# Patient Record
Sex: Female | Born: 2000 | Race: White | Hispanic: No | Smoking: Never smoker
Health system: Southern US, Community
[De-identification: ages and names within clinical notes are randomized; demographics above are authoritative.]

## PROBLEM LIST (undated history)

## (undated) DIAGNOSIS — F329 Major depressive disorder, single episode, unspecified: Secondary | ICD-10-CM

## (undated) DIAGNOSIS — F32A Depression, unspecified: Secondary | ICD-10-CM

## (undated) DIAGNOSIS — M779 Enthesopathy, unspecified: Secondary | ICD-10-CM

## (undated) DIAGNOSIS — E119 Type 2 diabetes mellitus without complications: Secondary | ICD-10-CM

## (undated) DIAGNOSIS — R002 Palpitations: Secondary | ICD-10-CM

## (undated) DIAGNOSIS — Z9889 Other specified postprocedural states: Secondary | ICD-10-CM

## (undated) DIAGNOSIS — H9319 Tinnitus, unspecified ear: Secondary | ICD-10-CM

## (undated) DIAGNOSIS — J45909 Unspecified asthma, uncomplicated: Secondary | ICD-10-CM

## (undated) DIAGNOSIS — G5600 Carpal tunnel syndrome, unspecified upper limb: Secondary | ICD-10-CM

## (undated) DIAGNOSIS — R112 Nausea with vomiting, unspecified: Secondary | ICD-10-CM

## (undated) DIAGNOSIS — F419 Anxiety disorder, unspecified: Secondary | ICD-10-CM

## (undated) DIAGNOSIS — R000 Tachycardia, unspecified: Secondary | ICD-10-CM

## (undated) DIAGNOSIS — T7840XA Allergy, unspecified, initial encounter: Secondary | ICD-10-CM

## (undated) DIAGNOSIS — K219 Gastro-esophageal reflux disease without esophagitis: Secondary | ICD-10-CM

## (undated) HISTORY — DX: Type 2 diabetes mellitus without complications: E11.9

## (undated) HISTORY — DX: Unspecified asthma, uncomplicated: J45.909

## (undated) HISTORY — DX: Carpal tunnel syndrome, unspecified upper limb: G56.00

## (undated) HISTORY — PX: WISDOM TOOTH EXTRACTION: SHX21

## (undated) HISTORY — PX: ADENOIDECTOMY: SUR15

## (undated) HISTORY — DX: Allergy, unspecified, initial encounter: T78.40XA

## (undated) HISTORY — PX: TONSILLECTOMY: SUR1361

## (undated) HISTORY — DX: Tinnitus, unspecified ear: H93.19

## (undated) HISTORY — DX: Tachycardia, unspecified: R00.0

## (undated) HISTORY — DX: Palpitations: R00.2

---
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
## 2019-01-02 ENCOUNTER — Telehealth: Payer: Self-pay

## 2019-01-02 DIAGNOSIS — F32A Depression, unspecified: Secondary | ICD-10-CM

## 2019-01-02 DIAGNOSIS — E109 Type 1 diabetes mellitus without complications: Secondary | ICD-10-CM | POA: Diagnosis not present

## 2019-01-02 DIAGNOSIS — F329 Major depressive disorder, single episode, unspecified: Secondary | ICD-10-CM

## 2019-01-02 DIAGNOSIS — F411 Generalized anxiety disorder: Secondary | ICD-10-CM

## 2019-01-02 NOTE — BH Specialist Note (Signed)
[facility] Telephone Follow-up  MRN: [id number] NAME: Linda [name] Date: 01/02/19  Start time: Start Time: 1100 End time: Stop Time: 1117 Total time: Total Time in Minutes (Visit): 17 Call number: Visit Number: 4- Fourth Visit    Reason for call today: Reason for Contact: PHQ9-8 weeks  PHQ-9 Scores:  [facility] 01/02/2019 12/19/2018 12/08/2018 12/06/2018 12/05/2018  Decreased Interest 1 2 3 3 2   Down, Depressed, Hopeless 1 2 3 3 2   PHQ - 2 Score 2 4 6 6 4   Altered sleeping 2 2 3 1 1   Tired, decreased energy 1 1 3 3  0  Change in appetite 1 1 3 3  0  Feeling bad or failure about yourself  0 1 3 3 3   Trouble concentrating 0 1 3 3 2   Moving slowly or fidgety/restless 1 0 0 0 1  Suicidal thoughts 0 0 0 0 0  PHQ-9 Score 7 10 21 19 11   Difficult doing work/chores Somewhat difficult Somewhat difficult Very difficult Extremely dIfficult Somewhat difficult  Some recent data might be hidden     GAD-7 Scores:  GAD 7 : Generalized Anxiety Score 12/19/2018 12/05/2018 11/21/2018 11/01/2018  Nervous, Anxious, on Edge 1 2 1 2   Control/stop worrying 1 2 1 2   Worry too much - different things 1 1 1 1   Trouble relaxing 1 0 0 1  Restless 0 0 1 1  Easily annoyed or irritable 0 0 0 0  Afraid - awful might happen 1 2 1  0  Total GAD 7 Score 5 7 5 7   Anxiety Difficulty Not difficult at all Somewhat difficult Somewhat difficult Somewhat difficult    Stress Current stressors: Current Stressors: (None Reported) Sleep: Sleep: Increased Appetite: Appetite: Decreased Coping ability: Coping ability: Normal Patient taking medications as prescribed: Patient taking medications as prescribed: Yes    Current medications:  Outpatient Encounter Medications as of 01/02/2019  Medication Sig  . adapalene (DIFFERIN) 0.1 % gel Apply topically at bedtime.  . AMBULATORY NON FORMULARY MEDICATION Medication Name: Nitroglycerin ointment 0.125% use pea sized amount three times a day  . busPIRone  (BUSPAR) 15 MG tablet Take 1 tablet (15 mg total) by mouth 2 (two) times daily.  . citalopram (CELEXA) 20 MG tablet Take 1 tablet (20 mg total) by mouth daily.  Marland Kitchen. dicyclomine (BENTYL) 10 MG capsule TAKE 1 CAPSULE (10 MG TOTAL) BY MOUTH 3 (THREE) TIMES DAILY BEFORE MEALS.  Marland Kitchen. EPINEPHrine (EPI-PEN) 0.3 mg/0.3 mL SOAJ injection Inject 0.3 mLs (0.3 mg total) into the muscle once. (Patient taking differently: Inject 0.3 mg into the muscle as needed for anaphylaxis. )  . glucagon (GLUCAGON EMERGENCY) 1 MG injection Inject 1 mg into the muscle once as needed (for blood sugar).   . hydrocortisone-pramoxine (PROCTOFOAM-HC) rectal foam Place 1 applicator rectally 2 (two) times daily. x7 days  . hydrOXYzine (ATARAX/VISTARIL) 25 MG tablet Take 0.5-1 tablets (12.5-25 mg total) by mouth every 8 (eight) hours as needed for anxiety (panic).  . Insulin Human (INSULIN PUMP) SOLN Inject into the skin continuous. Novolog  . LILLOW 0.15-30 MG-MCG tablet TAKE 1 TABLET BY MOUTH DAILY  . montelukast (SINGULAIR) 5 MG chewable tablet CHEW 1 TABLET (5 MG TOTAL) BY MOUTH AT BEDTIME.  Marland Kitchen. omeprazole (PRILOSEC) 20 MG capsule Take 1 capsule (20 mg total) by mouth daily.  . ondansetron (ZOFRAN ODT) 4 MG disintegrating tablet Take 1 tablet (4 mg total) by mouth every 8 (eight) hours as needed for nausea or vomiting.  .Marland Kitchen  polyethylene glycol powder (GLYCOLAX/MIRALAX) 17 GM/SCOOP powder Take 17 g by mouth 2 (two) times daily as needed.  Marland Kitchen. PROVENTIL HFA 108 (90 Base) MCG/ACT inhaler USE 1 PUFFS EVERY 6 HOURS AS NEEDED FOR WHEEZING OR SHORTNESS OF BREATH (Patient taking differently: Inhale 1 puff into the lungs every 6 (six) hours as needed for wheezing. )   No facility-administered encounter medications on file as of 01/02/2019.      Self-harm Behaviors Risk Assessment Self-harm risk factors: Self-harm risk factors: (None Reported) Patient endorses recent thoughts of harming self: Have you recently had any thoughts about harming  yourself?: No    [place] Suicide Severity Rating Scale: No flowsheet data found. C-SRSS 10/12/2018 10/24/2018 11/01/2018 11/21/2018 12/05/2018 12/19/2018 01/02/2019  1. Wish to be Dead No No No No No No No  2. Suicidal Thoughts No No No No No No No  3. Suicidal Thoughts with Method Without Specific Plan or Intent to Act No No No No No No No  4. Suicidal Intent Without Specific Plan No No No No No No No  5. Suicide Intent with Specific Plan Yes No No No No No No  6. Suicide Behavior Question No No No No No No No  7. How long ago did you do any of these? Between three months and a year ago - Within the last three months - - - -     Danger to Others Risk Assessment Danger to others risk factors: Danger to Others Risk Factors: No risk factors noted Patient endorses recent thoughts of harming others: Notification required: No need or identified person  Dynamic Appraisal of Situational Aggression (DASA): No flowsheet data found.   Substance Use Assessment Patient recently consumed alcohol:  None Reported  Alcohol Use Disorder Identification Test (AUDIT):  Alcohol Use Disorder Test (AUDIT) 10/12/2018 10/24/2018 11/01/2018 11/21/2018 12/05/2018  1. How often do you have a drink containing alcohol? 0 0 0 0 0  2. How many drinks containing alcohol do you have on a typical day when you are drinking? 0 0 0 0 0  3. How often do you have six or more drinks on one occasion? 0 0 0 0 0  AUDIT-C Score 0 0 0 0 0  Alcohol Brief Interventions/Follow-up AUDIT Score <7 follow-up not indicated AUDIT Score <7 follow-up not indicated AUDIT Score <7 follow-up not indicated AUDIT Score <7 follow-up not indicated AUDIT Score <7 follow-up not indicated   Patient recently used drugs:  None Reported  Goals, Interventions and Follow-up Plan Goals: Increase healthy adjustment to current life circumstances Interventions: Motivational Interviewing, Behavioral Activation and Supportive Counseling Follow-up Plan: [facility] Phone Follow UP    Summary:   [name] SportsCassie is a 18 year old female.  Patient has a decrease in her PHQ and GAD score.    Supportive Counseling: Brentley reports decreased depression.  Clarita reports engaging in self-care activities with her family.   Fatou reports that she has been adhering to her scheduled meal plan regarding her diabetes.   Joetta reports that she had her wisdom teeth taken out on Wednesday and she feels ill.  Yvaine reports improved sleep due to pain for her wisdom teeth.    Medication: Patient reports compliance with taking the medication. Denies any negative side effects. Still experiencing some depressive episodes.   Patient denies SI/HI/Psychosis/Substance Abuse. If your symptoms worsen or you have thoughts of suicide/homicide, PLEASE SEEK IMMEDIATE MEDICAL ATTENTION.  You may always call:  National Suicide Hotline: (813)070-9454(703)153-1083; [place] Crisis Line: 509-080-8923647-033-3200; Crisis Recovery  in [place]: 727-660-1287.  These are available 24 hours a day, 7 days a week.   During the next session: Patient will engage in self-care activities.      Graciella Freer LaVerne, LCAS-A

## 2019-01-09 ENCOUNTER — Telehealth: Payer: Self-pay

## 2019-01-09 NOTE — Telephone Encounter (Signed)
[facility] - Left Message  

## 2019-01-10 ENCOUNTER — Encounter: Payer: Self-pay | Admitting: Nutrition

## 2019-01-17 ENCOUNTER — Ambulatory Visit (INDEPENDENT_AMBULATORY_CARE_PROVIDER_SITE_OTHER): Payer: Medicaid Other | Admitting: Family Medicine

## 2019-01-17 ENCOUNTER — Telehealth: Payer: Self-pay

## 2019-01-17 DIAGNOSIS — F329 Major depressive disorder, single episode, unspecified: Secondary | ICD-10-CM

## 2019-01-17 DIAGNOSIS — L2381 Allergic contact dermatitis due to animal (cat) (dog) dander: Secondary | ICD-10-CM

## 2019-01-17 DIAGNOSIS — F32A Depression, unspecified: Secondary | ICD-10-CM

## 2019-01-17 DIAGNOSIS — T782XXA Anaphylactic shock, unspecified, initial encounter: Secondary | ICD-10-CM

## 2019-01-17 DIAGNOSIS — J302 Other seasonal allergic rhinitis: Secondary | ICD-10-CM

## 2019-01-17 DIAGNOSIS — F411 Generalized anxiety disorder: Secondary | ICD-10-CM

## 2019-01-17 MED ORDER — EPINEPHRINE 0.3 MG/0.3ML IJ SOAJ: 0.3000 mg | INTRAMUSCULAR | 0 refills | Status: DC | PRN

## 2019-01-17 MED ORDER — HYDROXYZINE HCL 25 MG PO TABS: 12.5000 mg | ORAL_TABLET | Freq: Three times a day (TID) | ORAL | 1 refills | Status: DC | PRN

## 2019-01-17 MED ORDER — CETIRIZINE HCL 10 MG PO TABS: 10.0000 mg | ORAL_TABLET | Freq: Every day | ORAL | 11 refills | Status: DC

## 2019-01-17 NOTE — Progress Notes (Signed)
Telephone visit  Subjective: CC: Hives PCP: Raliegh IpGottschalk, Murphy Duzan M, DO WUJ:[id number]:Linda [name] is a 18 y.o. female calls for telephone consult today. Patient provides verbal consent for consult held via phone.  Location of patient: home Location of provider: Working remotely from home Others present for call: mother  1. Hives Patient reports intense pruritus and development of hives when she walks from her home to her car.  These seem to be predominantly isolated to the legs but she does note that she developed some on her neck and chest if she holds cats.  Does not endorse any shortness of breath or facial swelling.  She is not used any medication for this.  She had Atarax on hand for anxiety but has not been able to find it and therefore has not tried using it as an allergy pill.  She goes on to mention that she has had some night sweating and wonders if there is something she needs to do about it.  It interferes with her sleep but she is only been getting about 2 to 3 hours of sleep per night as a result.  She is able to nap during the daytime without difficulty.  She notes she took melatonin in efforts to improve sleep but this has not helped.    ROS: Per HPI  No Known Allergies Past Medical History:  Diagnosis Date  . Allergy   . Asthma   . Carpal tunnel syndrome   . Diabetes mellitus without complication (HCC)    Type 1    Current Outpatient Medications:  .  adapalene (DIFFERIN) 0.1 % gel, Apply topically at bedtime., Disp: 45 g, Rfl: 6 .  AMBULATORY NON FORMULARY MEDICATION, Medication Name: Nitroglycerin ointment 0.125% use pea sized amount three times a day, Disp: 30 g, Rfl: 1 .  busPIRone (BUSPAR) 15 MG tablet, Take 1 tablet (15 mg total) by mouth 2 (two) times daily., Disp: 60 tablet, Rfl: 1 .  citalopram (CELEXA) 20 MG tablet, Take 1 tablet (20 mg total) by mouth daily., Disp: 30 tablet, Rfl: 2 .  dicyclomine (BENTYL) 10 MG capsule, TAKE 1 CAPSULE (10 MG TOTAL) BY MOUTH 3  (THREE) TIMES DAILY BEFORE MEALS., Disp: 90 capsule, Rfl: 0 .  EPINEPHrine (EPI-PEN) 0.3 mg/0.3 mL SOAJ injection, Inject 0.3 mLs (0.3 mg total) into the muscle once. (Patient taking differently: Inject 0.3 mg into the muscle as needed for anaphylaxis. ), Disp: 2 Device, Rfl: 1 .  glucagon (GLUCAGON EMERGENCY) 1 MG injection, Inject 1 mg into the muscle once as needed (for blood sugar). , Disp: , Rfl:  .  hydrocortisone-pramoxine (PROCTOFOAM-HC) rectal foam, Place 1 applicator rectally 2 (two) times daily. x7 days, Disp: 10 g, Rfl: 0 .  hydrOXYzine (ATARAX/VISTARIL) 25 MG tablet, Take 0.5-1 tablets (12.5-25 mg total) by mouth every 8 (eight) hours as needed for anxiety (panic)., Disp: 30 tablet, Rfl: 1 .  Insulin Human (INSULIN PUMP) SOLN, Inject into the skin continuous. Novolog, Disp: , Rfl:  .  LILLOW 0.15-30 MG-MCG tablet, TAKE 1 TABLET BY MOUTH DAILY, Disp: 84 tablet, Rfl: 1 .  montelukast (SINGULAIR) 5 MG chewable tablet, CHEW 1 TABLET (5 MG TOTAL) BY MOUTH AT BEDTIME., Disp: 30 tablet, Rfl: 5 .  omeprazole (PRILOSEC) 20 MG capsule, Take 1 capsule (20 mg total) by mouth daily., Disp: 7 capsule, Rfl: 0 .  ondansetron (ZOFRAN ODT) 4 MG disintegrating tablet, Take 1 tablet (4 mg total) by mouth every 8 (eight) hours as needed for nausea or vomiting., Disp: 20  tablet, Rfl: 0 .  polyethylene glycol powder (GLYCOLAX/MIRALAX) 17 GM/SCOOP powder, Take 17 g by mouth 2 (two) times daily as needed., Disp: 3350 g, Rfl: 3 .  PROVENTIL HFA 108 (90 Base) MCG/ACT inhaler, USE 1 PUFFS EVERY 6 HOURS AS NEEDED FOR WHEEZING OR SHORTNESS OF BREATH (Patient taking differently: Inhale 1 puff into the lungs every 6 (six) hours as needed for wheezing. ), Disp: 6.7 Inhaler, Rfl: 2  Assessment/ Plan: 18 y.o. female   1. Allergic contact dermatitis due to animal (cat) (dog) dander Zyrtec nightly.  May use Atarax as needed breakthrough itching.  May need to consider adding Singulair back to her regimen.  We discussed that  if symptoms or not improving or if they got worse we could have her see an allergist or dermatologist. - hydrOXYzine (ATARAX/VISTARIL) 25 MG tablet; Take 0.5-1 tablets (12.5-25 mg total) by mouth every 8 (eight) hours as needed for anxiety or itching.  Dispense: 30 tablet; Refill: 1 - cetirizine (ZYRTEC) 10 MG tablet; Take 1 tablet (10 mg total) by mouth daily.  Dispense: 30 tablet; Refill: 11  2. Seasonal allergies As above - hydrOXYzine (ATARAX/VISTARIL) 25 MG tablet; Take 0.5-1 tablets (12.5-25 mg total) by mouth every 8 (eight) hours as needed for anxiety or itching.  Dispense: 30 tablet; Refill: 1 - cetirizine (ZYRTEC) 10 MG tablet; Take 1 tablet (10 mg total) by mouth daily.  Dispense: 30 tablet; Refill: 11  3. Anaphylaxis, initial encounter Occurred greater than 5 years ago.  Her epinephrine is out of date.  If sent in a new device - EPINEPHrine 0.3 mg/0.3 mL IJ SOAJ injection; Inject 0.3 mLs (0.3 mg total) into the muscle as needed for anaphylaxis.  Dispense: 1 each; Refill: 0  Additionally with regards to night sweats, I did recommend that she seek care in office or with her endocrinologist to have thyroid testing done to rule out hyperthyroidism as a cause.  We discussed that she could use the Atarax at bedtime if needed to help with sleep.  Avoid daytime napping  Start time: 10:27am End time: 10:35am  Total time spent on patient care (including telephone call/ virtual visit): 14 minutes  [place], [place] [phone]

## 2019-01-17 NOTE — BH Specialist Note (Signed)
[facility] Telephone Follow-up  MRN: [id number] NAME: Linda [name] Date: 01/17/19  Start time: Start Time: 1000 End time: Stop Time: 1030 Total time: Total Time in Minutes (Visit): 30 Call number: Visit Number: 4- Fourth Visit   Reason for call today: Reason for Contact: PHQ9-8 weeks   PHQ-9 Scores:  Depression screen [facility] 2/9 01/17/2019 01/02/2019 12/19/2018 12/08/2018 12/06/2018  Decreased Interest 1 1 2 3 3   Down, Depressed, Hopeless 1 1 2 3 3   PHQ - 2 Score 2 2 4 6 6   Altered sleeping 2 2 2 3 1   Tired, decreased energy 0 1 1 3 3   Change in appetite 0 1 1 3 3   Feeling bad or failure about yourself  1 0 1 3 3   Trouble concentrating 0 0 1 3 3   Moving slowly or fidgety/restless 0 1 0 0 0  Suicidal thoughts 0 0 0 0 0  PHQ-9 Score 5 7 10 21 19   Difficult doing work/chores - Somewhat difficult Somewhat difficult Very difficult Extremely dIfficult  Some recent data might be hidden     GAD-7 Scores:  GAD 7 : Generalized Anxiety Score 01/17/2019 12/19/2018 12/05/2018 11/21/2018  Nervous, Anxious, on Edge 1 1 2 1   Control/stop worrying 1 1 2 1   Worry too much - different things 1 1 1 1   Trouble relaxing 0 1 0 0  Restless 0 0 0 1  Easily annoyed or irritable 0 0 0 0  Afraid - awful might happen 2 1 2 1   Total GAD 7 Score 5 5 7 5   Anxiety Difficulty Not difficult at all Not difficult at all Somewhat difficult Somewhat difficult    Stress Current stressors: Current Stressors: (Worried about the increase of the Corona Virus) Having issues sleeping. Sleep: Sleep: Difficulty falling asleep, Decreased, Difficulty staying asleep Appetite: Appetite: No problems Coping ability: Coping ability: Normal Patient taking medications as prescribed: Patient taking medications as prescribed: Yes    Current medications:  Outpatient Encounter Medications as of 01/17/2019  Medication Sig  . adapalene (DIFFERIN) 0.1 % gel Apply topically at bedtime.  . AMBULATORY NON FORMULARY MEDICATION  Medication Name: Nitroglycerin ointment 0.125% use pea sized amount three times a day  . busPIRone (BUSPAR) 15 MG tablet Take 1 tablet (15 mg total) by mouth 2 (two) times daily.  . cetirizine (ZYRTEC) 10 MG tablet Take 1 tablet (10 mg total) by mouth daily.  . citalopram (CELEXA) 20 MG tablet Take 1 tablet (20 mg total) by mouth daily.  Marland Kitchen. dicyclomine (BENTYL) 10 MG capsule TAKE 1 CAPSULE (10 MG TOTAL) BY MOUTH 3 (THREE) TIMES DAILY BEFORE MEALS.  Marland Kitchen. EPINEPHrine 0.3 mg/0.3 mL IJ SOAJ injection Inject 0.3 mLs (0.3 mg total) into the muscle as needed for anaphylaxis.  Marland Kitchen. glucagon (GLUCAGON EMERGENCY) 1 MG injection Inject 1 mg into the muscle once as needed (for blood sugar).   . hydrocortisone-pramoxine (PROCTOFOAM-HC) rectal foam Place 1 applicator rectally 2 (two) times daily. x7 days  . hydrOXYzine (ATARAX/VISTARIL) 25 MG tablet Take 0.5-1 tablets (12.5-25 mg total) by mouth every 8 (eight) hours as needed for anxiety or itching.  . Insulin Human (INSULIN PUMP) SOLN Inject into the skin continuous. Novolog  . LILLOW 0.15-30 MG-MCG tablet TAKE 1 TABLET BY MOUTH DAILY  . omeprazole (PRILOSEC) 20 MG capsule Take 1 capsule (20 mg total) by mouth daily.  . ondansetron (ZOFRAN ODT) 4 MG disintegrating tablet Take 1 tablet (4 mg total) by mouth every 8 (eight) hours as needed for  nausea or vomiting.  . polyethylene glycol powder (GLYCOLAX/MIRALAX) 17 GM/SCOOP powder Take 17 g by mouth 2 (two) times daily as needed.  Marland Kitchen PROVENTIL HFA 108 (90 Base) MCG/ACT inhaler USE 1 PUFFS EVERY 6 HOURS AS NEEDED FOR WHEEZING OR SHORTNESS OF BREATH (Patient taking differently: Inhale 1 puff into the lungs every 6 (six) hours as needed for wheezing. )   No facility-administered encounter medications on file as of 2019-02-14.      Self-harm Behaviors Risk Assessment Self-harm risk factors: Self-harm risk factors: (None Reported) Patient endorses recent thoughts of harming self: Have you recently had any thoughts about  harming yourself?: No    [place] Suicide Severity Rating Scale: No flowsheet data found. C-SRSS 10/24/2018 11/01/2018 11/21/2018 12/05/2018 12/19/2018 01/02/2019 Feb 14, 2019  1. Wish to be Dead No No No No No No No  2. Suicidal Thoughts No No No No No No No  3. Suicidal Thoughts with Method Without Specific Plan or Intent to Act No No No No No No No  4. Suicidal Intent Without Specific Plan No No No No No No No  5. Suicide Intent with Specific Plan No No No No No No No  6. Suicide Behavior Question No No No No No No No  7. How long ago did you do any of these? - Within the last three months - - - - -     Danger to Others Risk Assessment Danger to others risk factors: Danger to Others Risk Factors: No risk factors noted Patient endorses recent thoughts of harming others: Notification required: No need or identified person     Substance Use Assessment Patient recently consumed alcohol:  None Reported  Alcohol Use Disorder Identification Test (AUDIT):  Alcohol Use Disorder Test (AUDIT) 10/12/2018 10/24/2018 11/01/2018 11/21/2018 12/05/2018  1. How often do you have a drink containing alcohol? 0 0 0 0 0  2. How many drinks containing alcohol do you have on a typical day when you are drinking? 0 0 0 0 0  3. How often do you have six or more drinks on one occasion? 0 0 0 0 0  AUDIT-C Score 0 0 0 0 0  Alcohol Brief Interventions/Follow-up AUDIT Score <7 follow-up not indicated AUDIT Score <7 follow-up not indicated AUDIT Score <7 follow-up not indicated AUDIT Score <7 follow-up not indicated AUDIT Score <7 follow-up not indicated   Patient recently used drugs:  None Reported    Goals, Interventions and Follow-up Plan Goals: Increase healthy adjustment to current life circumstances Interventions: Supportive Counseling and Sleep Hygiene Follow-up Plan: [facility] Phone Follow UP   Summary:   Patient is an 18 year old female.   Patient has a decrease in her PHQ to 5 and her GAD score remains at 5.  Patient  reports that since she has been taking her medication as prescribed, she has felt a difference in her mood.  Patient reports that she feels better when she is able to spend time with her parents.  Patient reports that her mother started a new job and she has been taking her mother to and from work all week.    Writer praised patient for establishing and maintaining her exercising routine with her sister.  Patient reports that she has improved her diet and she is doing much better with managing her diabetes.  Patient reports that her grandmother is assisting with ensuring that she has a better selection of healthy food in the home.   Patient reports that her anxiety level has decreased  because she has made the decision to sit out this semester due to the Corona Virus.  Patient reports that she wants to make sure that her elderly grandparents are not exposed to [facility] Virus by not attending college in August.     Patient reports that she has been having issues with sleeping at night.  Patient reports that she has been experiencing cold sweats.  Writer discussed sleep hygiene techniques.  Patient reports that she has had problems sleeping at night for most of her life.  Patient reports that she has been taking melatonin gummies, but she still is not able to sleep at night.     Medication: Patient reports compliance with taking the medication. Denies any negative side effects.    Patient denies SI/HI/Psychosis/Substance Abuse. If your symptoms worsen or you have thoughts of suicide/homicide, PLEASE SEEK IMMEDIATE MEDICAL ATTENTION.  You may always call:  National Suicide Hotline: (517)513-7935512-775-6164; [place] Crisis Line: 213-382-1332847-252-7838; [place]: 325-276-7206423-337-6055.  These are available 24 hours a day, 7 days a week.   During the next session: patient will continue to engage in self-care activities and sleep hygiene techniques.      Phillip HealStevenson, Marce Charlesworth LaVerne, LCAS-A

## 2019-01-17 NOTE — Patient Instructions (Signed)

## 2019-01-30 ENCOUNTER — Telehealth: Payer: Self-pay

## 2019-01-30 NOTE — Telephone Encounter (Signed)
[facility] - Left Message  

## 2019-02-07 ENCOUNTER — Telehealth: Payer: Self-pay

## 2019-02-07 NOTE — Telephone Encounter (Signed)
2nd attempt - [facility]   

## 2019-02-09 DIAGNOSIS — E109 Type 1 diabetes mellitus without complications: Secondary | ICD-10-CM | POA: Diagnosis not present

## 2019-02-12 ENCOUNTER — Ambulatory Visit (INDEPENDENT_AMBULATORY_CARE_PROVIDER_SITE_OTHER): Payer: Medicaid Other | Admitting: Family Medicine

## 2019-02-12 DIAGNOSIS — F32A Depression, unspecified: Secondary | ICD-10-CM

## 2019-02-12 DIAGNOSIS — F410 Panic disorder [episodic paroxysmal anxiety] without agoraphobia: Secondary | ICD-10-CM | POA: Diagnosis not present

## 2019-02-12 DIAGNOSIS — N926 Irregular menstruation, unspecified: Secondary | ICD-10-CM | POA: Diagnosis not present

## 2019-02-12 DIAGNOSIS — G4710 Hypersomnia, unspecified: Secondary | ICD-10-CM

## 2019-02-12 DIAGNOSIS — F329 Major depressive disorder, single episode, unspecified: Secondary | ICD-10-CM | POA: Diagnosis not present

## 2019-02-12 MED ORDER — LEVONORGESTREL-ETHINYL ESTRAD 0.15-30 MG-MCG PO TABS: 1.0000 | ORAL_TABLET | Freq: Every day | ORAL | 3 refills | Status: DC

## 2019-02-12 MED ORDER — BUSPIRONE HCL 15 MG PO TABS: 15.0000 mg | ORAL_TABLET | Freq: Two times a day (BID) | ORAL | 3 refills | Status: DC

## 2019-02-12 MED ORDER — CITALOPRAM HYDROBROMIDE 20 MG PO TABS: 20.0000 mg | ORAL_TABLET | Freq: Every day | ORAL | 12 refills | Status: DC

## 2019-02-12 MED ORDER — FEXOFENADINE HCL 180 MG PO TABS: 180.0000 mg | ORAL_TABLET | Freq: Every day | ORAL | 5 refills | Status: DC

## 2019-02-12 NOTE — Progress Notes (Signed)
Telephone visit  Subjective: CC: GAD/ depression, allergies PCP: Linda Norlander, DO ZOX:WRUEAV Linda [name] is a 18 y.o. female calls for telephone consult today. Patient provides verbal consent for consult held via phone.  Location of patient: home Location of provider: Working remotely from home Others present for call: parents  1.  Anxiety disorder/depressive disorder Patient reports good control with Celexa and buspirone.  She does report some increased fatigue since starting the Zyrtec.  She notes that despite plenty of sleep per night she still feels daytime grogginess.  She does report that she is on her menstrual cycle now which is probably contributing as well.  She would be amenable to alternative antihistamine if it meant that she would have better energy.  She goes on to state that she needs refills of her OCP.  She has been compliant with the medication and has not missed any doses.   ROS: Per HPI  No Known Allergies Past Medical History:  Diagnosis Date  . Allergy   . Asthma   . Carpal tunnel syndrome   . Diabetes mellitus without complication (HCC)    Type 1    Current Outpatient Medications:  .  adapalene (DIFFERIN) 0.1 % gel, Apply topically at bedtime., Disp: 45 g, Rfl: 6 .  AMBULATORY NON FORMULARY MEDICATION, Medication Name: Nitroglycerin ointment 0.125% use pea sized amount three times a day, Disp: 30 g, Rfl: 1 .  busPIRone (BUSPAR) 15 MG tablet, Take 1 tablet (15 mg total) by mouth 2 (two) times daily., Disp: 60 tablet, Rfl: 1 .  cetirizine (ZYRTEC) 10 MG tablet, Take 1 tablet (10 mg total) by mouth daily., Disp: 30 tablet, Rfl: 11 .  citalopram (CELEXA) 20 MG tablet, Take 1 tablet (20 mg total) by mouth daily., Disp: 30 tablet, Rfl: 2 .  dicyclomine (BENTYL) 10 MG capsule, TAKE 1 CAPSULE (10 MG TOTAL) BY MOUTH 3 (THREE) TIMES DAILY BEFORE MEALS., Disp: 90 capsule, Rfl: 0 .  EPINEPHrine 0.3 mg/0.3 mL IJ SOAJ injection, Inject 0.3 mLs (0.3 mg total) into the  muscle as needed for anaphylaxis., Disp: 1 each, Rfl: 0 .  glucagon (GLUCAGON EMERGENCY) 1 MG injection, Inject 1 mg into the muscle once as needed (for blood sugar). , Disp: , Rfl:  .  hydrocortisone-pramoxine (PROCTOFOAM-HC) rectal foam, Place 1 applicator rectally 2 (two) times daily. x7 days, Disp: 10 g, Rfl: 0 .  hydrOXYzine (ATARAX/VISTARIL) 25 MG tablet, Take 0.5-1 tablets (12.5-25 mg total) by mouth every 8 (eight) hours as needed for anxiety or itching., Disp: 30 tablet, Rfl: 1 .  Insulin Human (INSULIN PUMP) SOLN, Inject into the skin continuous. Novolog, Disp: , Rfl:  .  LILLOW 0.15-30 MG-MCG tablet, TAKE 1 TABLET BY MOUTH DAILY, Disp: 84 tablet, Rfl: 1 .  omeprazole (PRILOSEC) 20 MG capsule, Take 1 capsule (20 mg total) by mouth daily., Disp: 7 capsule, Rfl: 0 .  ondansetron (ZOFRAN ODT) 4 MG disintegrating tablet, Take 1 tablet (4 mg total) by mouth every 8 (eight) hours as needed for nausea or vomiting., Disp: 20 tablet, Rfl: 0 .  polyethylene glycol powder (GLYCOLAX/MIRALAX) 17 GM/SCOOP powder, Take 17 g by mouth 2 (two) times daily as needed., Disp: 3350 g, Rfl: 3 .  PROVENTIL HFA 108 (90 Base) MCG/ACT inhaler, USE 1 PUFFS EVERY 6 HOURS AS NEEDED FOR WHEEZING OR SHORTNESS OF BREATH (Patient taking differently: Inhale 1 puff into the lungs every 6 (six) hours as needed for wheezing. ), Disp: 6.7 Inhaler, Rfl: 2  Patient's last menstrual period  was 02/12/2019.  Assessment/ Plan: 18 y.o. female   1. Irregular periods Stable.  Doing well with current OCP.  This is been renewed. - levonorgestrel-ethinyl estradiol (LILLOW) 0.15-30 MG-MCG tablet; Take 1 tablet by mouth daily.  Dispense: 84 tablet; Refill: 3  2. Depressive disorder Stable.  Renewal of Celexa and buspirone sent - citalopram (CELEXA) 20 MG tablet; Take 1 tablet (20 mg total) by mouth daily.  Dispense: 30 tablet; Refill: 12 - busPIRone (BUSPAR) 15 MG tablet; Take 1 tablet (15 mg total) by mouth 2 (two) times daily.   Dispense: 60 tablet; Refill: 3  3. Panic attack Stable. - citalopram (CELEXA) 20 MG tablet; Take 1 tablet (20 mg total) by mouth daily.  Dispense: 30 tablet; Refill: 12 - busPIRone (BUSPAR) 15 MG tablet; Take 1 tablet (15 mg total) by mouth 2 (two) times daily.  Dispense: 60 tablet; Refill: 3  4. Excessive sleepiness Presumably related to Zyrtec use.  This is been discontinued and replaced with Allegra.  We discussed that this may not be covered by her insurance but okay to obtain a OTC if needed.  Differential diagnoses include excessive sleepiness secondary to anemia from ongoing menstrual cycle versus uncontrolled BG's causing excessive sleepiness.   Start time: 1:20pm End time: 1:24pm  Total time spent on patient care (including telephone call/ virtual visit): 10 minutes  Linda [name] Linda SkainsM Francisca Langenderfer, DO [facility] [phone]

## 2019-02-13 ENCOUNTER — Telehealth: Payer: Self-pay | Admitting: *Deleted

## 2019-02-13 ENCOUNTER — Telehealth: Payer: Self-pay

## 2019-02-13 MED ORDER — LORATADINE 10 MG PO TABS: 10.0000 mg | ORAL_TABLET | Freq: Every day | ORAL | 11 refills | Status: DC

## 2019-02-13 NOTE — Telephone Encounter (Signed)
Claritin sent in and pt is aware we had to send in to take the place of Allegra.

## 2019-02-13 NOTE — Telephone Encounter (Signed)
Allegra is non preferred for her insurance. She must try and fail 2 preferred medications first. She has already tried Zyrtec but it caused increased tiredness.   The other 2 preferred options are Xyzal and Claritin.

## 2019-02-13 NOTE — Telephone Encounter (Signed)
[facility] - Left Message  

## 2019-02-13 NOTE — Telephone Encounter (Signed)
Ok to replace with Clairitin 10mg  daily. She was too sedated with Zyrtec so Xyzal likely to cause similar drowsiness.

## 2019-02-21 ENCOUNTER — Telehealth: Payer: Self-pay

## 2019-02-21 NOTE — Telephone Encounter (Signed)
[facility] Inactive - Several attempts have been made to contact patient without success. Patient will be placed on the inactive list.  If services are needed again.  Please contact [facility] at [phone].    Information will be routed to the PCP and Dr. Hisada  

## 2019-03-14 ENCOUNTER — Ambulatory Visit (INDEPENDENT_AMBULATORY_CARE_PROVIDER_SITE_OTHER): Payer: Medicaid Other | Admitting: Family Medicine

## 2019-03-14 DIAGNOSIS — L989 Disorder of the skin and subcutaneous tissue, unspecified: Secondary | ICD-10-CM | POA: Diagnosis not present

## 2019-03-14 DIAGNOSIS — H0289 Other specified disorders of eyelid: Secondary | ICD-10-CM | POA: Diagnosis not present

## 2019-03-14 NOTE — Progress Notes (Signed)
Video visit  Subjective: CC: spot on eyelid PCP: Raliegh IpGottschalk, Ashly M, DO ZOX:WRUEAVHPI:Linda [name] is a 18 y.o. female calls for video consult today. Patient provides verbal consent for consult held via video.  Location of patient: home Location of provider: Working remotely from home Others present for call: mother  1. Spot on eyelid Reports that she developed a spot on her right upper eyelid about 2 weeks ago, kind of near the crease that is red and rough.  She has been applying moisturizers to the affected area but it is not helping.  She is no longer wearing eye makeup as a result.  She denies pain or significant swelling.  She reports irritation.  She reports vision is normal.  Reports increased tear production.   2. Bump Reports a small bump on left side of jaw for last several years.  She reports it seems to be getting bigger.  Sometimes it's irritating but does not overtly hurt.  No drainage.  ROS: Per HPI  No Known Allergies Past Medical History:  Diagnosis Date  . Allergy   . Asthma   . Carpal tunnel syndrome   . Diabetes mellitus without complication (HCC)    Type 1    Current Outpatient Medications:  .  adapalene (DIFFERIN) 0.1 % gel, Apply topically at bedtime., Disp: 45 g, Rfl: 6 .  AMBULATORY NON FORMULARY MEDICATION, Medication Name: Nitroglycerin ointment 0.125% use pea sized amount three times a day, Disp: 30 g, Rfl: 1 .  busPIRone (BUSPAR) 15 MG tablet, Take 1 tablet (15 mg total) by mouth 2 (two) times daily., Disp: 60 tablet, Rfl: 3 .  citalopram (CELEXA) 20 MG tablet, Take 1 tablet (20 mg total) by mouth daily., Disp: 30 tablet, Rfl: 12 .  dicyclomine (BENTYL) 10 MG capsule, TAKE 1 CAPSULE (10 MG TOTAL) BY MOUTH 3 (THREE) TIMES DAILY BEFORE MEALS., Disp: 90 capsule, Rfl: 0 .  EPINEPHrine 0.3 mg/0.3 mL IJ SOAJ injection, Inject 0.3 mLs (0.3 mg total) into the muscle as needed for anaphylaxis., Disp: 1 each, Rfl: 0 .  glucagon (GLUCAGON EMERGENCY) 1 MG injection,  Inject 1 mg into the muscle once as needed (for blood sugar). , Disp: , Rfl:  .  hydrocortisone-pramoxine (PROCTOFOAM-HC) rectal foam, Place 1 applicator rectally 2 (two) times daily. x7 days, Disp: 10 g, Rfl: 0 .  hydrOXYzine (ATARAX/VISTARIL) 25 MG tablet, Take 0.5-1 tablets (12.5-25 mg total) by mouth every 8 (eight) hours as needed for anxiety or itching., Disp: 30 tablet, Rfl: 1 .  Insulin Human (INSULIN PUMP) SOLN, Inject into the skin continuous. Novolog, Disp: , Rfl:  .  levonorgestrel-ethinyl estradiol (LILLOW) 0.15-30 MG-MCG tablet, Take 1 tablet by mouth daily., Disp: 84 tablet, Rfl: 3 .  loratadine (CLARITIN) 10 MG tablet, Take 1 tablet (10 mg total) by mouth daily., Disp: 30 tablet, Rfl: 11 .  omeprazole (PRILOSEC) 20 MG capsule, Take 1 capsule (20 mg total) by mouth daily., Disp: 7 capsule, Rfl: 0 .  ondansetron (ZOFRAN ODT) 4 MG disintegrating tablet, Take 1 tablet (4 mg total) by mouth every 8 (eight) hours as needed for nausea or vomiting., Disp: 20 tablet, Rfl: 0 .  polyethylene glycol powder (GLYCOLAX/MIRALAX) 17 GM/SCOOP powder, Take 17 g by mouth 2 (two) times daily as needed., Disp: 3350 g, Rfl: 3 .  PROVENTIL HFA 108 (90 Base) MCG/ACT inhaler, USE 1 PUFFS EVERY 6 HOURS AS NEEDED FOR WHEEZING OR SHORTNESS OF BREATH (Patient taking differently: Inhale 1 puff into the lungs every 6 (six) hours  as needed for wheezing. ), Disp: 6.7 Inhaler, Rfl: 2  Assessment/ Plan: 18 y.o. female   1. Irritation of eyelid Likely irritation from excessive tearing versus make-up.  Advised to treat conservatively as a dermatitis.  Okay to mix a tiny amount of over-the-counter cortisone cream and with her moisturizer and apply to the affected area.  Avoid the eyeball.  We discussed consideration for antihistamine eyedrops but she would like to proceed with creams first.  We discussed signs and symptoms of infection or other issues needing evaluation in the office.  She voiced good understanding.  2.  Bumps on skin The bump that she describes sounds like a cyst.  I offered referral to dermatology versus waiting to be evaluated physically in the office and she will wait to be seen in the office.  We discussed reasons that would need more urgent evaluation she voiced good understanding.   Start time: 11:07an End time: 11:18am  Total time spent on patient care (including telephone call/ virtual visit): 15 minutes  [place], [place] [phone]

## 2019-03-14 NOTE — Patient Instructions (Signed)
Epidermal Cyst  An epidermal cyst is a sac made of skin tissue. The sac contains a substance called keratin. Keratin is a protein that is normally secreted through the hair follicles. When keratin becomes trapped in the top layer of skin (epidermis), it can form an epidermal cyst. Epidermal cysts can be found anywhere on your body. These cysts are usually harmless (benign), and they may not cause symptoms unless they become infected. What are the causes? This condition may be caused by:  A blocked hair follicle.  A hair that curls and re-enters the skin instead of growing straight out of the skin (ingrown hair).  A blocked pore.  Irritated skin.  An injury to the skin.  Certain conditions that are passed along from parent to child (inherited).  Human papillomavirus (HPV).  Long-term (chronic) sun damage to the skin. What increases the risk? The following factors may make you more likely to develop an epidermal cyst:  Having acne.  Being overweight.  Being 30-40 years old. What are the signs or symptoms? The only symptom of this condition may be a small, painless lump underneath the skin. When an epidermal cyst ruptures, it may become infected. Symptoms may include:  Redness.  Inflammation.  Tenderness.  Warmth.  Fever.  Keratin draining from the cyst. Keratin is grayish-white, bad-smelling substance.  Pus draining from the cyst. How is this diagnosed? This condition is diagnosed with a physical exam.  In some cases, you may have a sample of tissue (biopsy) taken from your cyst to be examined under a microscope or tested for bacteria.  You may be referred to a health care provider who specializes in skin care (dermatologist). How is this treated? In many cases, epidermal cysts go away on their own without treatment. If a cyst becomes infected, treatment may include:  Opening and draining the cyst, done by a health care provider. After draining, minor surgery to  remove the rest of the cyst may be done.  Antibiotic medicine.  Injections of medicines (steroids) that help to reduce inflammation.  Surgery to remove the cyst. Surgery may be done if the cyst: ? Becomes large. ? Bothers you. ? Has a chance of turning into cancer.  Do not try to open a cyst yourself. Follow these instructions at home:  Take over-the-counter and prescription medicines only as told by your health care provider.  If you were prescribed an antibiotic medicine, take it it as told by your health care provider. Do not stop using the antibiotic even if you start to feel better.  Keep the area around your cyst clean and dry.  Wear loose, dry clothing.  Avoid touching your cyst.  Check your cyst every day for signs of infection. Check for: ? Redness, swelling, or pain. ? Fluid or blood. ? Warmth. ? Pus or a bad smell.  Keep all follow-up visits as told by your health care provider. This is important. How is this prevented?  Wear clean, dry, clothing.  Avoid wearing tight clothing.  Keep your skin clean and dry. Take showers or baths every day. Contact a health care provider if:  Your cyst develops symptoms of infection.  Your condition is not improving or is getting worse.  You develop a cyst that looks different from other cysts you have had.  You have a fever. Get help right away if:  Redness spreads from the cyst into the surrounding area. Summary  An epidermal cyst is a sac made of skin tissue. These cysts are   usually harmless (benign), and they may not cause symptoms unless they become infected.  If a cyst becomes infected, treatment may include surgery to open and drain the cyst, or to remove it. Treatment may also include medicines by mouth or through an injection.  Take over-the-counter and prescription medicines only as told by your health care provider. If you were prescribed an antibiotic medicine, take it as told by your health care  provider. Do not stop using the antibiotic even if you start to feel better.  Contact a health care provider if your condition is not improving or is getting worse.  Keep all follow-up visits as told by your health care provider. This is important. This information is not intended to replace advice given to you by your health care provider. Make sure you discuss any questions you have with your health care provider. Document Released: 05/22/2004 Document Revised: 10/12/2018 Document Reviewed: 01/02/2018 Elsevier Patient Education  2020 [employer]  

## 2019-03-27 ENCOUNTER — Ambulatory Visit (INDEPENDENT_AMBULATORY_CARE_PROVIDER_SITE_OTHER): Payer: Medicaid Other | Admitting: Family Medicine

## 2019-03-27 ENCOUNTER — Other Ambulatory Visit: Payer: Self-pay

## 2019-03-27 DIAGNOSIS — H01134 Eczematous dermatitis of left upper eyelid: Secondary | ICD-10-CM

## 2019-03-27 DIAGNOSIS — H01135 Eczematous dermatitis of left lower eyelid: Secondary | ICD-10-CM

## 2019-03-27 DIAGNOSIS — H01131 Eczematous dermatitis of right upper eyelid: Secondary | ICD-10-CM | POA: Diagnosis not present

## 2019-03-27 DIAGNOSIS — H01132 Eczematous dermatitis of right lower eyelid: Secondary | ICD-10-CM | POA: Diagnosis not present

## 2019-03-27 MED ORDER — EUCRISA 2 % EX OINT: 1.0000 "application " | TOPICAL_OINTMENT | Freq: Two times a day (BID) | CUTANEOUS | 0 refills | Status: DC

## 2019-03-27 NOTE — Progress Notes (Signed)
Telephone visit  Subjective: CC: eyelid irritation PCP: Janora Norlander, DO ALP:[id number] Linda [name] is a 18 y.o. female calls for telephone consult today. Patient provides verbal consent for consult held via phone.  Location of patient: home Location of provider: Working remotely from home Others present for call: mom  1. Eyelid rash Patient with persistent rash on her right upper eyelid that is now been going on for greater than 4 weeks.  She now notes that it is on the opposite eyelid of both upper and lower lids.  She notes that the rash is refractory to hydrocortisone cream and moisturization.   ROS: Per HPI  No Known Allergies Past Medical History:  Diagnosis Date  . Allergy   . Asthma   . Carpal tunnel syndrome   . Diabetes mellitus without complication (HCC)    Type 1    Current Outpatient Medications:  .  adapalene (DIFFERIN) 0.1 % gel, Apply topically at bedtime., Disp: 45 g, Rfl: 6 .  AMBULATORY NON FORMULARY MEDICATION, Medication Name: Nitroglycerin ointment 0.125% use pea sized amount three times a day, Disp: 30 g, Rfl: 1 .  busPIRone (BUSPAR) 15 MG tablet, Take 1 tablet (15 mg total) by mouth 2 (two) times daily., Disp: 60 tablet, Rfl: 3 .  citalopram (CELEXA) 20 MG tablet, Take 1 tablet (20 mg total) by mouth daily., Disp: 30 tablet, Rfl: 12 .  dicyclomine (BENTYL) 10 MG capsule, TAKE 1 CAPSULE (10 MG TOTAL) BY MOUTH 3 (THREE) TIMES DAILY BEFORE MEALS., Disp: 90 capsule, Rfl: 0 .  EPINEPHrine 0.3 mg/0.3 mL IJ SOAJ injection, Inject 0.3 mLs (0.3 mg total) into the muscle as needed for anaphylaxis., Disp: 1 each, Rfl: 0 .  glucagon (GLUCAGON EMERGENCY) 1 MG injection, Inject 1 mg into the muscle once as needed (for blood sugar). , Disp: , Rfl:  .  hydrocortisone-pramoxine (PROCTOFOAM-HC) rectal foam, Place 1 applicator rectally 2 (two) times daily. x7 days, Disp: 10 g, Rfl: 0 .  hydrOXYzine (ATARAX/VISTARIL) 25 MG tablet, Take 0.5-1 tablets (12.5-25 mg total) by mouth  every 8 (eight) hours as needed for anxiety or itching., Disp: 30 tablet, Rfl: 1 .  Insulin Human (INSULIN PUMP) SOLN, Inject into the skin continuous. Novolog, Disp: , Rfl:  .  levonorgestrel-ethinyl estradiol (LILLOW) 0.15-30 MG-MCG tablet, Take 1 tablet by mouth daily., Disp: 84 tablet, Rfl: 3 .  loratadine (CLARITIN) 10 MG tablet, Take 1 tablet (10 mg total) by mouth daily., Disp: 30 tablet, Rfl: 11 .  omeprazole (PRILOSEC) 20 MG capsule, Take 1 capsule (20 mg total) by mouth daily., Disp: 7 capsule, Rfl: 0 .  ondansetron (ZOFRAN ODT) 4 MG disintegrating tablet, Take 1 tablet (4 mg total) by mouth every 8 (eight) hours as needed for nausea or vomiting., Disp: 20 tablet, Rfl: 0 .  polyethylene glycol powder (GLYCOLAX/MIRALAX) 17 GM/SCOOP powder, Take 17 g by mouth 2 (two) times daily as needed., Disp: 3350 g, Rfl: 3 .  PROVENTIL HFA 108 (90 Base) MCG/ACT inhaler, USE 1 PUFFS EVERY 6 HOURS AS NEEDED FOR WHEEZING OR SHORTNESS OF BREATH (Patient taking differently: Inhale 1 puff into the lungs every 6 (six) hours as needed for wheezing. ), Disp: 6.7 Inhaler, Rfl: 2  Assessment/ Plan: 18 y.o. female    1. Eczematous dermatitis of upper and lower eyelids of both eyes Given failure of hydrocortisone cream and inability to tolerate high potency given this very sensitive area I have prescribed her Eucrisa to apply twice daily to the affected area.  If no  response or worsening, will plan referral to dermatology. - Crisaborole (EUCRISA) 2 % OINT; Apply 1 application topically 2 (two) times daily.  Dispense: 60 g; Refill: 0   Start time: 10:54am End time: 10:59am  Total time spent on patient care (including telephone call/ virtual visit): 10 minutes  Cordaro Mukai Hulen Skains, DO [facility] [phone]

## 2019-03-28 ENCOUNTER — Telehealth: Payer: Self-pay | Admitting: *Deleted

## 2019-03-28 NOTE — Telephone Encounter (Addendum)
Prior Auth for Eucrisa 2%-APPROVED 03/28/2019 - 04/27/2019  PA# [id number]  Confirmation #:[id number]  [employer]  Pharmacy notified.

## 2019-04-03 DIAGNOSIS — E109 Type 1 diabetes mellitus without complications: Secondary | ICD-10-CM | POA: Diagnosis not present

## 2019-04-22 DIAGNOSIS — H5213 Myopia, bilateral: Secondary | ICD-10-CM | POA: Diagnosis not present

## 2019-04-25 ENCOUNTER — Other Ambulatory Visit: Payer: Self-pay

## 2019-04-26 ENCOUNTER — Encounter: Payer: Self-pay | Admitting: Family Medicine

## 2019-04-26 ENCOUNTER — Ambulatory Visit: Payer: Medicaid Other | Admitting: Family Medicine

## 2019-04-30 DIAGNOSIS — E1065 Type 1 diabetes mellitus with hyperglycemia: Secondary | ICD-10-CM | POA: Diagnosis not present

## 2019-04-30 DIAGNOSIS — Z9641 Presence of insulin pump (external) (internal): Secondary | ICD-10-CM | POA: Diagnosis not present

## 2019-04-30 DIAGNOSIS — E109 Type 1 diabetes mellitus without complications: Secondary | ICD-10-CM | POA: Diagnosis not present

## 2019-04-30 DIAGNOSIS — Z23 Encounter for immunization: Secondary | ICD-10-CM | POA: Diagnosis not present

## 2019-04-30 DIAGNOSIS — Z794 Long term (current) use of insulin: Secondary | ICD-10-CM | POA: Diagnosis not present

## 2019-05-04 ENCOUNTER — Ambulatory Visit (INDEPENDENT_AMBULATORY_CARE_PROVIDER_SITE_OTHER): Payer: Medicaid Other | Admitting: Nurse Practitioner

## 2019-05-04 ENCOUNTER — Encounter: Payer: Self-pay | Admitting: Nurse Practitioner

## 2019-05-04 ENCOUNTER — Other Ambulatory Visit: Payer: Self-pay

## 2019-05-04 DIAGNOSIS — K602 Anal fissure, unspecified: Secondary | ICD-10-CM

## 2019-05-04 DIAGNOSIS — E109 Type 1 diabetes mellitus without complications: Secondary | ICD-10-CM | POA: Diagnosis not present

## 2019-05-04 NOTE — Progress Notes (Signed)
   Virtual Visit via telephone Note Due to COVID-19 pandemic this visit was conducted virtually. This visit type was conducted due to national recommendations for restrictions regarding the COVID-19 Pandemic (e.g. social distancing, sheltering in place) in an effort to limit this patient's exposure and mitigate transmission in our community. All issues noted in this document were discussed and addressed.  A physical exam was not performed with this format.  I connected with Linda [name] on 05/04/19 at 3:30 by telephone and verified that I am speaking with the correct person using two identifiers. Linda [name] is currently located at home and no one is currently with her during visit. The provider, Mary-Margaret Hassell Done, FNP is located in their office at time of visit.  I discussed the limitations, risks, security and privacy concerns of performing an evaluation and management service by telephone and the availability of in person appointments. I also discussed with the patient that there may be a patient responsible charge related to this service. The patient expressed understanding and agreed to proceed.   History and Present Illness:   Chief Complaint: Diarrhea   HPI Patient calls in c/o of painful bowel movements with visible blood. She denies staining. She has a fissure in anal area.    Review of Systems  Constitutional: Negative for diaphoresis and weight loss.  Eyes: Negative for blurred vision, double vision and pain.  Respiratory: Negative for shortness of breath.   Cardiovascular: Negative for chest pain, palpitations, orthopnea and leg swelling.  Gastrointestinal: Negative for abdominal pain.  Skin: Negative for rash.  Neurological: Negative for dizziness, sensory change, loss of consciousness, weakness and headaches.  Endo/Heme/Allergies: Negative for polydipsia. Does not bruise/bleed easily.  Psychiatric/Behavioral: Negative for memory loss. The patient does not have  insomnia.   All other systems reviewed and are negative.    Observations/Objective: Alert and oriented- answers all questions appropriately No distress    Assessment and Plan: Cambrey [name] in today with chief complaint of Diarrhea   1. Anal fissure Stool softners Force fluids - Ambulatory referral to Gastroenterology    Follow Up Instructions: prn    I discussed the assessment and treatment plan with the patient. The patient was provided an opportunity to ask questions and all were answered. The patient agreed with the plan and demonstrated an understanding of the instructions.   The patient was advised to call back or seek an in-person evaluation if the symptoms worsen or if the condition fails to improve as anticipated.  The above assessment and management plan was discussed with the patient. The patient verbalized understanding of and has agreed to the management plan. Patient is aware to call the clinic if symptoms persist or worsen. Patient is aware when to return to the clinic for a follow-up visit. Patient educated on when it is appropriate to go to the emergency department.   Time call ended:  3:43  I provided 13 minutes of non-face-to-face time during this encounter.    Mary-Margaret Hassell Done, FNP

## 2019-05-16 DIAGNOSIS — E109 Type 1 diabetes mellitus without complications: Secondary | ICD-10-CM | POA: Diagnosis not present

## 2019-05-16 DIAGNOSIS — Z6379 Other stressful life events affecting family and household: Secondary | ICD-10-CM | POA: Diagnosis not present

## 2019-05-16 DIAGNOSIS — Z794 Long term (current) use of insulin: Secondary | ICD-10-CM | POA: Diagnosis not present

## 2019-05-16 DIAGNOSIS — F331 Major depressive disorder, recurrent, moderate: Secondary | ICD-10-CM | POA: Diagnosis not present

## 2019-05-20 ENCOUNTER — Other Ambulatory Visit: Payer: Self-pay

## 2019-05-20 ENCOUNTER — Encounter (HOSPITAL_COMMUNITY): Payer: Self-pay | Admitting: Emergency Medicine

## 2019-05-20 ENCOUNTER — Emergency Department (HOSPITAL_COMMUNITY)
Admission: EM | Admit: 2019-05-20 | Discharge: 2019-05-20 | Disposition: A | Discharge status: Home / Self Care | Payer: Medicaid Other | Attending: Emergency Medicine | Admitting: Emergency Medicine

## 2019-05-20 DIAGNOSIS — Z7722 Contact with and (suspected) exposure to environmental tobacco smoke (acute) (chronic): Secondary | ICD-10-CM | POA: Diagnosis not present

## 2019-05-20 DIAGNOSIS — Z79899 Other long term (current) drug therapy: Secondary | ICD-10-CM | POA: Diagnosis not present

## 2019-05-20 DIAGNOSIS — R739 Hyperglycemia, unspecified: Secondary | ICD-10-CM | POA: Diagnosis not present

## 2019-05-20 DIAGNOSIS — E860 Dehydration: Secondary | ICD-10-CM | POA: Diagnosis not present

## 2019-05-20 DIAGNOSIS — J45909 Unspecified asthma, uncomplicated: Secondary | ICD-10-CM | POA: Diagnosis not present

## 2019-05-20 DIAGNOSIS — R112 Nausea with vomiting, unspecified: Secondary | ICD-10-CM | POA: Diagnosis not present

## 2019-05-20 DIAGNOSIS — E1065 Type 1 diabetes mellitus with hyperglycemia: Secondary | ICD-10-CM | POA: Diagnosis not present

## 2019-05-20 DIAGNOSIS — R000 Tachycardia, unspecified: Secondary | ICD-10-CM | POA: Diagnosis not present

## 2019-05-20 DIAGNOSIS — Z049 Encounter for examination and observation for unspecified reason: Secondary | ICD-10-CM | POA: Diagnosis not present

## 2019-05-20 LAB — CBG MONITORING, ED
Glucose-Capillary: 440 mg/dL — ABNORMAL HIGH (ref 70–99)
Glucose-Capillary: 280 mg/dL — ABNORMAL HIGH (ref 70–99)

## 2019-05-20 LAB — CBC
HCT: 43.6 % (ref 36.0–46.0)
Hemoglobin: 14.3 g/dL (ref 12.0–15.0)
MCH: 31.4 pg (ref 26.0–34.0)
MCHC: 32.8 g/dL (ref 30.0–36.0)
MCV: 95.8 fL (ref 80.0–100.0)
Platelets: 243 10*3/uL (ref 150–400)
RBC: 4.55 MIL/uL (ref 3.87–5.11)
RDW: 11.4 % — ABNORMAL LOW (ref 11.5–15.5)
WBC: 17 10*3/uL — ABNORMAL HIGH (ref 4.0–10.5)
nRBC: 0 % (ref 0.0–0.2)

## 2019-05-20 LAB — BASIC METABOLIC PANEL
Anion gap: 12 (ref 5–15)
Anion gap: 7 (ref 5–15)
BUN: 16 mg/dL (ref 6–20)
BUN: 14 mg/dL (ref 6–20)
CO2: 17 mmol/L — ABNORMAL LOW (ref 22–32)
CO2: 15 mmol/L — ABNORMAL LOW (ref 22–32)
Calcium: 8.9 mg/dL (ref 8.9–10.3)
Calcium: 7.9 mg/dL — ABNORMAL LOW (ref 8.9–10.3)
Chloride: 105 mmol/L (ref 98–111)
Chloride: 113 mmol/L — ABNORMAL HIGH (ref 98–111)
Creatinine, Ser: 0.78 mg/dL (ref 0.44–1.00)
Creatinine, Ser: 0.74 mg/dL (ref 0.44–1.00)
GFR calc Af Amer: >60 mL/min (ref >60)
GFR calc Af Amer: >60 mL/min (ref >60)
GFR calc non Af Amer: >60 mL/min (ref >60)
GFR calc non Af Amer: >60 mL/min (ref >60)
Glucose, Bld: 418 mg/dL — ABNORMAL HIGH (ref 70–99)
Glucose, Bld: 233 mg/dL — ABNORMAL HIGH (ref 70–99)
Potassium: 6.4 mmol/L (ref 3.5–5.1)
Potassium: 4.3 mmol/L (ref 3.5–5.1)
Sodium: 134 mmol/L — ABNORMAL LOW (ref 135–145)
Sodium: 135 mmol/L (ref 135–145)

## 2019-05-20 LAB — URINALYSIS, ROUTINE W REFLEX MICROSCOPIC
Bilirubin Urine: NEGATIVE
Glucose, UA: >=500 mg/dL — AB
Hgb urine dipstick: NEGATIVE
Ketones, ur: 80 mg/dL — AB
Leukocytes,Ua: NEGATIVE
Nitrite: NEGATIVE
Protein, ur: NEGATIVE mg/dL
Specific Gravity, Urine: 1.027 (ref 1.005–1.030)
pH: 5 (ref 5.0–8.0)

## 2019-05-20 LAB — POC URINE PREG, ED: Preg Test, Ur: NEGATIVE

## 2019-05-20 MED ORDER — METOCLOPRAMIDE HCL 5 MG/ML IJ SOLN
10.0000 mg | Freq: Once | INTRAMUSCULAR | Status: AC
  Administered 2019-05-20: 10 mg via INTRAVENOUS
  Filled 2019-05-20: qty 2

## 2019-05-20 MED ORDER — SODIUM CHLORIDE 0.9 % IV BOLUS
2000.0000 mL | Freq: Once | INTRAVENOUS | Status: AC
  Administered 2019-05-20: 2000 mL via INTRAVENOUS

## 2019-05-20 MED ORDER — ONDANSETRON 4 MG PO TBDP: 4.0000 mg | ORAL_TABLET | Freq: Three times a day (TID) | ORAL | 0 refills | Status: DC | PRN

## 2019-05-20 MED ORDER — INSULIN ASPART 100 UNIT/ML ~~LOC~~ SOLN
12.0000 [IU] | Freq: Once | SUBCUTANEOUS | Status: AC
  Administered 2019-05-20: 12 [IU] via SUBCUTANEOUS
  Filled 2019-05-20: qty 1

## 2019-05-20 MED ORDER — SODIUM CHLORIDE 0.9 % IV BOLUS
1000.0000 mL | Freq: Once | INTRAVENOUS | Status: AC
  Administered 2019-05-20: 1000 mL via INTRAVENOUS

## 2019-05-20 MED ORDER — DIPHENHYDRAMINE HCL 50 MG/ML IJ SOLN
12.5000 mg | Freq: Once | INTRAMUSCULAR | Status: AC
  Administered 2019-05-20: 12.5 mg via INTRAVENOUS
  Filled 2019-05-20: qty 1

## 2019-05-20 MED ORDER — PROMETHAZINE HCL 25 MG/ML IJ SOLN
25.0000 mg | Freq: Once | INTRAMUSCULAR | Status: AC
  Administered 2019-05-20: 25 mg via INTRAVENOUS
  Filled 2019-05-20: qty 1

## 2019-05-20 NOTE — ED Triage Notes (Signed)
Patient brought in via EMS. Alert and oriented. Patient woke this morning withy nausea and vomiting. Patient is diabetic and checked ketones in urine, which read high. Per patient in DKA last November. Patient denies any fevers, cough, diarrhea, or nausea, and vomiting prior to this morning. Patient's BG per paramedic was 426. Patient given 53mL bolus of NS and 4mg  zofran in route. Patient still reports nausea.

## 2019-05-20 NOTE — ED Notes (Signed)
Pt given water 

## 2019-05-20 NOTE — ED Provider Notes (Signed)
[facility] EMERGENCY DEPARTMENT Provider Note   CSN: [id number] Arrival date & time: 05/20/19  7829     History   Chief Complaint Chief Complaint  Patient presents with  . Hyperglycemia    HPI Linda [name] is a 18 y.o. female.     The history is provided by the patient. No language interpreter was used.  Hyperglycemia Blood sugar level PTA:  440 Severity:  Moderate Onset quality:  Gradual Duration:  2 days Timing:  Constant Progression:  Worsening Chronicity:  New Diabetes status:  Controlled with insulin Context: recent illness   Relieved by:  Nothing Ineffective treatments:  None tried Associated symptoms: fatigue   Associated symptoms: no nausea   Risk factors: hx of DKA   Pt complains of elevated glucose.  Pt is insulin dependent.  Pt reports she does not take care of herself as she should. Pt has been admitted in the past for DKA   Past Medical History:  Diagnosis Date  . Allergy   . Asthma   . Carpal tunnel syndrome   . Diabetes mellitus without complication (HCC)    Type 1    Patient Active Problem List   Diagnosis Date Noted  . Irritable bowel syndrome with constipation 12/08/2018  . DKA (diabetic ketoacidoses) (HCC) 05/22/2018  . Depressive disorder 01/16/2018  . Panic attack 01/16/2018  . Acne vulgaris 10/29/2016  . 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 Asthma with acute exacerbation 09/21/2012    Past Surgical History:  Procedure Laterality Date  . ADENOIDECTOMY    . TONSILLECTOMY       OB History    Gravida  0   Para  0   Term  0   Preterm  0   AB  0   Living  0     SAB  0   TAB  0   Ectopic  0   Multiple  0   Live Births  0            Home Medications    Prior to Admission medications   Medication Sig Start Date End Date Taking? Authorizing Provider  busPIRone (BUSPAR) 15 MG tablet Take 1 tablet (15 mg total) by mouth 2 (two) times daily. 02/12/19  Yes Gottschalk, Kathie Rhodes M, DO   citalopram (CELEXA) 20 MG tablet Take 1 tablet (20 mg total) by mouth daily. 02/12/19  Yes Delynn Flavin M, DO  EPINEPHrine 0.3 mg/0.3 mL IJ SOAJ injection Inject 0.3 mLs (0.3 mg total) into the muscle as needed for anaphylaxis. 01/17/19  Yes Gottschalk, Ashly M, DO  glucagon (GLUCAGON EMERGENCY) 1 MG injection Inject 1 mg into the muscle once as needed (for blood sugar).    Yes [provider]  hydrOXYzine (ATARAX/VISTARIL) 25 MG tablet Take 0.5-1 tablets (12.5-25 mg total) by mouth every 8 (eight) hours as needed for anxiety or itching. 01/17/19  Yes Gottschalk, Kathie Rhodes M, DO  Insulin Human (INSULIN PUMP) SOLN Inject into the skin continuous. Patient uses an omnipod insulin pump with novolog insulin; patient unaware of current basal rate but may bolus up to 90 units additionally every day (30 units with meals three times a day = 90 additional units)   Yes [provider]  levonorgestrel-ethinyl estradiol (LILLOW) 0.15-30 MG-MCG tablet Take 1 tablet by mouth daily. 02/12/19  Yes Gottschalk, Kathie Rhodes M, DO  loratadine (CLARITIN) 10 MG tablet Take 1 tablet (10 mg total) by mouth daily. 02/13/19  Yes Gottschalk, Kathie Rhodes M, DO  omeprazole (  PRILOSEC) 20 MG capsule Take 1 capsule (20 mg total) by mouth daily. 08/13/18  Yes Little, Ambrose Finlandachel Morgan, MD  ondansetron (ZOFRAN ODT) 4 MG disintegrating tablet Take 1 tablet (4 mg total) by mouth every 8 (eight) hours as needed for nausea or vomiting. 11/13/18  Yes Gottschalk, Ashly M, DO  PAZEO 0.7 % SOLN Apply 1 drop to eye every morning. 04/11/19  Yes [provider]  polyethylene glycol powder (GLYCOLAX/MIRALAX) 17 GM/SCOOP powder Take 17 g by mouth 2 (two) times daily as needed. 12/08/18  Yes Rakes, Doralee AlbinoLinda M, FNP    Family History Family History  Problem Relation Age of Onset  . Hypertension Mother   . Asthma Sister   . Asthma Brother     Social History Social History   Tobacco Use  . Smoking status: Passive Smoke Exposure - Never Smoker   . Smokeless tobacco: Never Used  Substance Use Topics  . Alcohol use: No  . Drug use: No     Allergies   Patient has no known allergies.   Review of Systems Review of Systems  Constitutional: Positive for fatigue.  Gastrointestinal: Negative for nausea.  All other systems reviewed and are negative.    Physical Exam Updated Vital Signs BP 115/73   Pulse (!) 103   Temp 97.7 F (36.5 C) (Oral)   Resp 20   Ht 5\' 2"  (1.575 m)   Wt 88 kg   LMP 05/06/2019   SpO2 100%   BMI 35.48 kg/m   Physical Exam Vitals signs and nursing note reviewed.  Constitutional:      Appearance: She is well-developed.  HENT:     Head: Normocephalic.     Right Ear: Tympanic membrane normal.     Left Ear: Tympanic membrane normal.     Nose: Nose normal.     Mouth/Throat:     Mouth: Mucous membranes are moist.  Neck:     Musculoskeletal: Normal range of motion.  Cardiovascular:     Rate and Rhythm: Normal rate.  Pulmonary:     Effort: Pulmonary effort is normal.  Abdominal:     General: There is no distension.  Musculoskeletal: Normal range of motion.  Skin:    General: Skin is warm.  Neurological:     General: No focal deficit present.     Mental Status: She is alert and oriented to person, place, and time.  Psychiatric:        Mood and Affect: Mood normal.      ED Treatments / Results  Labs (all labs ordered are listed, but only abnormal results are displayed) Labs Reviewed  BASIC METABOLIC PANEL - Abnormal; Notable for the following components:      Result Value   Sodium 134 (*)    Potassium 6.4 (*)    CO2 17 (*)    Glucose, Bld 418 (*)    All other components within normal limits  CBC - Abnormal; Notable for the following components:   WBC 17.0 (*)    RDW 11.4 (*)    All other components within normal limits  URINALYSIS, ROUTINE W REFLEX MICROSCOPIC - Abnormal; Notable for the following components:   Color, Urine STRAW (*)    Glucose, UA >=500 (*)    Ketones, ur  80 (*)    Bacteria, UA RARE (*)    All other components within normal limits  BASIC METABOLIC PANEL - Abnormal; Notable for the following components:   Chloride 113 (*)    CO2 15 (*)  Glucose, Bld 233 (*)    Calcium 7.9 (*)    All other components within normal limits  CBG MONITORING, ED - Abnormal; Notable for the following components:   Glucose-Capillary 440 (*)    All other components within normal limits  CBG MONITORING, ED - Abnormal; Notable for the following components:   Glucose-Capillary 280 (*)    All other components within normal limits  POC URINE PREG, ED  CBG MONITORING, ED    EKG EKG Interpretation  Date/Time:  "Sunday May 20 2019 07:46:42 EST Ventricular Rate:  116 PR Interval:    QRS Duration: 80 QT Interval:  322 QTC Calculation: 448 R Axis:   81 Text Interpretation: Sinus tachycardia Confirmed by Cook, Brian ([id number]) on 05/20/2019 9:29:21 AM   Radiology No results found.  Procedures Procedures (including critical care time)  Medications Ordered in ED Medications  promethazine (PHENERGAN) injection 25 mg (25 mg Intravenous Given 05/20/19 0801)  sodium chloride 0.9 % bolus 2,000 mL (0 mLs Intravenous Stopped 05/20/19 1012)  metoCLOPramide (REGLAN) injection 10 mg (10 mg Intravenous Given 05/20/19 0913)  diphenhydrAMINE (BENADRYL) injection 12.5 mg (12.5 mg Intravenous Given 05/20/19 0912)  sodium chloride 0.9 % bolus 1,000 mL (0 mLs Intravenous Stopped 05/20/19 1346)  insulin aspart (novoLOG) injection 12 Units (12 Units Subcutaneous Given 05/20/19 1026)     Initial Impression / Assessment and Plan / ED Course  I have reviewed the triage vital signs and the nursing notes.  Pertinent labs & imaging results that were available during my care of the patient were reviewed by me and considered in my medical decision making (see chart for details).        MDM  IV NS x 2 liters,  Pt given 12 units of insulin subq.  Pt's initial K 6.9 and  glucose 418.  Recheck glucose 233. Potasium normalized.   Pt counseled on taking her medication.  Self care.  Pt counseled on complications of diabetes   Final Clinical Impressions(s) / ED Diagnoses   Final diagnoses:  Hyperglycemia  Dehydration    ED Discharge Orders         Ordered    ondansetron (ZOFRAN ODT) 4 MG disintegrating tablet  Every 8 hours PRN     11" /15/20 1414        An After Visit Summary was printed and given to the patient.    Fransico Meadow, PA-C 05/20/19 1414    Nat Christen, MD 05/23/19 [phone]

## 2019-05-20 NOTE — ED Notes (Signed)
Date and time results received: 05/20/19 0951 (use smartphrase ".now" to insert current time)  Test: K+ Critical Value: 6.4  Name of Provider Notified: sofia  Orders Received? Or Actions Taken?: see chart

## 2019-05-30 DIAGNOSIS — E109 Type 1 diabetes mellitus without complications: Secondary | ICD-10-CM | POA: Diagnosis not present

## 2019-05-30 DIAGNOSIS — F331 Major depressive disorder, recurrent, moderate: Secondary | ICD-10-CM | POA: Diagnosis not present

## 2019-06-04 ENCOUNTER — Telehealth: Payer: Self-pay | Admitting: Family Medicine

## 2019-06-04 NOTE — Telephone Encounter (Signed)
Tele visit scheduled tomorrow at 10:25 am with Dr. Livia Snellen.  Patient aware.

## 2019-06-05 ENCOUNTER — Ambulatory Visit (INDEPENDENT_AMBULATORY_CARE_PROVIDER_SITE_OTHER): Payer: Medicaid Other | Admitting: Family Medicine

## 2019-06-05 ENCOUNTER — Encounter: Payer: Self-pay | Admitting: Family Medicine

## 2019-06-05 DIAGNOSIS — J029 Acute pharyngitis, unspecified: Secondary | ICD-10-CM | POA: Diagnosis not present

## 2019-06-05 MED ORDER — AMOXICILLIN 875 MG PO TABS: 875.0000 mg | ORAL_TABLET | Freq: Two times a day (BID) | ORAL | 0 refills | Status: AC

## 2019-06-05 NOTE — Progress Notes (Signed)
Subjective:    Patient ID: Linda [name], female    DOB: January 12, 2001, 18 y.o.   MRN: [id number]   HPI: Linda [name] is a 18 y.o. female presenting for televisit. HEr mom has their only phone with her and she is away from home. Linda [name] is at home. History given by Mom. Onset 2 days ago of sore throat and body aches. Denies fever. Cough is mild. No dyspnea. Hurts to swallow. Some HA asa well.    Depression screen [facility] 2/9 01/17/2019 01/02/2019 12/19/2018 12/08/2018 12/06/2018  Decreased Interest 1 1 2 3 3   Down, Depressed, Hopeless 1 1 2 3 3   PHQ - 2 Score 2 2 4 6 6   Altered sleeping 2 2 2 3 1   Tired, decreased energy 0 1 1 3 3   Change in appetite 0 1 1 3 3   Feeling bad or failure about yourself  1 0 1 3 3   Trouble concentrating 0 0 1 3 3   Moving slowly or fidgety/restless 0 1 0 0 0  Suicidal thoughts 0 0 0 0 0  PHQ-9 Score 5 7 10 21 19   Difficult doing work/chores - Somewhat difficult Somewhat difficult Very difficult Extremely dIfficult  Some recent data might be hidden     Relevant past medical, surgical, family and social history reviewed and updated as indicated.  Interim medical history since our last visit reviewed. Allergies and medications reviewed and updated.  ROS:  Review of Systems  Constitutional: Positive for fatigue. Negative for activity change and fever.  HENT: Positive for congestion, sore throat and trouble swallowing.   Respiratory: Negative for chest tightness and shortness of breath.   Gastrointestinal: Negative.      Social History   Tobacco Use  Smoking Status Passive Smoke Exposure - Never Smoker  Smokeless Tobacco Never Used       Objective:     Wt Readings from Last 3 Encounters:  05/20/19 194 lb (88 kg) (97 %, Z= 1.87)*  12/08/18 193 lb (87.5 kg) (97 %, Z= 1.87)*  12/06/18 194 lb (88 kg) (97 %, Z= 1.88)*   * Growth percentiles are based on CDC (Girls, 2-20 Years) data.     Exam deferred. Pt. Harboring due to COVID 19. Phone visit  performed.   Assessment & Plan:   1. Acute pharyngitis, unspecified etiology     Meds ordered this encounter  Medications  . amoxicillin (AMOXIL) 875 MG tablet    Sig: Take 1 tablet (875 mg total) by mouth 2 (two) times daily for 10 days.    Dispense:  20 tablet    Refill:  0    Orders Placed This Encounter  Procedures  . COVID    Order Specific Question:   Is this test for diagnosis or screening    Answer:   Diagnosis of ill patient    Order Specific Question:   Symptomatic for COVID-19 as defined by CDC    Answer:   Yes    Order Specific Question:   Date of Symptom Onset    Answer:   06/03/2019    Order Specific Question:   Hospitalized for COVID-19    Answer:   No    Order Specific Question:   Admitted to ICU for COVID-19    Answer:   No    Order Specific Question:   Previously tested for COVID-19    Answer:   No    Order Specific Question:   Resident in a congregate (group) care setting  Answer:   No    Order Specific Question:   Is the patient student?    Answer:   No    Order Specific Question:   Employed in healthcare setting    Answer:   No    Order Specific Question:   Pregnant    Answer:   No      Diagnoses and all orders for this visit:  Acute pharyngitis, unspecified etiology -     COVID  Other orders -     amoxicillin (AMOXIL) 875 MG tablet; Take 1 tablet (875 mg total) by mouth 2 (two) times daily for 10 days.    Virtual Visit via telephone Note  I discussed the limitations, risks, security and privacy concerns of performing an evaluation and management service by telephone and the availability of in person appointments. The patient was identified with two identifiers.Mother asks to give history for her.  Pt.expressed understanding and agreed to proceed. Pt. Is at home. Dr. Darlyn Read is in his office.  Follow Up Instructions:   I discussed the assessment and treatment plan with the patient surrogate, who was provided an opportunity to ask  questions and all were answered. She agreed with the plan and demonstrated an understanding of the instructions.   The patient was advised to call back or seek an in-person evaluation if the symptoms worsen or if the condition fails to improve as anticipated.   Total minutes including chart review and phone contact time: 8   Follow up plan: No follow-ups on file.  Mechele Claude, MD [facility]

## 2019-06-11 ENCOUNTER — Encounter: Payer: Self-pay | Admitting: Nurse Practitioner

## 2019-06-12 ENCOUNTER — Ambulatory Visit (INDEPENDENT_AMBULATORY_CARE_PROVIDER_SITE_OTHER): Payer: Medicaid Other | Admitting: Family

## 2019-06-12 ENCOUNTER — Encounter: Payer: Self-pay | Admitting: Family

## 2019-06-12 DIAGNOSIS — B373 Candidiasis of vulva and vagina: Secondary | ICD-10-CM | POA: Diagnosis not present

## 2019-06-12 DIAGNOSIS — B3731 Acute candidiasis of vulva and vagina: Secondary | ICD-10-CM

## 2019-06-12 MED ORDER — FLUCONAZOLE 150 MG PO TABS: 150.0000 mg | ORAL_TABLET | ORAL | 0 refills | Status: DC | PRN

## 2019-06-12 NOTE — Progress Notes (Signed)
   Virtual Visit via telephone Note Due to COVID-19 pandemic this visit was conducted virtually. This visit type was conducted due to national recommendations for restrictions regarding the COVID-19 Pandemic (e.g. social distancing, sheltering in place) in an effort to limit this patient's exposure and mitigate transmission in our community. All issues noted in this document were discussed and addressed.  A physical exam was not performed with this format.  I connected with Linda [name] on 06/12/19 at 2:20 pm by telephone and verified that I am speaking with the correct person using two identifiers. Linda [name] is currently located at home and no one is currently with her during visit. The provider, Evelina Dun, FNP is located in their office at time of visit.  I discussed the limitations, risks, security and privacy concerns of performing an evaluation and management service by telephone and the availability of in person appointments. I also discussed with the patient that there may be a patient responsible charge related to this service. The patient expressed understanding and agreed to proceed.   History and Present Illness:  Vaginal Itching The patient's primary symptoms include genital itching, a genital odor and vaginal discharge. This is a new problem. The current episode started yesterday. The problem occurs intermittently. The problem has been gradually worsening. The patient is experiencing no pain. Pertinent negatives include no discolored urine, flank pain, frequency, hematuria, joint pain or vomiting. The vaginal discharge was milky and thick. There has been no bleeding. She has tried nothing for the symptoms. The treatment provided no relief. She is sexually active. No, her partner does not have an STD.      Review of Systems  Gastrointestinal: Negative for vomiting.  Genitourinary: Positive for vaginal discharge. Negative for flank pain, frequency and hematuria.   Musculoskeletal: Negative for joint pain.  All other systems reviewed and are negative.    Observations/Objective: No SOB or distress noted  Assessment and Plan: 1. Vagina, candidiasis Do not scratch Yogurt daily Cotton underwear Avoid douching RTO if symptoms worsen or do not improve   - fluconazole (DIFLUCAN) 150 MG tablet; Take 1 tablet (150 mg total) by mouth every three (3) days as needed.  Dispense: 2 tablet; Refill: 0     I discussed the assessment and treatment plan with the patient. The patient was provided an opportunity to ask questions and all were answered. The patient agreed with the plan and demonstrated an understanding of the instructions.   The patient was advised to call back or seek an in-person evaluation if the symptoms worsen or if the condition fails to improve as anticipated.  The above assessment and management plan was discussed with the patient. The patient verbalized understanding of and has agreed to the management plan. Patient is aware to call the clinic if symptoms persist or worsen. Patient is aware when to return to the clinic for a follow-up visit. Patient educated on when it is appropriate to go to the emergency department.   Time call ended:  2:28 pm  I provided 8 minutes of non-face-to-face time during this encounter.    Evelina Dun, FNP

## 2019-06-21 ENCOUNTER — Other Ambulatory Visit: Payer: Self-pay

## 2019-06-21 ENCOUNTER — Ambulatory Visit: Payer: Medicaid Other | Attending: Internal Medicine

## 2019-06-21 DIAGNOSIS — Z20822 Contact with and (suspected) exposure to covid-19: Secondary | ICD-10-CM

## 2019-06-21 DIAGNOSIS — Z20828 Contact with and (suspected) exposure to other viral communicable diseases: Secondary | ICD-10-CM | POA: Diagnosis not present

## 2019-06-22 LAB — NOVEL CORONAVIRUS, NAA: SARS-CoV-2, NAA: DETECTED — AB

## 2019-06-27 ENCOUNTER — Ambulatory Visit (INDEPENDENT_AMBULATORY_CARE_PROVIDER_SITE_OTHER): Payer: Medicaid Other | Admitting: Family Medicine

## 2019-06-27 ENCOUNTER — Encounter: Payer: Self-pay | Admitting: Family Medicine

## 2019-06-27 DIAGNOSIS — U071 COVID-19: Secondary | ICD-10-CM

## 2019-06-27 MED ORDER — ALBUTEROL SULFATE HFA 108 (90 BASE) MCG/ACT IN AERS: 2.0000 | INHALATION_SPRAY | Freq: Four times a day (QID) | RESPIRATORY_TRACT | 0 refills | Status: DC | PRN

## 2019-06-27 NOTE — Patient Instructions (Signed)
This information is directly available on the CDC website: https://www.cdc.gov/coronavirus/2019-ncov/if-you-are-sick/steps-when-sick.html    Source:CDC Reference to specific commercial products, manufacturers, companies, or trademarks does not constitute its endorsement or recommendation by the U.S. Government, Department of Health and Human Services, or Centers for Disease Control and Prevention.  

## 2019-06-27 NOTE — Progress Notes (Signed)
Virtual Visit via Telephone Note  I connected with Tsosie Billing Sorrel on 06/27/19 at 2:30 PM by telephone and verified that I am speaking with the correct person using two identifiers. [place] Deborah Chalk Sorrel is currently located at home and nobody is currently with her during this visit. The provider, Gwenlyn Fudge, FNP is located in their office at time of visit.  I discussed the limitations, risks, security and privacy concerns of performing an evaluation and management service by telephone and the availability of in person appointments. I also discussed with the patient that there may be a patient responsible charge related to this service. The patient expressed understanding and agreed to proceed.  Subjective: PCP: Raliegh Ip, DO  Chief Complaint  Patient presents with  . URI   COVID-19 Getting worse Can't smell Tired Exhausted Flu feeling A little short of breath    ROS: Per HPI  Current Outpatient Medications:  .  busPIRone (BUSPAR) 15 MG tablet, Take 1 tablet (15 mg total) by mouth 2 (two) times daily., Disp: 60 tablet, Rfl: 3 .  citalopram (CELEXA) 20 MG tablet, Take 1 tablet (20 mg total) by mouth daily., Disp: 30 tablet, Rfl: 12 .  EPINEPHrine 0.3 mg/0.3 mL IJ SOAJ injection, Inject 0.3 mLs (0.3 mg total) into the muscle as needed for anaphylaxis., Disp: 1 each, Rfl: 0 .  fluconazole (DIFLUCAN) 150 MG tablet, Take 1 tablet (150 mg total) by mouth every three (3) days as needed., Disp: 2 tablet, Rfl: 0 .  glucagon (GLUCAGON EMERGENCY) 1 MG injection, Inject 1 mg into the muscle once as needed (for blood sugar). , Disp: , Rfl:  .  hydrOXYzine (ATARAX/VISTARIL) 25 MG tablet, Take 0.5-1 tablets (12.5-25 mg total) by mouth every 8 (eight) hours as needed for anxiety or itching., Disp: 30 tablet, Rfl: 1 .  Insulin Human (INSULIN PUMP) SOLN, Inject into the skin continuous. Patient uses an omnipod insulin pump with novolog insulin; patient unaware of current basal  rate but may bolus up to 90 units additionally every day (30 units with meals three times a day = 90 additional units), Disp: , Rfl:  .  levonorgestrel-ethinyl estradiol (LILLOW) 0.15-30 MG-MCG tablet, Take 1 tablet by mouth daily., Disp: 84 tablet, Rfl: 3 .  loratadine (CLARITIN) 10 MG tablet, Take 1 tablet (10 mg total) by mouth daily., Disp: 30 tablet, Rfl: 11 .  omeprazole (PRILOSEC) 20 MG capsule, Take 1 capsule (20 mg total) by mouth daily., Disp: 7 capsule, Rfl: 0 .  ondansetron (ZOFRAN ODT) 4 MG disintegrating tablet, Take 1 tablet (4 mg total) by mouth every 8 (eight) hours as needed for nausea or vomiting., Disp: 20 tablet, Rfl: 0 .  PAZEO 0.7 % SOLN, Apply 1 drop to eye every morning., Disp: , Rfl:  .  polyethylene glycol powder (GLYCOLAX/MIRALAX) 17 GM/SCOOP powder, Take 17 g by mouth 2 (two) times daily as needed., Disp: 3350 g, Rfl: 3  No Known Allergies Past Medical History:  Diagnosis Date  . Allergy   . Asthma   . Carpal tunnel syndrome   . Diabetes mellitus without complication (HCC)    Type 1    Observations/Objective: A&O  No respiratory distress or wheezing audible over the phone Mood, judgement, and thought processes all WNL  Assessment and Plan: 1. COVID-19 - Discussed symptom management and if there is nothing I can give her to treat COVID-19.  Advised that if she becomes short of breath and is unable to brace she needs to  go to the emergency room.  Encouraged use of vitamin C and zinc to help boost immune system.  I did go ahead and prescribe her an albuterol inhaler as she has a history of asthma and does not have one she reports her shortness of breath is very mild and is only happened a few times so far but given that she is worsening and the holidays are coming up I advised her to go ahead and pick up the prescription.  Education provided on COVID-19. - albuterol (VENTOLIN HFA) 108 (90 Base) MCG/ACT inhaler; Inhale 2 puffs into the lungs every 6 (six) hours as  needed for wheezing or shortness of breath.  Dispense: 18 g; Refill: 0   Follow Up Instructions:  I discussed the assessment and treatment plan with the patient. The patient was provided an opportunity to ask questions and all were answered. The patient agreed with the plan and demonstrated an understanding of the instructions.   The patient was advised to call back or seek an in-person evaluation if the symptoms worsen or if the condition fails to improve as anticipated.  The above assessment and management plan was discussed with the patient. The patient verbalized understanding of and has agreed to the management plan. Patient is aware to call the clinic if symptoms persist or worsen. Patient is aware when to return to the clinic for a follow-up visit. Patient educated on when it is appropriate to go to the emergency department.   Time call ended: 2:35   I provided 11 minutes of non-face-to-face time during this encounter.  Hendricks Limes, MSN, APRN, FNP-C [facility] 06/27/19

## 2019-07-28 ENCOUNTER — Other Ambulatory Visit: Payer: Self-pay | Admitting: Family Medicine

## 2019-07-28 DIAGNOSIS — F410 Panic disorder [episodic paroxysmal anxiety] without agoraphobia: Secondary | ICD-10-CM

## 2019-07-28 DIAGNOSIS — F329 Major depressive disorder, single episode, unspecified: Secondary | ICD-10-CM

## 2019-07-28 DIAGNOSIS — F32A Depression, unspecified: Secondary | ICD-10-CM

## 2019-08-13 DIAGNOSIS — E109 Type 1 diabetes mellitus without complications: Secondary | ICD-10-CM | POA: Diagnosis not present

## 2019-08-13 DIAGNOSIS — Z794 Long term (current) use of insulin: Secondary | ICD-10-CM | POA: Diagnosis not present

## 2019-08-29 DIAGNOSIS — E109 Type 1 diabetes mellitus without complications: Secondary | ICD-10-CM | POA: Diagnosis not present

## 2019-08-29 DIAGNOSIS — F330 Major depressive disorder, recurrent, mild: Secondary | ICD-10-CM | POA: Diagnosis not present

## 2019-08-29 DIAGNOSIS — F331 Major depressive disorder, recurrent, moderate: Secondary | ICD-10-CM | POA: Diagnosis not present

## 2019-08-29 DIAGNOSIS — F419 Anxiety disorder, unspecified: Secondary | ICD-10-CM | POA: Diagnosis not present

## 2019-08-31 ENCOUNTER — Encounter: Payer: Self-pay | Admitting: Family

## 2019-08-31 ENCOUNTER — Telehealth (INDEPENDENT_AMBULATORY_CARE_PROVIDER_SITE_OTHER): Payer: Medicaid Other | Admitting: Family

## 2019-08-31 NOTE — Progress Notes (Signed)
Attempted to call patient at given number. Her grandmother answered and states she is not there. She gave me her daughters number to call. Attempted to call this number and patient is not there either. Will close out chart.

## 2019-09-07 ENCOUNTER — Other Ambulatory Visit: Payer: Self-pay | Admitting: Family Medicine

## 2019-09-07 DIAGNOSIS — F329 Major depressive disorder, single episode, unspecified: Secondary | ICD-10-CM

## 2019-09-07 DIAGNOSIS — F410 Panic disorder [episodic paroxysmal anxiety] without agoraphobia: Secondary | ICD-10-CM

## 2019-09-07 DIAGNOSIS — F32A Depression, unspecified: Secondary | ICD-10-CM

## 2019-09-10 ENCOUNTER — Telehealth: Payer: Medicaid Other | Admitting: Family

## 2019-09-25 ENCOUNTER — Other Ambulatory Visit: Payer: Self-pay | Admitting: Family Medicine

## 2019-09-25 DIAGNOSIS — L2489 Irritant contact dermatitis due to other agents: Secondary | ICD-10-CM

## 2019-09-28 ENCOUNTER — Telehealth: Payer: Self-pay | Admitting: Family Medicine

## 2019-09-28 NOTE — Telephone Encounter (Signed)
Patient is going to call the pharmacy and let them know that she didn't ask for the oitment

## 2019-11-12 DIAGNOSIS — E109 Type 1 diabetes mellitus without complications: Secondary | ICD-10-CM | POA: Diagnosis not present

## 2019-11-12 DIAGNOSIS — E10649 Type 1 diabetes mellitus with hypoglycemia without coma: Secondary | ICD-10-CM | POA: Diagnosis not present

## 2019-11-12 DIAGNOSIS — Z9641 Presence of insulin pump (external) (internal): Secondary | ICD-10-CM | POA: Diagnosis not present

## 2019-11-25 IMAGING — DX DG CHEST 2V
2 series · 2 of 2 positions shown · non-contrast
Comparison: None.

CLINICAL DATA: Chest pain for 1 day

EXAM:
CHEST - 2 VIEW

[chest pa]
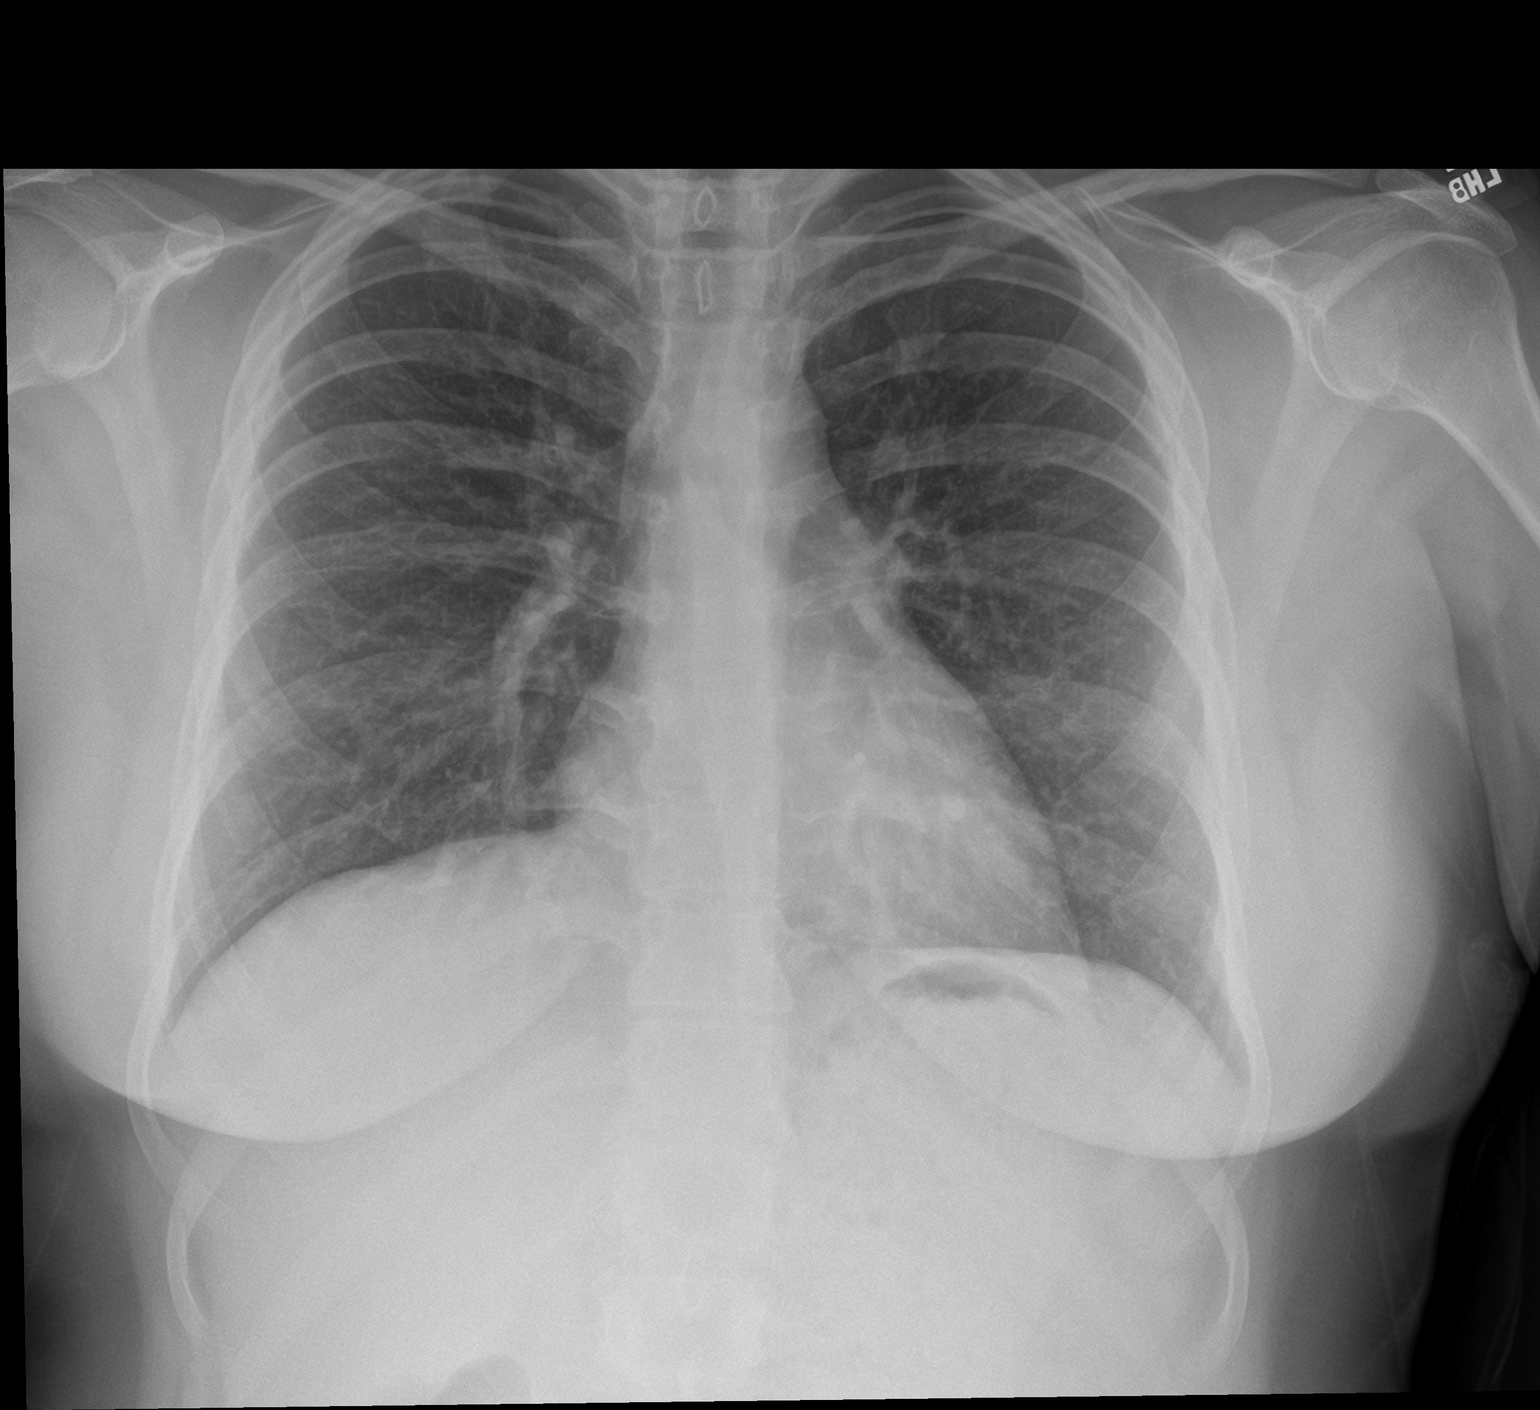

[chest lat]
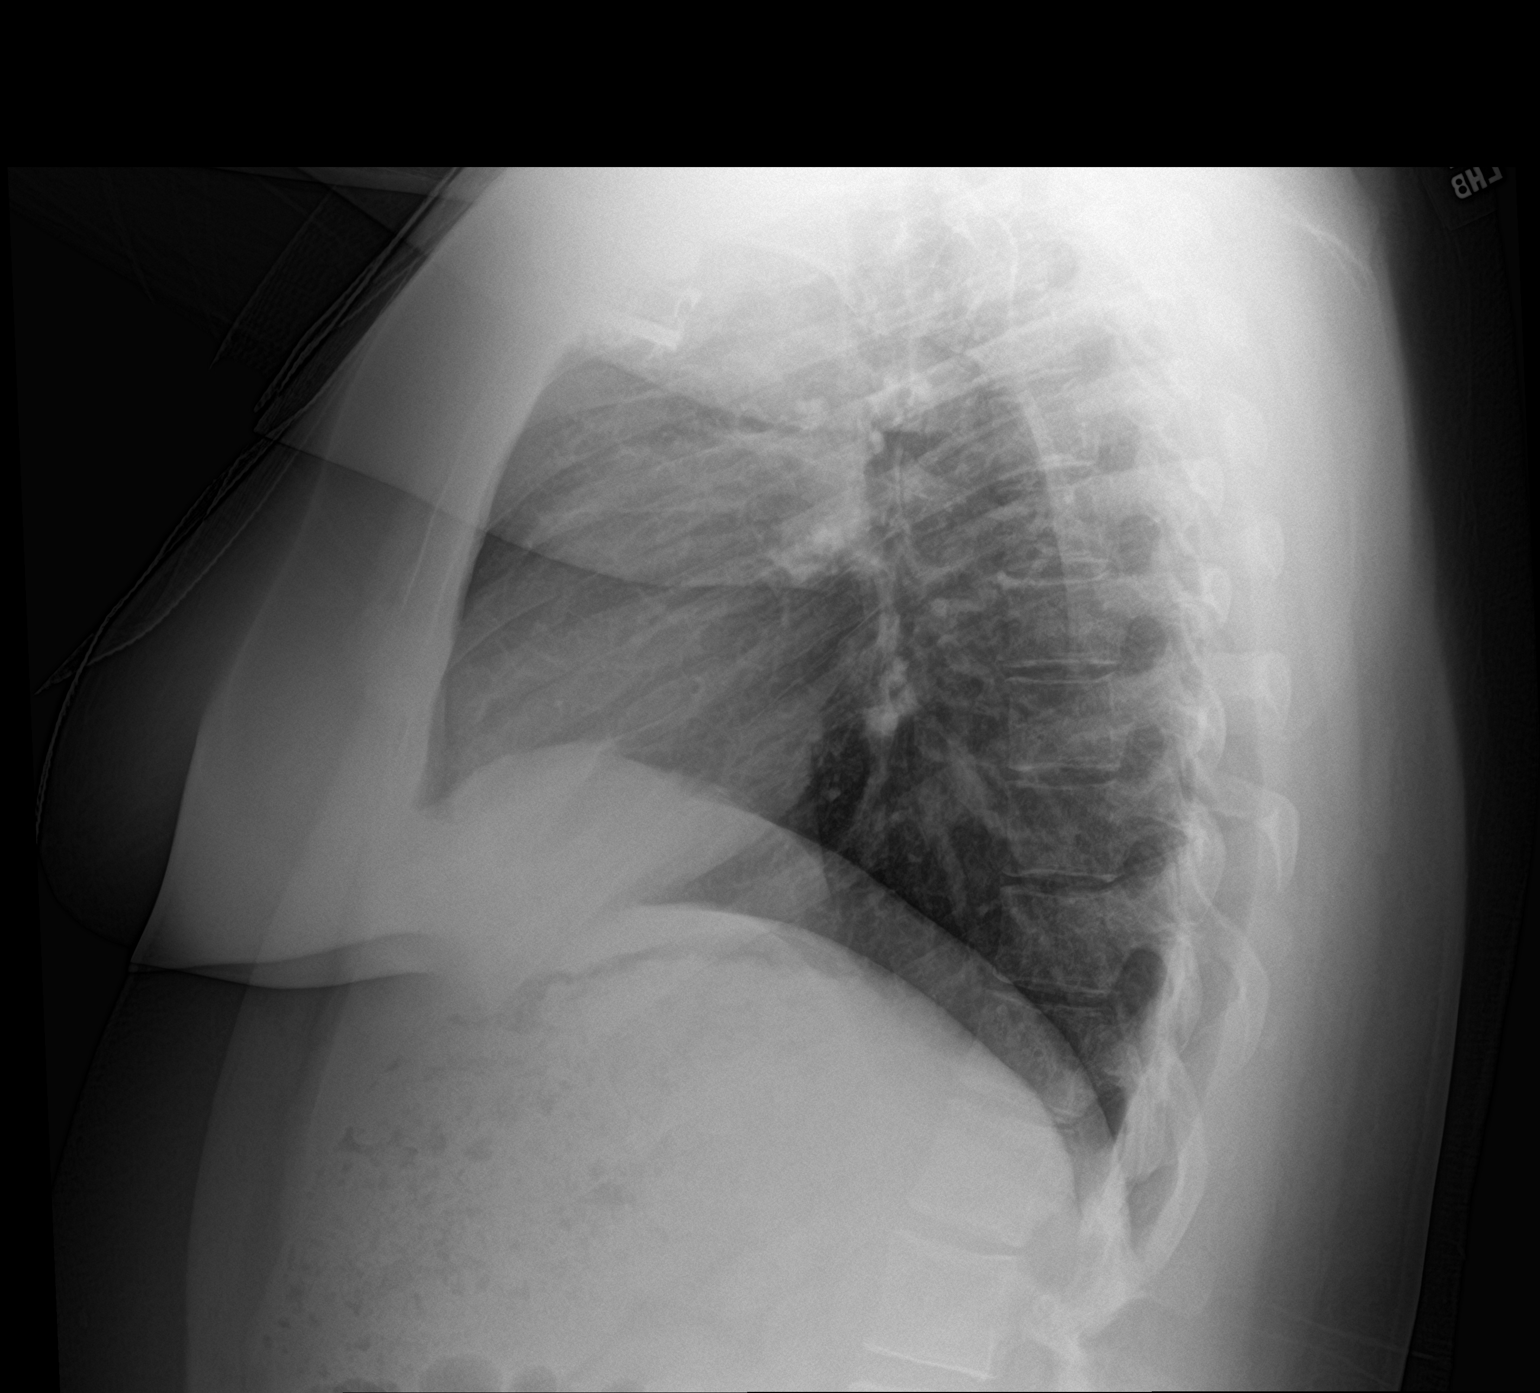

[2 of 2 positions shown; findings below may reference images not displayed]

FINDINGS: Normal heart size and mediastinal contours. Borderline airway
thickening. No acute infiltrate or edema. No effusion or
pneumothorax. No acute osseous findings.
IMPRESSION: No acute finding.

## 2019-12-05 ENCOUNTER — Other Ambulatory Visit: Payer: Self-pay

## 2019-12-05 ENCOUNTER — Emergency Department (HOSPITAL_COMMUNITY)
Admission: EM | Admit: 2019-12-05 | Discharge: 2019-12-06 | Disposition: A | Discharge status: Home / Self Care | Payer: Medicaid Other | Source: Home / Self Care | Attending: Emergency Medicine | Admitting: Emergency Medicine

## 2019-12-05 ENCOUNTER — Encounter (HOSPITAL_COMMUNITY): Payer: Self-pay | Admitting: Emergency Medicine

## 2019-12-05 ENCOUNTER — Emergency Department (HOSPITAL_COMMUNITY)
Admission: EM | Admit: 2019-12-05 | Discharge: 2019-12-05 | Disposition: A | Discharge status: Home / Self Care | Payer: Medicaid Other | Attending: Emergency Medicine | Admitting: Emergency Medicine

## 2019-12-05 ENCOUNTER — Encounter (HOSPITAL_COMMUNITY): Payer: Self-pay | Admitting: *Deleted

## 2019-12-05 DIAGNOSIS — Z794 Long term (current) use of insulin: Secondary | ICD-10-CM | POA: Diagnosis not present

## 2019-12-05 DIAGNOSIS — J45909 Unspecified asthma, uncomplicated: Secondary | ICD-10-CM | POA: Insufficient documentation

## 2019-12-05 DIAGNOSIS — R739 Hyperglycemia, unspecified: Secondary | ICD-10-CM

## 2019-12-05 DIAGNOSIS — E1165 Type 2 diabetes mellitus with hyperglycemia: Secondary | ICD-10-CM | POA: Diagnosis not present

## 2019-12-05 DIAGNOSIS — R1110 Vomiting, unspecified: Secondary | ICD-10-CM | POA: Diagnosis not present

## 2019-12-05 DIAGNOSIS — E860 Dehydration: Secondary | ICD-10-CM

## 2019-12-05 DIAGNOSIS — E109 Type 1 diabetes mellitus without complications: Secondary | ICD-10-CM | POA: Insufficient documentation

## 2019-12-05 DIAGNOSIS — Z79899 Other long term (current) drug therapy: Secondary | ICD-10-CM | POA: Insufficient documentation

## 2019-12-05 DIAGNOSIS — R9431 Abnormal electrocardiogram [ECG] [EKG]: Secondary | ICD-10-CM | POA: Diagnosis not present

## 2019-12-05 HISTORY — DX: Anxiety disorder, unspecified: F41.9

## 2019-12-05 HISTORY — DX: Depression, unspecified: F32.A

## 2019-12-05 LAB — CBG MONITORING, ED
Glucose-Capillary: 283 mg/dL — ABNORMAL HIGH (ref 70–99)
Glucose-Capillary: 254 mg/dL — ABNORMAL HIGH (ref 70–99)

## 2019-12-05 NOTE — ED Triage Notes (Signed)
Pt reports ketones of 160 on her home test. Pt reports N/V. Pts reports BS of 269 at 2130 tonight.

## 2019-12-05 NOTE — ED Triage Notes (Signed)
Pt c/o vomiting that started this morning. Pt reports her blood was 435 this morning when she woke up. Pt reports her blood sugar has come down to 230 but her urine is still positive for ketones. Pt reports she feels fatigued. Pt reports she has a binge eating disorder and last night she was eating a lot of food which is why her sugar was probably elevated this morning. Pt reports she called her doctor and they told her to come to ED since she was still having ketones in her urine and to check if she was in DKA. Pt also c/o dizziness and headache.

## 2019-12-05 NOTE — ED Notes (Signed)
Went out to waiting room to call pt for room. Pt not found in waiting room or parking lot when screener searched for pt.

## 2019-12-06 DIAGNOSIS — Z3202 Encounter for pregnancy test, result negative: Secondary | ICD-10-CM | POA: Diagnosis not present

## 2019-12-06 DIAGNOSIS — E1065 Type 1 diabetes mellitus with hyperglycemia: Secondary | ICD-10-CM | POA: Diagnosis not present

## 2019-12-06 LAB — CBC
HCT: 45.7 % (ref 36.0–46.0)
Hemoglobin: 15 g/dL (ref 12.0–15.0)
MCH: 30.7 pg (ref 26.0–34.0)
MCHC: 32.8 g/dL (ref 30.0–36.0)
MCV: 93.5 fL (ref 80.0–100.0)
Platelets: 264 10*3/uL (ref 150–400)
RBC: 4.89 MIL/uL (ref 3.87–5.11)
RDW: 11.3 % — ABNORMAL LOW (ref 11.5–15.5)
WBC: 8.1 10*3/uL (ref 4.0–10.5)
nRBC: 0 % (ref 0.0–0.2)

## 2019-12-06 LAB — URINALYSIS, ROUTINE W REFLEX MICROSCOPIC
Bacteria, UA: NONE SEEN
Bilirubin Urine: NEGATIVE
Glucose, UA: >=500 mg/dL — AB
Hgb urine dipstick: NEGATIVE
Ketones, ur: 80 mg/dL — AB
Leukocytes,Ua: NEGATIVE
Nitrite: NEGATIVE
Protein, ur: NEGATIVE mg/dL
Specific Gravity, Urine: 1.033 — ABNORMAL HIGH (ref 1.005–1.030)
pH: 5 (ref 5.0–8.0)

## 2019-12-06 LAB — CBG MONITORING, ED: Glucose-Capillary: 240 mg/dL — ABNORMAL HIGH (ref 70–99)

## 2019-12-06 LAB — PREGNANCY, URINE: Preg Test, Ur: NEGATIVE

## 2019-12-06 LAB — COMPREHENSIVE METABOLIC PANEL
ALT: 12 U/L (ref 0–44)
AST: 12 U/L — ABNORMAL LOW (ref 15–41)
Albumin: 3.9 g/dL (ref 3.5–5.0)
Alkaline Phosphatase: 78 U/L (ref 38–126)
Anion gap: 11 (ref 5–15)
BUN: 13 mg/dL (ref 6–20)
CO2: 25 mmol/L (ref 22–32)
Calcium: 9.2 mg/dL (ref 8.9–10.3)
Chloride: 99 mmol/L (ref 98–111)
Creatinine, Ser: 0.7 mg/dL (ref 0.44–1.00)
GFR calc Af Amer: >60 mL/min (ref >60)
GFR calc non Af Amer: >60 mL/min (ref >60)
Glucose, Bld: 283 mg/dL — ABNORMAL HIGH (ref 70–99)
Potassium: 3.8 mmol/L (ref 3.5–5.1)
Sodium: 135 mmol/L (ref 135–145)
Total Bilirubin: 0.7 mg/dL (ref 0.3–1.2)
Total Protein: 7.4 g/dL (ref 6.5–8.1)

## 2019-12-06 MED ORDER — LACTATED RINGERS IV BOLUS
1000.0000 mL | Freq: Once | INTRAVENOUS | Status: AC
  Administered 2019-12-06: 1000 mL via INTRAVENOUS

## 2019-12-06 MED ORDER — SODIUM CHLORIDE 0.9 % IV BOLUS
1000.0000 mL | Freq: Once | INTRAVENOUS | Status: AC
  Administered 2019-12-06: 1000 mL via INTRAVENOUS

## 2019-12-06 NOTE — ED Notes (Addendum)
Pt getting blood drawn by lab in waiting room, pt told lab she was going to pass out. Phlebotomist called this RN to ask for assistance. Upon assessment, pt is pale, diaphoretic, vital signs obtained, 88/45 BP, 98%O2, 132 HR, 24 RR. Pt given emesis bag, and fan provided. Pt began to feel better after lab was done drawing blood, BP obtained again, 97/56. Pt taken back to hallway bed for MD to see pt. RN in assignment made aware.

## 2019-12-06 NOTE — ED Provider Notes (Signed)
[facility] EMERGENCY DEPARTMENT Provider Note   CSN: [id number] Arrival date & time: 12/05/19  2302     History Chief Complaint  Patient presents with  . Abnormal Lab    Linda [name] is a 19 y.o. female.  The history is provided by the patient.  Emesis Severity:  Moderate Progression:  Improving Chronicity:  New Relieved by:  Nothing Worsened by:  Nothing Associated symptoms: no abdominal pain, no diarrhea and no fever      Patient history of diabetes presents for vomiting.  Patient reports earlier in the morning she had multiple episodes of nonbloody emesis.  No diarrhea.  No new abdominal pain.  No fevers.  No chest pain or shortness of breath.  She is diabetic and checked her urine for ketones and it was elevated.  She was advised by her endocrinologist to be evaluated in the ER.  She reports medication compliance Past Medical History:  Diagnosis Date  . Allergy   . Anxiety   . Asthma   . Carpal tunnel syndrome   . Depression   . Diabetes mellitus without complication (HCC) 2014   Type 1    Patient Active Problem List   Diagnosis Date Noted  . Irritable bowel syndrome with constipation 12/08/2018  . DKA (diabetic ketoacidoses) (HCC) 05/22/2018  . Depressive disorder 01/16/2018  . Panic attack 01/16/2018  . Acne vulgaris 10/29/2016  . 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 Asthma with acute exacerbation 09/21/2012    Past Surgical History:  Procedure Laterality Date  . ADENOIDECTOMY    . TONSILLECTOMY    . WISDOM TOOTH EXTRACTION       OB History    Gravida  0   Para  0   Term  0   Preterm  0   AB  0   Living  0     SAB  0   TAB  0   Ectopic  0   Multiple  0   Live Births  0           Family History  Problem Relation Age of Onset  . Hypertension Mother   . Asthma Sister   . Asthma Brother     Social History   Tobacco Use  . Smoking status: Passive Smoke Exposure - Never Smoker  .  Smokeless tobacco: Never Used  Substance Use Topics  . Alcohol use: No  . Drug use: No    Home Medications Prior to Admission medications   Medication Sig Start Date End Date Taking? Authorizing Provider  albuterol (VENTOLIN HFA) 108 (90 Base) MCG/ACT inhaler Inhale 2 puffs into the lungs every 6 (six) hours as needed for wheezing or shortness of breath. 06/27/19   Gwenlyn Fudge, FNP  busPIRone (BUSPAR) 15 MG tablet TAKE 1 TABLET (15 MG TOTAL) BY MOUTH 2 (TWO) TIMES DAILY. 09/07/19   Raliegh Ip, DO  citalopram (CELEXA) 20 MG tablet Take 1 tablet (20 mg total) by mouth daily. 02/12/19   Raliegh Ip, DO  EPINEPHrine 0.3 mg/0.3 mL IJ SOAJ injection Inject 0.3 mLs (0.3 mg total) into the muscle as needed for anaphylaxis. 01/17/19   Raliegh Ip, DO  fluconazole (DIFLUCAN) 150 MG tablet Take 1 tablet (150 mg total) by mouth every three (3) days as needed. 06/12/19   Junie Spencer, FNP  glucagon (GLUCAGON EMERGENCY) 1 MG injection Inject 1 mg into the muscle once as needed (for blood sugar).  [provider]  hydrOXYzine (ATARAX/VISTARIL) 25 MG tablet Take 0.5-1 tablets (12.5-25 mg total) by mouth every 8 (eight) hours as needed for anxiety or itching. 01/17/19   Janora Norlander, DO  Insulin Human (INSULIN PUMP) SOLN Inject into the skin continuous. Patient uses an omnipod insulin pump with novolog insulin; patient unaware of current basal rate but may bolus up to 90 units additionally every day (30 units with meals three times a day = 90 additional units)    [provider]  levonorgestrel-ethinyl estradiol (LILLOW) 0.15-30 MG-MCG tablet Take 1 tablet by mouth daily. 02/12/19   Janora Norlander, DO  loratadine (CLARITIN) 10 MG tablet Take 1 tablet (10 mg total) by mouth daily. 02/13/19   Janora Norlander, DO  omeprazole (PRILOSEC) 20 MG capsule Take 1 capsule (20 mg total) by mouth daily. 08/13/18   Little, Wenda Overland, MD  ondansetron (ZOFRAN ODT)  4 MG disintegrating tablet Take 1 tablet (4 mg total) by mouth every 8 (eight) hours as needed for nausea or vomiting. 05/20/19   Fransico Meadow, PA-C  PAZEO 0.7 % SOLN Apply 1 drop to eye every morning. 04/11/19   [provider]  polyethylene glycol powder (GLYCOLAX/MIRALAX) 17 GM/SCOOP powder Take 17 g by mouth 2 (two) times daily as needed. 12/08/18   Baruch Gouty, FNP    Allergies    Patient has no known allergies.  Review of Systems   Review of Systems  Constitutional: Negative for fever.  Respiratory: Negative for shortness of breath.   Cardiovascular: Negative for chest pain.  Gastrointestinal: Positive for vomiting. Negative for abdominal pain and diarrhea.  All other systems reviewed and are negative.   Physical Exam Updated Vital Signs BP 102/60 (BP Location: Right Arm)   Pulse 80   Temp 98.1 F (36.7 C) (Oral)   Resp 20   Ht 1.575 m (5\' 2" )   Wt 88 kg   SpO2 100%   BMI 35.48 kg/m   Physical Exam CONSTITUTIONAL: Well developed/well nourished HEAD: Normocephalic/atraumatic EYES: EOMI/PERRL ENMT: Mucous membranes moist NECK: supple no meningeal signs SPINE/BACK:entire spine nontender CV: S1/S2 noted, no murmurs/rubs/gallops noted LUNGS: Lungs are clear to auscultation bilaterally, no apparent distress ABDOMEN: soft, nontender, no rebound or guarding, bowel sounds noted throughout abdomen GU:no cva tenderness NEURO: Pt is awake/alert/appropriate, moves all extremitiesx4.  No facial droop.  EXTREMITIES: pulses normal/equal, full ROM SKIN: warm, color normal PSYCH: no abnormalities of mood noted, alert and oriented to situation  ED Results / Procedures / Treatments   Labs (all labs ordered are listed, but only abnormal results are displayed) Labs Reviewed  CBC - Abnormal; Notable for the following components:      Result Value   RDW 11.3 (*)    All other components within normal limits  COMPREHENSIVE METABOLIC PANEL - Abnormal; Notable for the  following components:   Glucose, Bld 283 (*)    AST 12 (*)    All other components within normal limits  URINALYSIS, ROUTINE W REFLEX MICROSCOPIC - Abnormal; Notable for the following components:   Specific Gravity, Urine 1.033 (*)    Glucose, UA >=500 (*)    Ketones, ur 80 (*)    All other components within normal limits  CBG MONITORING, ED - Abnormal; Notable for the following components:   Glucose-Capillary 254 (*)    All other components within normal limits  CBG MONITORING, ED - Abnormal; Notable for the following components:   Glucose-Capillary 240 (*)    All  other components within normal limits  PREGNANCY, URINE    EKG EKG Interpretation  Date/Time:  Thursday December 06 2019 02:17:42 EDT Ventricular Rate:  85 PR Interval:    QRS Duration: 85 QT Interval:  375 QTC Calculation: 446 R Axis:   42 Text Interpretation: Sinus rhythm Low voltage, precordial leads No significant change since last tracing Confirmed by Zadie Rhine ([id number]) on 12/06/2019 2:22:29 AM   Radiology No results found.  Procedures Procedures    Medications Ordered in ED Medications  sodium chloride 0.9 % bolus 1,000 mL (0 mLs Intravenous Stopped 12/06/19 0116)  lactated ringers bolus 1,000 mL (0 mLs Intravenous Stopped 12/06/19 0204)    ED Course  I have reviewed the triage vital signs and the nursing notes.  Pertinent labs  results that were available during my care of the patient were reviewed by me and considered in my medical decision making (see chart for details).    MDM Rules/Calculators/A&P                      She was advised to go to the ER due to vomiting, hyperglycemia and ketones in her urine She is well-appearing.  She does not appear to be in DKA.  She is mildly dehydrated.  She is taking p.o. and ambulatory. She has been given IV fluids. We will discharge home. Final Clinical Impression(s) / ED Diagnoses Final diagnoses:  Hyperglycemia  Dehydration    Rx / DC Orders ED  Discharge Orders    None       Zadie Rhine, MD 12/06/19 [phone]

## 2019-12-06 NOTE — ED Notes (Signed)
Pt ambulated. Tolerated po

## 2019-12-06 NOTE — ED Notes (Signed)
Pt questioning when she gets to go home . Informed her edp will be by soon

## 2019-12-24 DIAGNOSIS — E109 Type 1 diabetes mellitus without complications: Secondary | ICD-10-CM | POA: Diagnosis not present

## 2020-01-10 ENCOUNTER — Ambulatory Visit: Payer: Medicaid Other | Admitting: Nurse Practitioner

## 2020-01-10 ENCOUNTER — Encounter: Payer: Self-pay | Admitting: Family Medicine

## 2020-01-29 DIAGNOSIS — E1065 Type 1 diabetes mellitus with hyperglycemia: Secondary | ICD-10-CM | POA: Diagnosis not present

## 2020-01-29 DIAGNOSIS — Z794 Long term (current) use of insulin: Secondary | ICD-10-CM | POA: Diagnosis not present

## 2020-02-01 IMAGING — US RIGHT UPPER EXTREMITY VENOUS ULTRASOUND
1 series · 13 of 24 positions shown · non-contrast
Comparison: None.

CLINICAL DATA: Right upper extremity pain and swelling



[Series 1: right upper extremity venous ultrasound · 0.07mm/px · 13 of 39 slices shown]
[im 1/39]
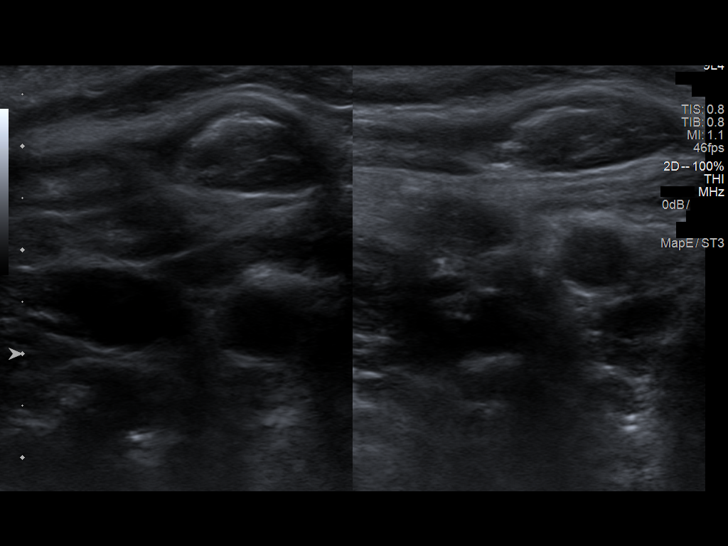
[im 4/39]
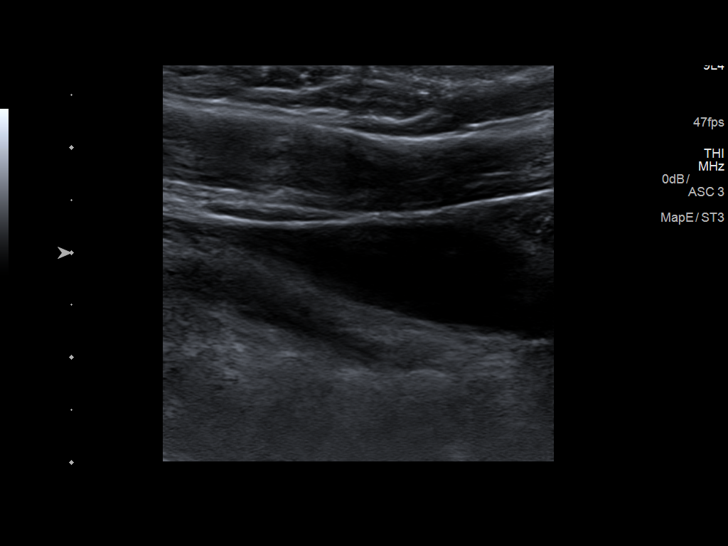
[im 7/39]
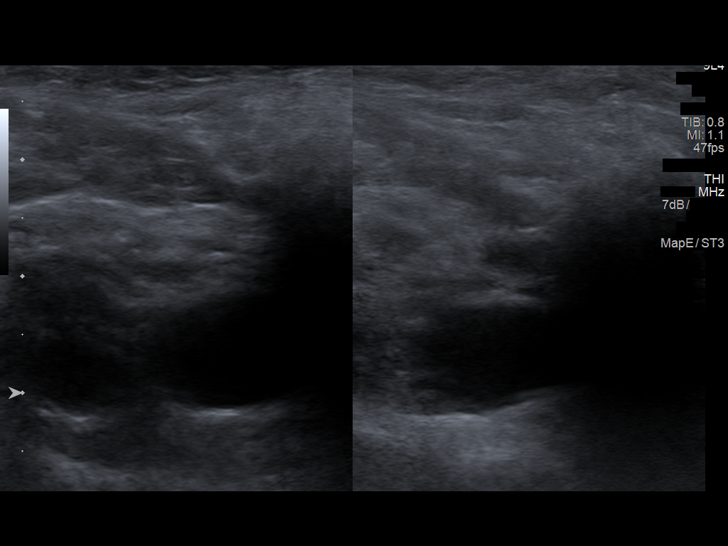
[im 10/39]
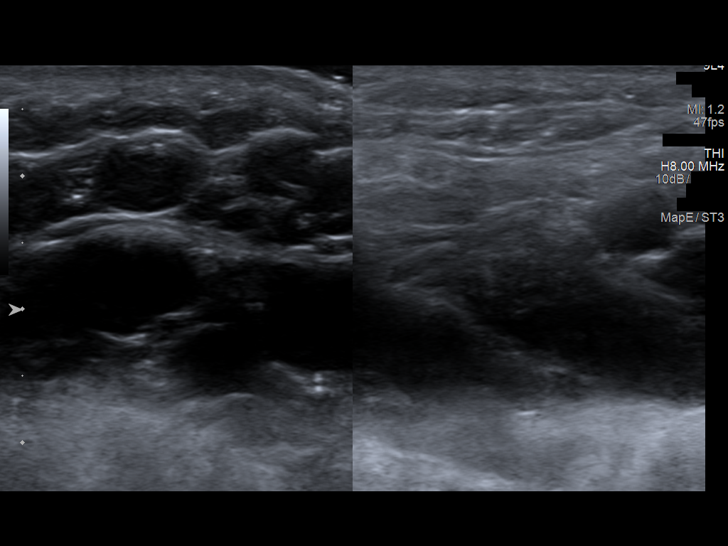
[im 14/39]
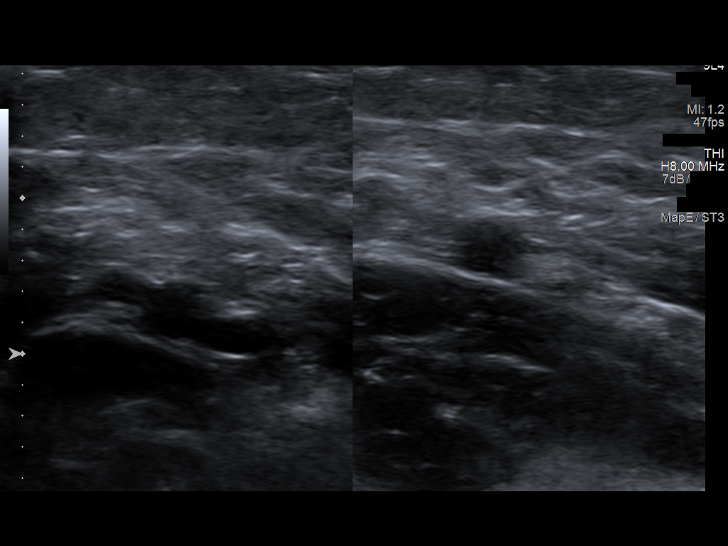
[im 17/39]
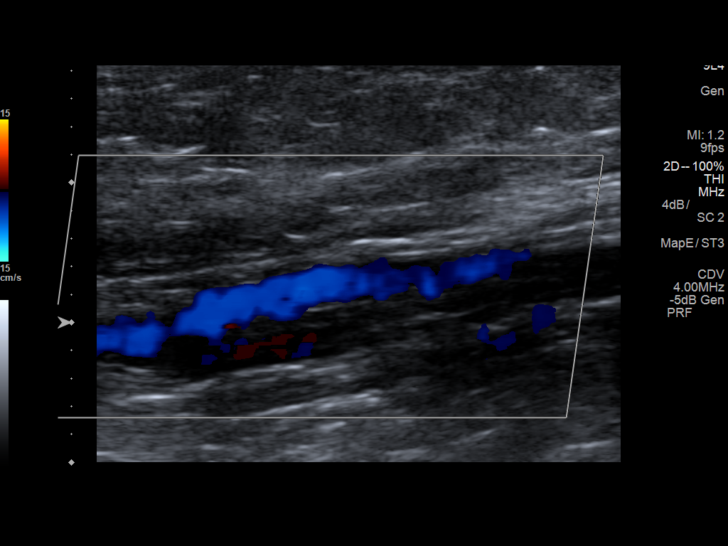
[im 20/39]
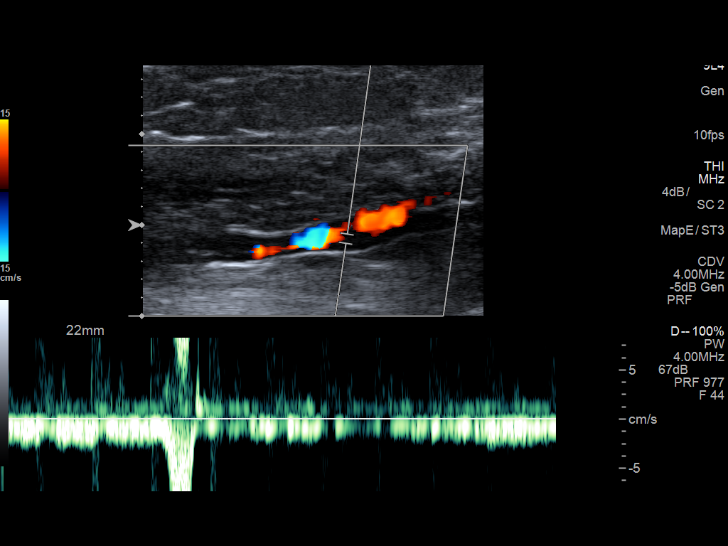
[im 22/39]
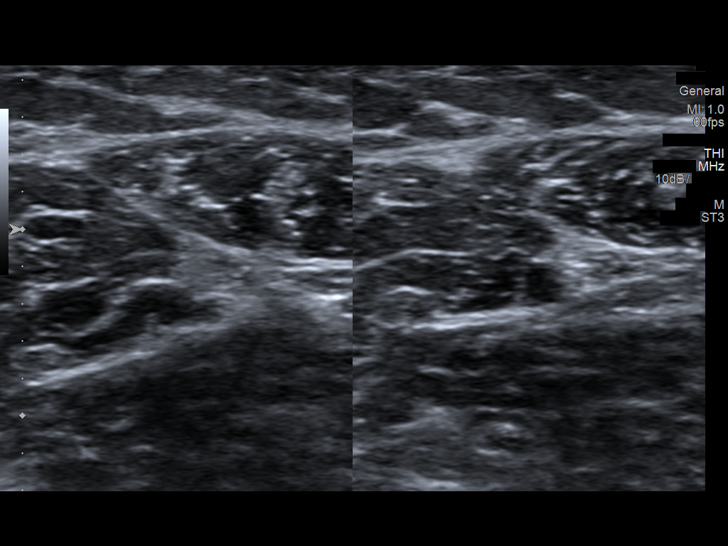
[im 25/39]
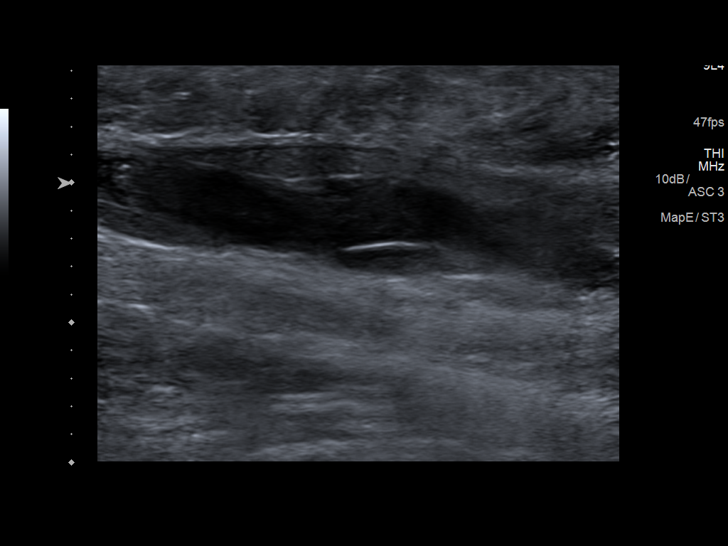
[im 29/39]
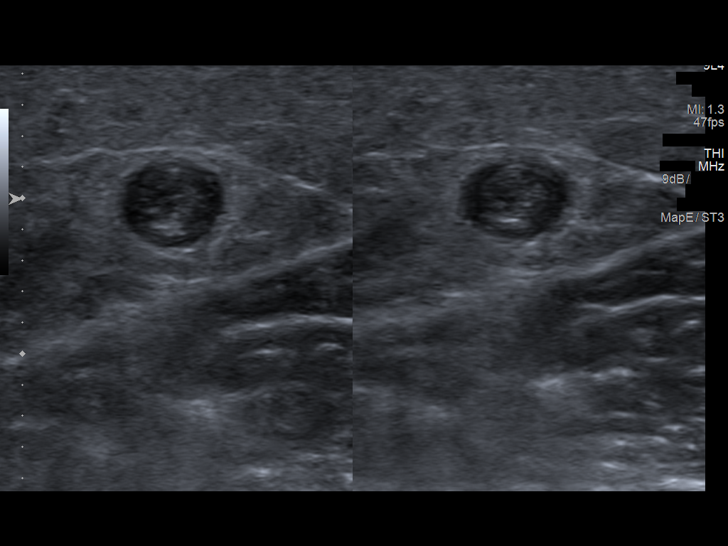
[im 32/39]
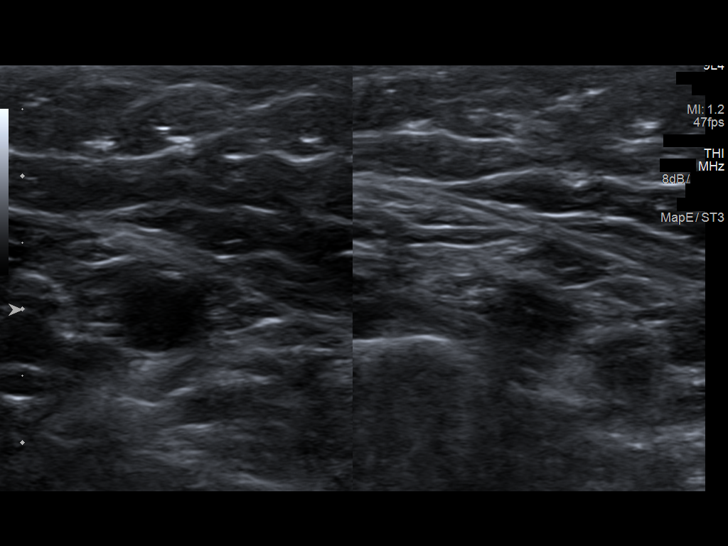
[im 35/39]
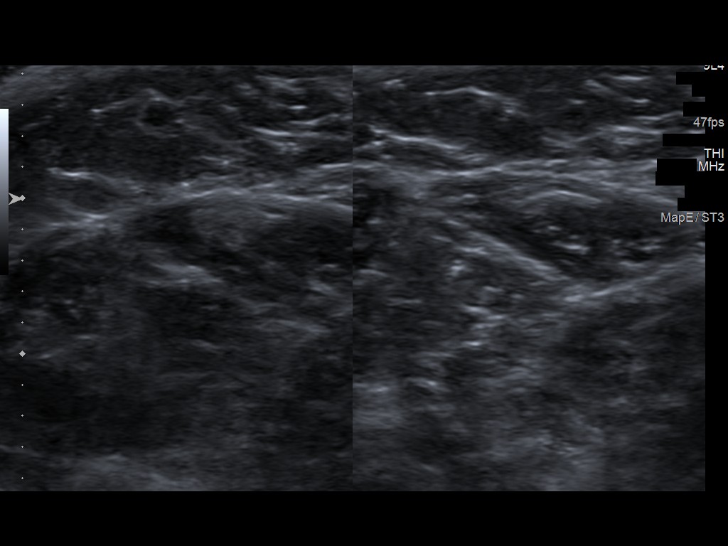
[im 39/39]
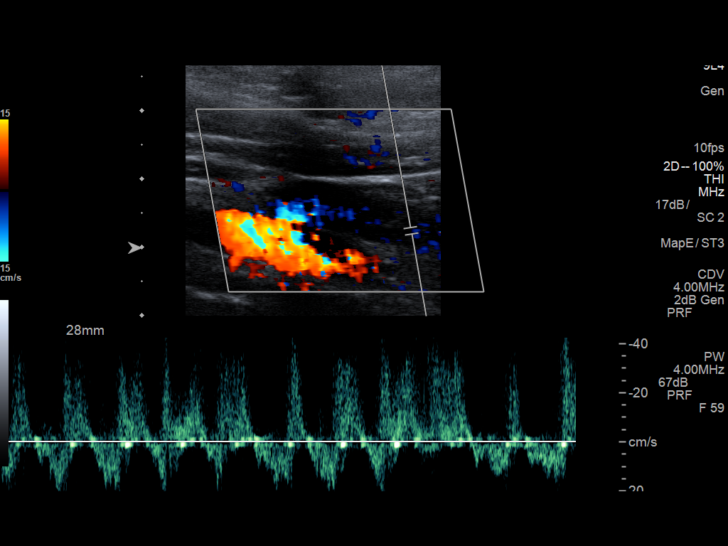

[13 of 24 positions shown; findings below may reference images not displayed]

FINDINGS: Contralateral Subclavian Vein: Respiratory phasicity is normal and
symmetric with the symptomatic side. No evidence of thrombus. Normal
compressibility.

Internal Jugular Vein: No evidence of thrombus. Normal
compressibility, respiratory phasicity and response to augmentation.

Subclavian Vein: No evidence of thrombus. Normal compressibility,
respiratory phasicity and response to augmentation.

Axillary Vein: No evidence of thrombus. Normal compressibility,
respiratory phasicity and response to augmentation.

Cephalic Vein: Right cephalic vein demonstrates intraluminal
thrombus and is noncompressible in the upper arm and antecubital
fossa region compatible with superficial
thrombosis/thrombophlebitis. This is in the region of previous IV
site according to the patient.

Basilic Vein: No evidence of thrombus. Normal compressibility,
respiratory phasicity and response to augmentation.

Brachial Veins: No evidence of thrombus. Normal compressibility,
respiratory phasicity and response to augmentation.

Radial Veins: No evidence of thrombus. Normal compressibility,
respiratory phasicity and response to augmentation.

Ulnar Veins: No evidence of thrombus. Normal compressibility,
respiratory phasicity and response to augmentation.

Venous Reflux:  None visualized.

Other Findings:  None visualized.
IMPRESSION: Negative for significant right upper extremity DVT.

Right cephalic vein superficial thrombosis/thrombophlebitis.

## 2020-02-03 ENCOUNTER — Other Ambulatory Visit: Payer: Self-pay | Admitting: Family Medicine

## 2020-02-03 DIAGNOSIS — N926 Irregular menstruation, unspecified: Secondary | ICD-10-CM

## 2020-02-03 DIAGNOSIS — L2489 Irritant contact dermatitis due to other agents: Secondary | ICD-10-CM

## 2020-02-03 DIAGNOSIS — F410 Panic disorder [episodic paroxysmal anxiety] without agoraphobia: Secondary | ICD-10-CM

## 2020-02-03 DIAGNOSIS — F32A Depression, unspecified: Secondary | ICD-10-CM

## 2020-02-03 DIAGNOSIS — F329 Major depressive disorder, single episode, unspecified: Secondary | ICD-10-CM

## 2020-02-13 ENCOUNTER — Other Ambulatory Visit: Payer: Self-pay | Admitting: Family Medicine

## 2020-02-13 DIAGNOSIS — F410 Panic disorder [episodic paroxysmal anxiety] without agoraphobia: Secondary | ICD-10-CM

## 2020-02-13 DIAGNOSIS — F32A Depression, unspecified: Secondary | ICD-10-CM

## 2020-02-25 IMAGING — DX LUMBAR SPINE - 2-3 VIEW
2 series · 2 of 2 positions shown · non-contrast
Comparison: None.

CLINICAL DATA: Low back pain

EXAM:
LUMBAR SPINE - 2-3 VIEW

[l-spine ap]
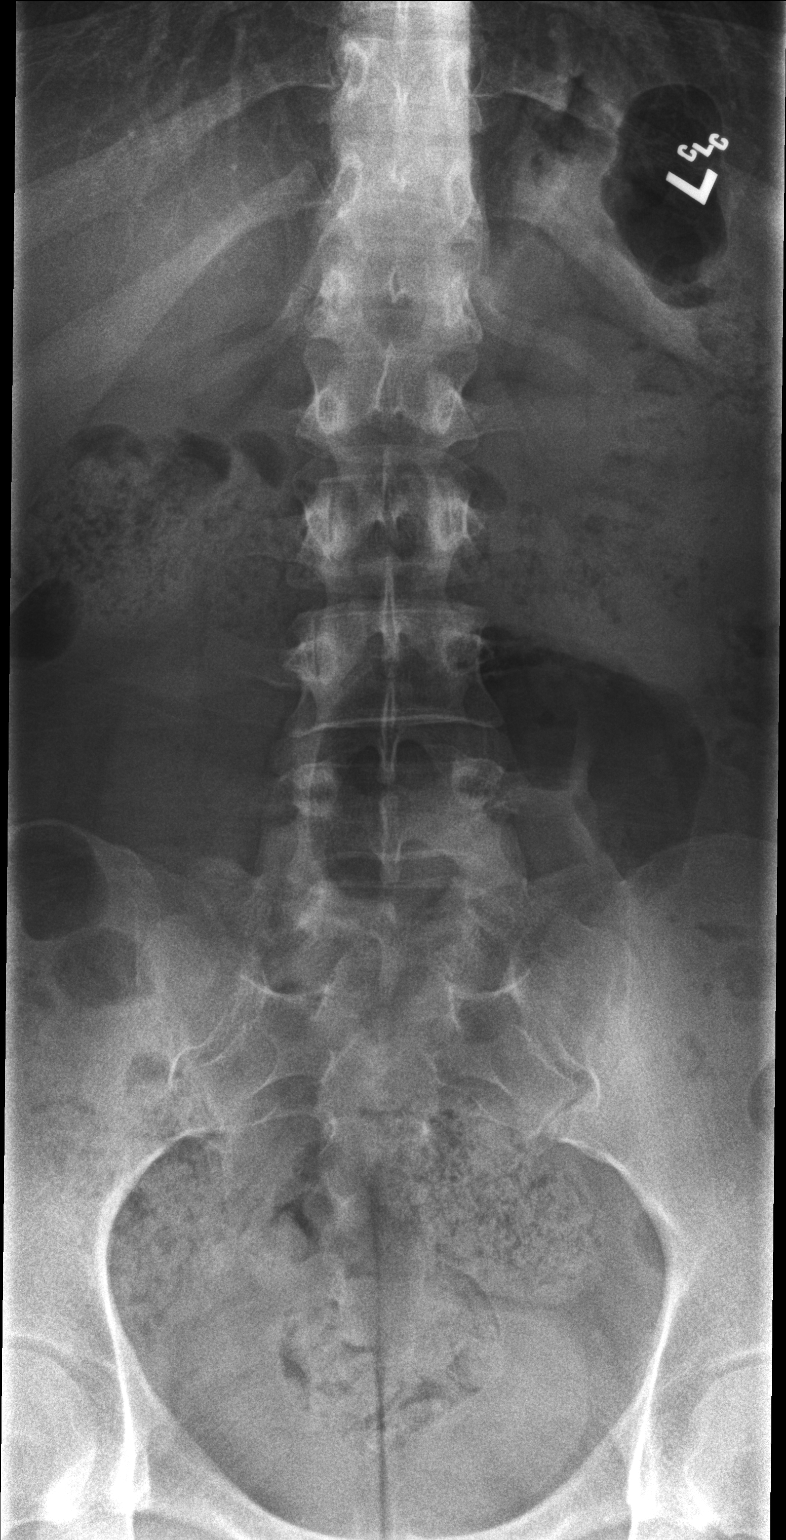

[l-spine lat]
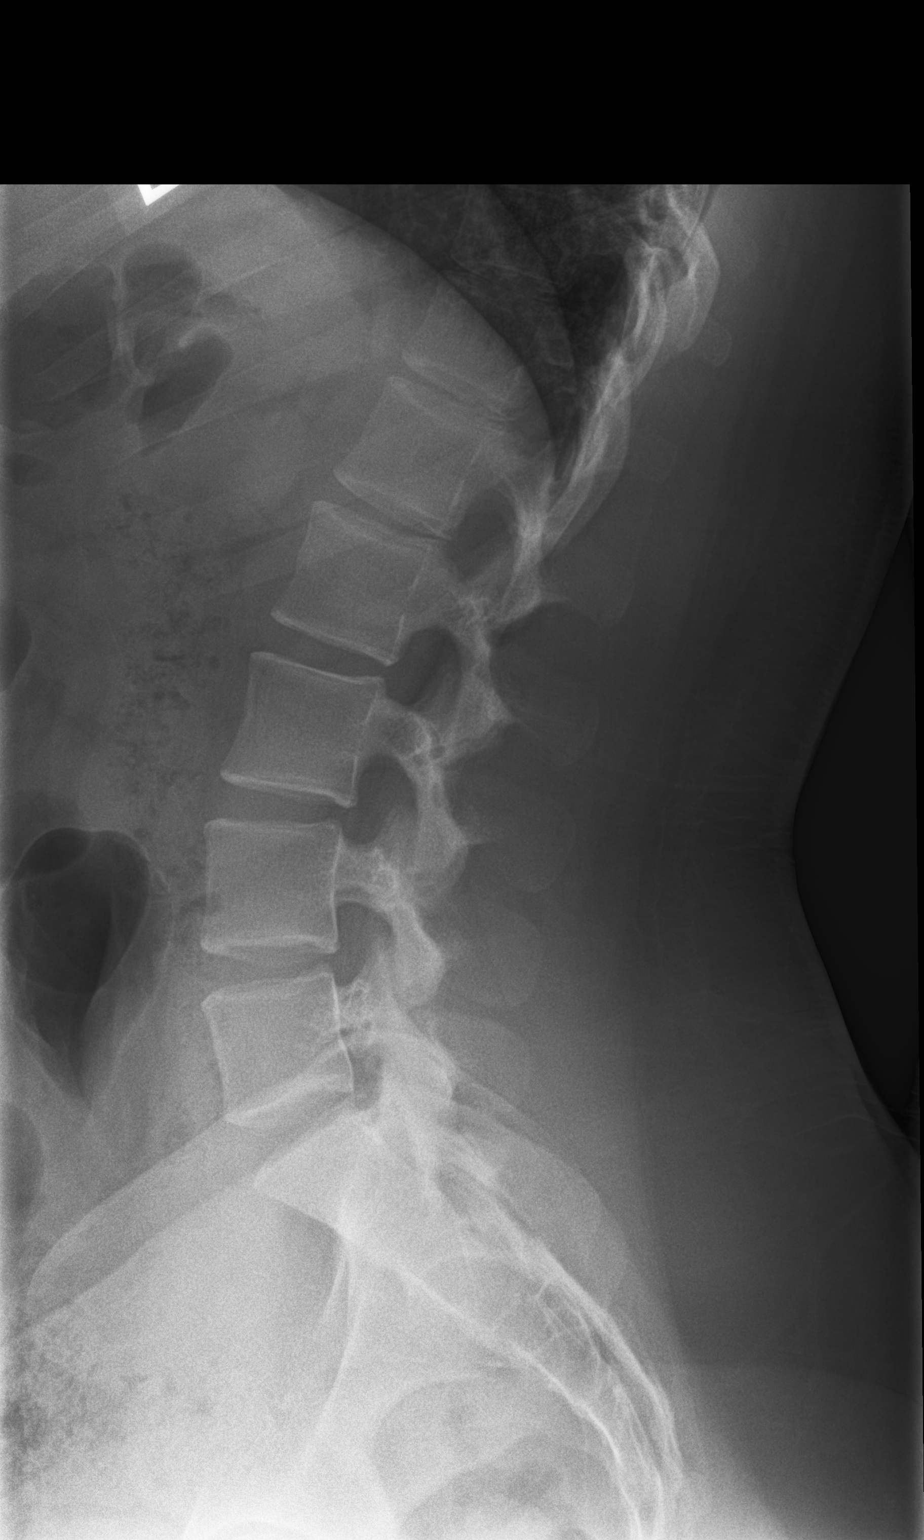

[2 of 2 positions shown; findings below may reference images not displayed]

FINDINGS: Frontal and lateral views were obtained. There are 4 non-rib-bearing
lumbar type vertebral bodies. T12 ribs are hypoplastic. There is no
fracture or spondylolisthesis. The disc spaces appear unremarkable.
No appreciable facet arthropathy.
IMPRESSION: No fracture or spondylolisthesis. No appreciable arthropathic
change.

## 2020-03-03 ENCOUNTER — Other Ambulatory Visit: Payer: Self-pay | Admitting: Family Medicine

## 2020-03-03 DIAGNOSIS — F410 Panic disorder [episodic paroxysmal anxiety] without agoraphobia: Secondary | ICD-10-CM

## 2020-03-03 DIAGNOSIS — F32A Depression, unspecified: Secondary | ICD-10-CM

## 2020-03-03 DIAGNOSIS — E109 Type 1 diabetes mellitus without complications: Secondary | ICD-10-CM | POA: Diagnosis not present

## 2020-03-03 DIAGNOSIS — N926 Irregular menstruation, unspecified: Secondary | ICD-10-CM

## 2020-03-05 ENCOUNTER — Ambulatory Visit (INDEPENDENT_AMBULATORY_CARE_PROVIDER_SITE_OTHER): Payer: Medicaid Other | Admitting: Family Medicine

## 2020-03-05 ENCOUNTER — Other Ambulatory Visit: Payer: Self-pay

## 2020-03-05 VITALS — BP 128/84 | HR 105 | Temp 97.6°F | Ht 62.0 in | Wt 199.0 lb

## 2020-03-05 DIAGNOSIS — F32A Depression, unspecified: Secondary | ICD-10-CM

## 2020-03-05 DIAGNOSIS — L2381 Allergic contact dermatitis due to animal (cat) (dog) dander: Secondary | ICD-10-CM

## 2020-03-05 DIAGNOSIS — F411 Generalized anxiety disorder: Secondary | ICD-10-CM

## 2020-03-05 DIAGNOSIS — F329 Major depressive disorder, single episode, unspecified: Secondary | ICD-10-CM | POA: Diagnosis not present

## 2020-03-05 DIAGNOSIS — J302 Other seasonal allergic rhinitis: Secondary | ICD-10-CM

## 2020-03-05 MED ORDER — HYDROXYZINE HCL 25 MG PO TABS: 12.5000 mg | ORAL_TABLET | Freq: Three times a day (TID) | ORAL | 6 refills | Status: DC | PRN

## 2020-03-05 NOTE — Patient Instructions (Signed)
Ok to increase Buspirone to 2 tablets each morning Continue everything else the same for now You will be seeing Tresa Moore, FNP tomorrow for genetic testing to see if there are better medications out there for you for depression/ anxiety We should consider referral to psychiatry if we cannot make sufficient impact in your symptoms (you can request this any time).

## 2020-03-05 NOTE — Progress Notes (Signed)
Subjective: CC: Depression/ panic PCP: Raliegh Ip, DO DJM:EQASTM Linda [name] is a 19 y.o. female presenting to clinic today for:  1. Depression/ panic Summary/ History: Longstanding history of panic and depressive symptoms.  Previously seen by [facility].  She has had suicidal ideation but never any suicidal attempts.  Family history is significant for bipolar disorder in her maternal aunt, anxiety disorder in her mother and grandmother, drug use in a sibling.  Patient has been intermittently noncompliant with Celexa.  Though she notes that over the last couple of weeks she has been taking it daily.  She cannot remember to take the evening dose of BuSpar but is taking it during the morning.  She is not seeing a counselor regularly because she has not found this to be especially useful in the past.  She is never seen a psychiatrist but would be willing to see one if her symptoms do not improve with medication here in our office.  She has run out of her hydroxyzine and would like to get a renewal on this as she is needing it several nights per week lately.  She also reports that she is no longer on the insulin pump and her endocrinologist switched her to injected NovoLog and [place] and her blood sugars seem to be doing better from that standpoint.  She admits to me that she has had some suicidal thoughts but has had no plans to go through with suicide.   ROS: Per HPI  No Known Allergies Past Medical History:  Diagnosis Date  . Allergy   . Anxiety   . Asthma   . Carpal tunnel syndrome   . Depression   . Diabetes mellitus without complication (HCC) 2014   Type 1    Current Outpatient Medications:  .  busPIRone (BUSPAR) 15 MG tablet, Take 1 tablet (15 mg total) by mouth 2 (two) times daily., Disp: 60 tablet, Rfl: 0 .  citalopram (CELEXA) 20 MG tablet, TAKE 1 TABLET BY MOUTH EVERY DAY, Disp: 30 tablet, Rfl: 0 .  EPINEPHrine 0.3 mg/0.3 mL IJ SOAJ injection,  Inject 0.3 mLs (0.3 mg total) into the muscle as needed for anaphylaxis., Disp: 1 each, Rfl: 0 .  glucagon (GLUCAGON EMERGENCY) 1 MG injection, Inject 1 mg into the muscle once as needed (for blood sugar). , Disp: , Rfl:  .  hydrOXYzine (ATARAX/VISTARIL) 25 MG tablet, Take 0.5-1 tablets (12.5-25 mg total) by mouth every 8 (eight) hours as needed for anxiety or itching., Disp: 30 tablet, Rfl: 1 .  insulin degludec (TRESIBA FLEXTOUCH) 100 UNIT/ML FlexTouch Pen, Inject 60 units daily as directed, Disp: , Rfl:  .  levonorgestrel-ethinyl estradiol (ALTAVERA) 0.15-30 MG-MCG tablet, Take 1 tablet by mouth daily., Disp: 28 tablet, Rfl: 0 .  NOVOLOG FLEXPEN 100 UNIT/ML FlexPen, SMARTSIG:0-90 Unit(s) SUB-Q Daily, Disp: , Rfl:  .  omeprazole (PRILOSEC) 20 MG capsule, Take 1 capsule (20 mg total) by mouth daily., Disp: 7 capsule, Rfl: 0 .  ondansetron (ZOFRAN ODT) 4 MG disintegrating tablet, Take 1 tablet (4 mg total) by mouth every 8 (eight) hours as needed for nausea or vomiting., Disp: 20 tablet, Rfl: 0 .  polyethylene glycol powder (GLYCOLAX/MIRALAX) 17 GM/SCOOP powder, Take 17 g by mouth 2 (two) times daily as needed., Disp: 3350 g, Rfl: 3 Social History   Socioeconomic History  . Marital status: Single    Spouse name: Not on file  . Number of children: Not on file  . Years of education: Not  on file  . Highest education level: Not on file  Occupational History  . Not on file  Tobacco Use  . Smoking status: Passive Smoke Exposure - Never Smoker  . Smokeless tobacco: Never Used  Vaping Use  . Vaping Use: Never used  Substance and Sexual Activity  . Alcohol use: No  . Drug use: No  . Sexual activity: Yes    Birth control/protection: Condom, Pill  Other Topics Concern  . Not on file  Social History Narrative  . Not on file   Social Determinants of Health   Financial Resource Strain:   . Difficulty of Paying Living Expenses: Not on file  Food Insecurity:   . Worried About Brewing technologist in the Last Year: Not on file  . Ran Out of Food in the Last Year: Not on file  Transportation Needs:   . Lack of Transportation (Medical): Not on file  . Lack of Transportation (Non-Medical): Not on file  Physical Activity:   . Days of Exercise per Week: Not on file  . Minutes of Exercise per Session: Not on file  Stress:   . Feeling of Stress : Not on file  Social Connections:   . Frequency of Communication with Friends and Family: Not on file  . Frequency of Social Gatherings with Friends and Family: Not on file  . Attends Religious Services: Not on file  . Active Member of Clubs or Organizations: Not on file  . Attends Banker Meetings: Not on file  . Marital Status: Not on file  Intimate Partner Violence:   . Fear of Current or Ex-Partner: Not on file  . Emotionally Abused: Not on file  . Physically Abused: Not on file  . Sexually Abused: Not on file   Family History  Problem Relation Age of Onset  . Hypertension Mother   . Asthma Sister   . Asthma Brother     Objective: Office vital signs reviewed. BP 128/84   Pulse (!) 105   Temp 97.6 F (36.4 C) (Temporal)   Ht 5\' 2"  (1.575 m)   Wt 199 lb (90.3 kg)   LMP 02/24/2020 (Exact Date)   SpO2 99%   BMI 36.40 kg/m   Physical Examination:  General: Awake, alert, well nourished, No acute distress HEENT: Normal, sclera white, MMM Cardio: RRR, S1S2 heard. No murmurs Pulm: CTAB, normal work of breathing on room air Psych: Mood stable. affect appropriate, pleasant, interactive, does not appear to be responding to internal stimuli.  Good eye contact. Thought process linear. Has good insight  Depression screen [facility] 2/9 03/05/2020 01/17/2019 01/02/2019  Decreased Interest 3 1 1   Down, Depressed, Hopeless 3 1 1   PHQ - 2 Score 6 2 2   Altered sleeping 3 2 2   Tired, decreased energy 1 0 1  Change in appetite 2 0 1  Feeling bad or failure about yourself  3 1 0  Trouble concentrating 3 0 0  Moving slowly or  fidgety/restless 1 0 1  Suicidal thoughts 2 0 0  PHQ-9 Score 21 5 7   Difficult doing work/chores Very difficult - Somewhat difficult  Some recent data might be hidden   GAD 7 : Generalized Anxiety Score 03/05/2020 01/17/2019 12/19/2018 12/05/2018  Nervous, Anxious, on Edge 3 1 1 2   Control/stop worrying 3 1 1 2   Worry too much - different things 3 1 1 1   Trouble relaxing 3 0 1 0  Restless 3 0 0 0  Easily annoyed or  irritable 3 0 0 0  Afraid - awful might happen 3 2 1 2   Total GAD 7 Score 21 5 5 7   Anxiety Difficulty Very difficult Not difficult at all Not difficult at all Somewhat difficult    Assessment/ Plan: 19 y.o. female   1. Uncontrolled depression Not well controlled despite moderate compliance with medication.  We discussed consideration for psychiatry versus GeneSight testing.  She is interested in gene site testing and we will arrange this to be performed tomorrow with .  We will plan medication regimen pending this outcome.  She has had suicidal thoughts but has not had any attempts.  Verbal contract for safety.  She has the crisis hotlines if needed.  Declined any counseling services as she has not found this helpful in the past.  I think that she is safe to be discharged home but have encouraged her to contact me should any other concerns arise prior to her visit tomorrow  2. Generalized anxiety disorder For now, continue Atarax as needed, okay to take BuSpar 30 mg daily since she cannot remember the twice daily dosing  3. Allergic contact dermatitis due to animal (cat) (dog) dander - hydrOXYzine (ATARAX/VISTARIL) 25 MG tablet; Take 0.5-1 tablets (12.5-25 mg total) by mouth every 8 (eight) hours as needed for anxiety.  Dispense: 30 tablet; Refill: 6  4. Seasonal allergies - hydrOXYzine (ATARAX/VISTARIL) 25 MG tablet; Take 0.5-1 tablets (12.5-25 mg total) by mouth every 8 (eight) hours as needed for anxiety.  Dispense: 30 tablet; Refill: 6    No orders of the  defined types were placed in this encounter.  08-[birth year]-1984, DO [facility] [phone]

## 2020-03-06 ENCOUNTER — Ambulatory Visit (INDEPENDENT_AMBULATORY_CARE_PROVIDER_SITE_OTHER): Payer: Medicaid Other | Admitting: Family Medicine

## 2020-03-06 ENCOUNTER — Other Ambulatory Visit: Payer: Self-pay

## 2020-03-06 ENCOUNTER — Encounter: Payer: Self-pay | Admitting: Family Medicine

## 2020-03-06 VITALS — BP 127/89 | HR 106 | Temp 97.2°F | Ht 62.0 in | Wt 199.4 lb

## 2020-03-06 DIAGNOSIS — F332 Major depressive disorder, recurrent severe without psychotic features: Secondary | ICD-10-CM | POA: Diagnosis not present

## 2020-03-06 DIAGNOSIS — F411 Generalized anxiety disorder: Secondary | ICD-10-CM

## 2020-03-06 NOTE — Progress Notes (Signed)
Assessment & Plan:  1-2. 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  GeneSight testing completed today.   Follow up plan: Return in about 2 months (around 05/06/2020) for anxiety/depression w. PCP.  Deliah Boston, MSN, APRN, FNP-C [facility]  Subjective:   Patient ID: Linda [name], female    DOB: 2000-09-22, 19 y.o.   MRN: [id number]  HPI: Linda [name] is a 19 y.o. female presenting on 03/06/2020 for genesight  Patient is here for GeneSight testing due to uncontrolled depression and anxiety.  She is currently on buspirone and citalopram which she feels is not helping.  She reports she previously failed therapy with Prozac, which caused a worsening of her depression.  Depression screen [facility] 2/9 03/05/2020 01/17/2019 01/02/2019  Decreased Interest 3 1 1   Down, Depressed, Hopeless 3 1 1   PHQ - 2 Score 6 2 2   Altered sleeping 3 2 2   Tired, decreased energy 1 0 1  Change in appetite 2 0 1  Feeling bad or failure about yourself  3 1 0  Trouble concentrating 3 0 0  Moving slowly or fidgety/restless 1 0 1  Suicidal thoughts 2 0 0  PHQ-9 Score 21 5 7   Difficult doing work/chores Very difficult - Somewhat difficult  Some recent data might be hidden   GAD 7 : Generalized Anxiety Score 03/05/2020 01/17/2019 12/19/2018 12/05/2018  Nervous, Anxious, on Edge 3 1 1 2   Control/stop worrying 3 1 1 2   Worry too much - different things 3 1 1 1   Trouble relaxing 3 0 1 0  Restless 3 0 0 0  Easily annoyed or irritable 3 0 0 0  Afraid - awful might happen 3 2 1 2   Total GAD 7 Score 21 5 5 7   Anxiety Difficulty Very difficult Not difficult at all Not difficult at all Somewhat difficult    ROS: Negative unless specifically indicated above in HPI.   Relevant past medical history reviewed and updated as indicated.   Allergies and medications reviewed and updated.   Current Outpatient  Medications:  .  busPIRone (BUSPAR) 15 MG tablet, Take 1 tablet (15 mg total) by mouth 2 (two) times daily., Disp: 60 tablet, Rfl: 0 .  citalopram (CELEXA) 20 MG tablet, TAKE 1 TABLET BY MOUTH EVERY DAY, Disp: 30 tablet, Rfl: 0 .  EPINEPHrine 0.3 mg/0.3 mL IJ SOAJ injection, Inject 0.3 mLs (0.3 mg total) into the muscle as needed for anaphylaxis., Disp: 1 each, Rfl: 0 .  glucagon (GLUCAGON EMERGENCY) 1 MG injection, Inject 1 mg into the muscle once as needed (for blood sugar). , Disp: , Rfl:  .  hydrOXYzine (ATARAX/VISTARIL) 25 MG tablet, Take 0.5-1 tablets (12.5-25 mg total) by mouth every 8 (eight) hours as needed for anxiety., Disp: 30 tablet, Rfl: 6 .  insulin degludec (TRESIBA FLEXTOUCH) 100 UNIT/ML FlexTouch Pen, Inject 60 units daily as directed, Disp: , Rfl:  .  levonorgestrel-ethinyl estradiol (ALTAVERA) 0.15-30 MG-MCG tablet, Take 1 tablet by mouth daily., Disp: 28 tablet, Rfl: 0 .  NOVOLOG FLEXPEN 100 UNIT/ML FlexPen, SMARTSIG:0-90 Unit(s) SUB-Q Daily, Disp: , Rfl:  .  omeprazole (PRILOSEC) 20 MG capsule, Take 1 capsule (20 mg total) by mouth daily., Disp: 7 capsule, Rfl: 0 .  ondansetron (ZOFRAN ODT) 4 MG disintegrating tablet, Take 1 tablet (4 mg total) by mouth every 8 (eight) hours as needed for nausea or vomiting., Disp: 20 tablet, Rfl: 0 .  polyethylene glycol powder (GLYCOLAX/MIRALAX) 17  GM/SCOOP powder, Take 17 g by mouth 2 (two) times daily as needed., Disp: 3350 g, Rfl: 3  No Known Allergies  Objective:   BP 127/89   Pulse (!) 106   Temp (!) 97.2 F (36.2 C) (Temporal)   Ht 5\' 2"  (1.575 m)   Wt 199 lb 6.4 oz (90.4 kg)   LMP 02/24/2020 (Exact Date)   SpO2 93%   BMI 36.47 kg/m    Physical Exam Vitals reviewed.  Constitutional:      General: She is not in acute distress.    Appearance: Normal appearance. She is obese. She is not ill-appearing, toxic-appearing or diaphoretic.  HENT:     Head: Normocephalic and atraumatic.  Eyes:     General: No scleral icterus.         Right eye: No discharge.        Left eye: No discharge.     Conjunctiva/sclera: Conjunctivae normal.  Cardiovascular:     Rate and Rhythm: Normal rate.  Pulmonary:     Effort: Pulmonary effort is normal. No respiratory distress.  Musculoskeletal:        General: Normal range of motion.     Cervical back: Normal range of motion.  Skin:    General: Skin is warm and dry.     Capillary Refill: Capillary refill takes less than 2 seconds.  Neurological:     General: No focal deficit present.     Mental Status: She is alert and oriented to person, place, and time. Mental status is at baseline.  Psychiatric:        Mood and Affect: Mood normal.        Behavior: Behavior normal.        Thought Content: Thought content normal.        Judgment: Judgment normal.

## 2020-03-09 ENCOUNTER — Encounter: Payer: Self-pay | Admitting: Family Medicine

## 2020-03-29 ENCOUNTER — Other Ambulatory Visit: Payer: Self-pay | Admitting: Family Medicine

## 2020-03-29 DIAGNOSIS — N926 Irregular menstruation, unspecified: Secondary | ICD-10-CM

## 2020-04-01 ENCOUNTER — Telehealth: Payer: Self-pay | Admitting: Family Medicine

## 2020-04-01 DIAGNOSIS — N926 Irregular menstruation, unspecified: Secondary | ICD-10-CM

## 2020-04-01 NOTE — Telephone Encounter (Signed)
Pt aware refill sent to pharmacy yesterday 

## 2020-04-01 NOTE — Telephone Encounter (Signed)
  Prescription Request  04/01/2020  What is the name of the medication or equipment? Altavera  Have you contacted your pharmacy to request a refill? (if applicable) Yes  Which pharmacy would you like this sent to? [facility]   Patient notified that their request is being sent to the clinical staff for review and that they should receive a response within 2 business days.   Pt was just seen with Gottschalk.

## 2020-04-06 ENCOUNTER — Other Ambulatory Visit: Payer: Self-pay | Admitting: Family Medicine

## 2020-04-06 DIAGNOSIS — F410 Panic disorder [episodic paroxysmal anxiety] without agoraphobia: Secondary | ICD-10-CM

## 2020-04-06 DIAGNOSIS — F32A Depression, unspecified: Secondary | ICD-10-CM

## 2020-04-07 ENCOUNTER — Telehealth: Payer: Self-pay | Admitting: Family Medicine

## 2020-04-07 MED ORDER — DESVENLAFAXINE SUCCINATE ER 25 MG PO TB24: 1.0000 | ORAL_TABLET | Freq: Every day | ORAL | 0 refills | Status: DC

## 2020-04-07 NOTE — Telephone Encounter (Signed)
GeneSight Results show good response to Pristiq, Viibryd, Buspar, Geodon, Seroquel, Latuda, Depakote.  Stop Celexa.  Start Pristiq 25 mg daily.  Follow-up with me in 4 weeks as scheduled.  Call me sooner if needed

## 2020-05-06 ENCOUNTER — Other Ambulatory Visit: Payer: Self-pay

## 2020-05-06 ENCOUNTER — Encounter: Payer: Self-pay | Admitting: Family Medicine

## 2020-05-06 ENCOUNTER — Ambulatory Visit (INDEPENDENT_AMBULATORY_CARE_PROVIDER_SITE_OTHER): Payer: Medicaid Other | Admitting: Family Medicine

## 2020-05-06 ENCOUNTER — Ambulatory Visit: Payer: Medicaid Other | Admitting: Nurse Practitioner

## 2020-05-06 VITALS — BP 116/80 | HR 124 | Temp 98.1°F | Ht 62.0 in | Wt 198.2 lb

## 2020-05-06 DIAGNOSIS — F411 Generalized anxiety disorder: Secondary | ICD-10-CM

## 2020-05-06 DIAGNOSIS — Z319 Encounter for procreative management, unspecified: Secondary | ICD-10-CM

## 2020-05-06 DIAGNOSIS — F32A Depression, unspecified: Secondary | ICD-10-CM

## 2020-05-06 DIAGNOSIS — F329 Major depressive disorder, single episode, unspecified: Secondary | ICD-10-CM | POA: Diagnosis not present

## 2020-05-06 DIAGNOSIS — N926 Irregular menstruation, unspecified: Secondary | ICD-10-CM

## 2020-05-06 DIAGNOSIS — Z23 Encounter for immunization: Secondary | ICD-10-CM

## 2020-05-06 NOTE — Patient Instructions (Signed)
We'll get back to you about the Pristiq.  You had labs performed today.  You will be contacted with the results of the labs once they are available, usually in the next 3 business days for routine lab work.  If you have an active my chart account, they will be released to your MyChart.  If you prefer to have these labs released to you via telephone, please let us know.  If you had a pap smear or biopsy performed, expect to be contacted in about 7-10 days.

## 2020-05-06 NOTE — Progress Notes (Signed)
Subjective: CC: desires pregnancy PCP: Raliegh Ip, DO AYT:KZSWFU Foye Spurling is a 19 y.o. female presenting to clinic today for:  1. Desires pregnancy Patient comes to the office today because she desires pregnancy.  She has been seeing her current boyfriend for over 6 years now.  She is been highly considering pregnancy over the last 6 months and wanted to discuss this further with me today.  Of note, she has uncontrolled type 1 diabetes and is followed by endocrinology for this.  She notes her last A1c was over 9.  However, her blood sugars look under much better control as of late.  She was supposed to have a menstrual cycle about a week ago but is now 2 weeks late.  She is taken several home pregnancy test with these of all been negative.  Does not report any breast tenderness, nausea.  She is no longer on her OCP.  She desires referral to OB/GYN.  She is only been trying to get pregnant for the last couple of weeks.  No known family history of infertility or congenital abnormalities.  Denies any drug use, alcohol use or cigarette use  2.  Anxiety/depression Patient reports quite a bit of improvement in her symptoms with the Pristiq 25 mg daily.  She continues to take BuSpar twice daily and hydroxyzine as needed.  She has had some "brain zaps" along the posterior scalp but overall feels that her mood has been better on the Pristiq.  She does report some increased stressors, particularly surrounding the relationship with her sister.  She feels ostracized from her sister and one of her high school friends.  ROS: Per HPI  No Known Allergies Past Medical History:  Diagnosis Date  . Allergy   . Anxiety   . Asthma   . Carpal tunnel syndrome   . Depression   . Diabetes mellitus without complication (HCC) 2014   Type 1    Current Outpatient Medications:  .  busPIRone (BUSPAR) 15 MG tablet, Take 1 tablet (15 mg total) by mouth 2 (two) times daily., Disp: 60 tablet, Rfl: 0 .   Desvenlafaxine Succinate ER (PRISTIQ) 25 MG TB24, Take 25 mg by mouth daily. STOP Celexa., Disp: 30 tablet, Rfl: 0 .  glucagon (GLUCAGON EMERGENCY) 1 MG injection, Inject 1 mg into the muscle once as needed (for blood sugar). , Disp: , Rfl:  .  hydrOXYzine (ATARAX/VISTARIL) 25 MG tablet, Take 0.5-1 tablets (12.5-25 mg total) by mouth every 8 (eight) hours as needed for anxiety., Disp: 30 tablet, Rfl: 6 .  insulin degludec (TRESIBA FLEXTOUCH) 100 UNIT/ML FlexTouch Pen, Inject 60 units daily as directed, Disp: , Rfl:  .  levonorgestrel-ethinyl estradiol (ALTAVERA) 0.15-30 MG-MCG tablet, Take 1 tablet by mouth daily. (Needs to be seen before next refill), Disp: 28 tablet, Rfl: 0 .  metFORMIN (GLUCOPHAGE) 500 MG tablet, Take 500 mg by mouth 2 (two) times daily with a meal., Disp: , Rfl:  .  NOVOLOG FLEXPEN 100 UNIT/ML FlexPen, SMARTSIG:0-90 Unit(s) SUB-Q Daily, Disp: , Rfl:  .  omeprazole (PRILOSEC) 20 MG capsule, Take 1 capsule (20 mg total) by mouth daily., Disp: 7 capsule, Rfl: 0 .  ondansetron (ZOFRAN ODT) 4 MG disintegrating tablet, Take 1 tablet (4 mg total) by mouth every 8 (eight) hours as needed for nausea or vomiting., Disp: 20 tablet, Rfl: 0 .  polyethylene glycol powder (GLYCOLAX/MIRALAX) 17 GM/SCOOP powder, Take 17 g by mouth 2 (two) times daily as needed., Disp: 3350 g, Rfl: 3 Social  History   Socioeconomic History  . Marital status: Single    Spouse name: Not on file  . Number of children: Not on file  . Years of education: Not on file  . Highest education level: Not on file  Occupational History  . Not on file  Tobacco Use  . Smoking status: Passive Smoke Exposure - Never Smoker  . Smokeless tobacco: Never Used  Vaping Use  . Vaping Use: Never used  Substance and Sexual Activity  . Alcohol use: No  . Drug use: No  . Sexual activity: Yes    Birth control/protection: Condom, Pill  Other Topics Concern  . Not on file  Social History Narrative  . Not on file   Social  Determinants of Health   Financial Resource Strain:   . Difficulty of Paying Living Expenses: Not on file  Food Insecurity:   . Worried About Programme researcher, broadcasting/film/video in the Last Year: Not on file  . Ran Out of Food in the Last Year: Not on file  Transportation Needs:   . Lack of Transportation (Medical): Not on file  . Lack of Transportation (Non-Medical): Not on file  Physical Activity:   . Days of Exercise per Week: Not on file  . Minutes of Exercise per Session: Not on file  Stress:   . Feeling of Stress : Not on file  Social Connections:   . Frequency of Communication with Friends and Family: Not on file  . Frequency of Social Gatherings with Friends and Family: Not on file  . Attends Religious Services: Not on file  . Active Member of Clubs or Organizations: Not on file  . Attends Banker Meetings: Not on file  . Marital Status: Not on file  Intimate Partner Violence:   . Fear of Current or Ex-Partner: Not on file  . Emotionally Abused: Not on file  . Physically Abused: Not on file  . Sexually Abused: Not on file   Family History  Problem Relation Age of Onset  . Hypertension Mother   . Asthma Sister   . Asthma Brother     Objective: Office vital signs reviewed. BP 116/80   Pulse (!) 124   Temp 98.1 F (36.7 C)   Ht 5\' 2"  (1.575 m)   Wt 198 lb 3.2 oz (89.9 kg)   SpO2 97%   BMI 36.25 kg/m   Physical Examination:  General: Awake, alert, No acute distress HEENT: Normal, sclera white, MMM Cardio: regular rate  Pulm: Normal work of breathing on room air Psych: Mood stable, speech normal, affect appropriate, good eye contact.  Depression screen [facility] 2/9 05/06/2020 03/05/2020 01/17/2019  Decreased Interest 1 3 1   Down, Depressed, Hopeless 1 3 1   PHQ - 2 Score 2 6 2   Altered sleeping 3 3 2   Tired, decreased energy 2 1 0  Change in appetite 1 2 0  Feeling bad or failure about yourself  1 3 1   Trouble concentrating 1 3 0  Moving slowly or fidgety/restless  0 1 0  Suicidal thoughts 0 2 0  PHQ-9 Score 10 21 5   Difficult doing work/chores Not difficult at all Very difficult -  Some recent data might be hidden   GAD 7 : Generalized Anxiety Score 05/06/2020 03/05/2020 01/17/2019 12/19/2018  Nervous, Anxious, on Edge 2 3 1 1   Control/stop worrying 1 3 1 1   Worry too much - different things 1 3 1 1   Trouble relaxing 1 3 0 1  Restless  2 3 0 0  Easily annoyed or irritable 0 3 0 0  Afraid - awful might happen 0 3 2 1   Total GAD 7 Score 7 21 5 5   Anxiety Difficulty Very difficult Very difficult Not difficult at all Not difficult at all   Assessment/ Plan: 19 y.o. female   1. Desire for pregnancy Referral placed to OB/GYN as per her request.  We discussed that ideally I would recommend that she be in a better state of health before proceeding with any family planning.  I do worry about uncontrolled blood sugars and the complications of this can lead to during pregnancy.  We discussed the potential for abnormal fetal development including miscarriage with uncontrolled blood sugars.  She voiced good understanding. - Ambulatory referral to Obstetrics / Gynecology  2. Missed period We will obtain serum beta hCG given missed menstrual cycle.  Check TSH as well - Beta hCG quant (ref lab) - TSH  3. Generalized anxiety disorder Under much better control with the BuSpar and Pristiq.  We discussed potentially advancing dose to 50 mg but I will reach out to pharmacy to see if these medications are okay to continue in pregnancy.  May need to consider alternatives.  Patient voiced good understanding of this  4. Depressive disorder  5. Need for immunization against influenza Administered - Flu Vaccine QUAD 36+ mos IM   No orders of the defined types were placed in this encounter.  No orders of the defined types were placed in this encounter.    , DO [facility] [phone]

## 2020-05-07 LAB — BETA HCG QUANT (REF LAB): hCG Quant: <1 m[IU]/mL

## 2020-05-07 LAB — TSH: TSH: 4.18 u[IU]/mL (ref 0.450–4.500)

## 2020-05-22 ENCOUNTER — Other Ambulatory Visit: Payer: Self-pay | Admitting: Family Medicine

## 2020-05-22 DIAGNOSIS — F32A Depression, unspecified: Secondary | ICD-10-CM

## 2020-05-22 DIAGNOSIS — F329 Major depressive disorder, single episode, unspecified: Secondary | ICD-10-CM

## 2020-05-22 DIAGNOSIS — F410 Panic disorder [episodic paroxysmal anxiety] without agoraphobia: Secondary | ICD-10-CM

## 2020-05-27 ENCOUNTER — Encounter: Payer: Self-pay | Admitting: Nurse Practitioner

## 2020-05-27 ENCOUNTER — Other Ambulatory Visit: Payer: Self-pay

## 2020-05-27 ENCOUNTER — Encounter: Payer: Self-pay | Admitting: Women's Health

## 2020-05-27 ENCOUNTER — Ambulatory Visit (INDEPENDENT_AMBULATORY_CARE_PROVIDER_SITE_OTHER): Payer: Medicaid Other | Admitting: Women's Health

## 2020-05-27 ENCOUNTER — Ambulatory Visit (INDEPENDENT_AMBULATORY_CARE_PROVIDER_SITE_OTHER): Payer: Medicaid Other | Admitting: Nurse Practitioner

## 2020-05-27 VITALS — BP 125/90 | HR 91 | Ht 62.0 in | Wt 201.0 lb

## 2020-05-27 DIAGNOSIS — Z319 Encounter for procreative management, unspecified: Secondary | ICD-10-CM

## 2020-05-27 DIAGNOSIS — J00 Acute nasopharyngitis [common cold]: Secondary | ICD-10-CM | POA: Diagnosis not present

## 2020-05-27 DIAGNOSIS — E109 Type 1 diabetes mellitus without complications: Secondary | ICD-10-CM

## 2020-05-27 DIAGNOSIS — N926 Irregular menstruation, unspecified: Secondary | ICD-10-CM

## 2020-05-27 LAB — POCT URINE PREGNANCY: Preg Test, Ur: NEGATIVE

## 2020-05-27 NOTE — Progress Notes (Signed)
   GYN VISIT Patient name: Linda [name] MRN [id number]  Date of birth: 2001-01-29 Chief Complaint:   Possible Pregnancy (Pt wanting to get pregnant)  History of Present Illness:   Linda [name] is a 19 y.o. G0P0000 Caucasian female being seen today for wanting to get pregnant. LMP 9/20. Has been on COCs since middle school- just came off in Oct. Periods were regular on pills. No period since. T1DM dx @ 19yo managed by endocrinologist, last A1C 9 in Oct (highest 10.1), just got Dexcom, feels like sugars are getting better. Goes back in Jan.  Taking pnv.  Depression screen [facility] 2/9 05/27/2020 05/06/2020 03/05/2020 01/17/2019 01/02/2019  Decreased Interest 1 1 3 1 1   Down, Depressed, Hopeless 1 1 3 1 1   PHQ - 2 Score 2 2 6 2 2   Altered sleeping 1 3 3 2 2   Tired, decreased energy 2 2 1  0 1  Change in appetite 1 1 2  0 1  Feeling bad or failure about yourself  1 1 3 1  0  Trouble concentrating 2 1 3  0 0  Moving slowly or fidgety/restless 0 0 1 0 1  Suicidal thoughts 0 0 2 0 0  PHQ-9 Score 9 10 21 5 7   Difficult doing work/chores - Not difficult at all Very difficult - Somewhat difficult  Some recent data might be hidden    Patient's last menstrual period was 03/24/2020 (approximate). The current method of family planning is none.  Last pap <21yo. Results were:  n/a Review of Systems:   Pertinent items are noted in HPI Denies fever/chills, dizziness, headaches, visual disturbances, fatigue, shortness of breath, chest pain, abdominal pain, vomiting, abnormal vaginal discharge/itching/odor/irritation, problems with periods, bowel movements, urination, or intercourse unless otherwise stated above.  Pertinent History Reviewed:  Reviewed past medical,surgical, social, obstetrical and family history.  Reviewed problem list, medications and allergies. Physical Assessment:   Vitals:   05/27/20 1110  BP: 125/90  Pulse: 91  Weight: 201 lb (91.2 kg)  Height: 5\' 2"  (1.575 m)  Body mass  index is 36.76 kg/m.       Physical Examination:   General appearance: alert, well appearing, and in no distress  Mental status: alert, oriented to person, place, and time  Skin: warm & dry   Cardiovascular: normal heart rate noted  Respiratory: normal respiratory effort, no distress  Abdomen: soft, non-tender   Pelvic: examination not indicated  Extremities: no edema   Chaperone: N/A    Results for orders placed or performed in visit on 05/27/20 (from the past 24 hour(s))  POCT urine pregnancy   Collection Time: 05/27/20 11:17 AM  Result Value Ref Range   Preg Test, Ur Negative Negative    Assessment & Plan:  1) Uncontrolled T1DM desiring pregnancy> discussed optimal to wait to conceive until A1C is <6. Increased r/f SAB, stillbirth, heart defects, pregnancy complications if not. Condoms for now. See endocrinologist in Jan as scheduled. Continue pnv.   Meds: No orders of the defined types were placed in this encounter.   Orders Placed This Encounter  Procedures  . POCT urine pregnancy    Return for prn.  CNM, [facility] 05/27/2020 1:22 PM

## 2020-05-27 NOTE — Progress Notes (Signed)
Virtual Visit via telephone Note Due to COVID-19 pandemic this visit was conducted virtually. This visit type was conducted due to national recommendations for restrictions regarding the COVID-19 Pandemic (e.g. social distancing, sheltering in place) in an effort to limit this patient's exposure and mitigate transmission in our community. All issues noted in this document were discussed and addressed.  A physical exam was not performed with this format.  I connected with Linda [name] on 05/27/20 at 8:20 by telephone and verified that I am speaking with the correct person using two identifiers. Linda [name] is currently located at home and no one is currently with her during visit. The provider, Mary-Margaret Daphine Deutscher, FNP is located in their office at time of visit.  I discussed the limitations, risks, security and privacy concerns of performing an evaluation and management service by telephone and the availability of in person appointments. I also discussed with the patient that there may be a patient responsible charge related to this service. The patient expressed understanding and agreed to proceed.   History and Present Illness:   Chief Complaint: URI   HPI Patient calls in for telephone visit c/o sinus congestion with painful sinuses. Started about 1 week ago. Is some better. No fever, no cough, no lowss of taste or smell. She is taking nothing.    Review of Systems  Constitutional: Negative for diaphoresis and weight loss.  HENT: Positive for congestion. Negative for sinus pain (improved).   Eyes: Negative for blurred vision, double vision and pain.  Respiratory: Negative for cough and shortness of breath.   Cardiovascular: Negative for chest pain, palpitations, orthopnea and leg swelling.  Gastrointestinal: Negative for abdominal pain.  Skin: Negative for rash.  Neurological: Positive for headaches. Negative for dizziness, sensory change, loss of consciousness  and weakness.  Endo/Heme/Allergies: Negative for polydipsia. Does not bruise/bleed easily.  Psychiatric/Behavioral: Negative for memory loss. The patient does not have insomnia.   All other systems reviewed and are negative.    Observations/Objective: Alert and oriented- answers all questions appropriately No distress Voice hoarse  Assessment and Plan: Linda [name] in today with chief complaint of URI   1. Acute nasopharyngitis 1. Take meds as prescribed 2. Use a cool mist humidifier especially during the winter months and when heat has been humid. 3. Use saline nose sprays frequently 4. Saline irrigations of the nose can be very helpful if done frequently.  * 4X daily for 1 week*  * Use of a nettie pot can be helpful with this. Follow directions with this* 5. Drink plenty of fluids 6. Keep thermostat turn down low 7.For any cough or congestion  Use plain Mucinex- regular strength or max strength is fine   * Children- consult with Pharmacist for dosing 8. For fever or aces or pains- take tylenol or ibuprofen appropriate for age and weight.  * for fevers greater than 101 orally you may alternate ibuprofen and tylenol every  3 hours.       Follow Up Instructions: prn    I discussed the assessment and treatment plan with the patient. The patient was provided an opportunity to ask questions and all were answered. The patient agreed with the plan and demonstrated an understanding of the instructions.   The patient was advised to call back or seek an in-person evaluation if the symptoms worsen or if the condition fails to improve as anticipated.  The above assessment and management plan was discussed with the patient. The patient verbalized understanding  of and has agreed to the management plan. Patient is aware to call the clinic if symptoms persist or worsen. Patient is aware when to return to the clinic for a follow-up visit. Patient educated on when it is appropriate  to go to the emergency department.   Time call ended:  8:32  I provided 12 minutes of non-face-to-face time during this encounter.    Mary-Margaret Daphine Deutscher, FNP

## 2020-06-10 DIAGNOSIS — Z20822 Contact with and (suspected) exposure to covid-19: Secondary | ICD-10-CM | POA: Diagnosis not present

## 2020-06-12 ENCOUNTER — Telehealth: Payer: Medicaid Other | Admitting: Pharmacist

## 2020-06-12 ENCOUNTER — Telehealth: Payer: Self-pay | Admitting: Pharmacist

## 2020-06-12 NOTE — Telephone Encounter (Signed)
Discussed benefits vs risks of being on antidepressants while pregnant/breast feeding.  Patient states she is not currently trying to become pregnant until her A1c is controlled.  Per notes and patient report, she has discussed this with her OBGYN.  She would like for her A1c to be around 6% in order to reduce complications  Requested patient discuss antidepressant therapy with OBGYN as well and if it is okay for her to continue on Pristiq.  Patient would like stay on Pristiq since she is doing so well on this medication.  Pristiq is not well studied in pregnancy.    "The manufacturer warns that Pristiq should not be used during pregnancy unless the potential benefits outweigh any potential risks. In the first trimester of pregnancy, Pristiq could potentially increase the risk of birth defects by adversely influencing the fetal serotonin system"  "Some research shows that Effexor is safe to take during the first trimester. But, a study published in JAMA Psychiatry links taking Effexor during early pregnancy with more birth defects than other antidepressants, including defects of the: Heart. Brain."  Highly encouraged patient to discuss with OB.  This may be of concern as Pristiq is structurally similar to Effexor  We tend to lean towards using sertraline in pregnancy/breast feeding.  Potential complications include maternal weight changes and premature birth. Most studies show that SSRIs aren't associated with birth defects.  Encouraged patient to let PCP/OB know when she becomes pregnant or starts to try to become pregnant.  We can arrange alternative therapy as warranted.

## 2020-06-20 ENCOUNTER — Other Ambulatory Visit: Payer: Self-pay | Admitting: Family Medicine

## 2020-07-05 NOTE — L&D Delivery Note (Signed)
OB/GYN Faculty Practice Delivery Note  Linda [name] is a 20 y.o. G1P1001 s/p SVD at [redacted]w[redacted]d. She was admitted for IOL due to gHTN.   ROM: 34h 48m with clear fluid GBS Status: Negative  Delivery Date/Time: 05/25/21 at 1458  Delivery: Called to room and patient was complete and pushing. Head delivered LOA. No nuchal cord present. Shoulder and body delivered in usual fashion. Infant with spontaneous cry, placed on mother's abdomen, dried and stimulated. Cord clamped x 2 after 1-minute delay and cut by support person under my direct supervision. Cord blood drawn. Placenta delivered spontaneously with gentle cord traction. TXA given in addition to Cytotec 1000 mcg per rectum due brisk bleeding after delivery of placenta. Improved with treatment and fundal massage. Fundus firm and below umbilicus. Labia, perineum, vagina, and cervix were inspected, and a left periurethral and 1st degree perineal laceration were noted. The 1st degree perineal laceration was repaired with 3-0 Vicryl. The left periurethral laceration was hemostatic and not repaired.   Placenta: Intact; 3VC - sent to L&D  Complications: None  Lacerations: Left periurethral, 1st degree perineal  EBL: 400 cc Analgesia: Epidural, local 1% lidocaine   Infant: Viable female  APGARs 8 and 9  3230 g  Linda Field, MD OB/GYN Fellow, Faculty Practice

## 2020-07-08 ENCOUNTER — Other Ambulatory Visit: Payer: Self-pay

## 2020-07-08 ENCOUNTER — Encounter: Payer: Self-pay | Admitting: Nurse Practitioner

## 2020-07-08 ENCOUNTER — Ambulatory Visit (INDEPENDENT_AMBULATORY_CARE_PROVIDER_SITE_OTHER): Payer: Medicaid Other | Admitting: Nurse Practitioner

## 2020-07-08 VITALS — BP 126/76 | HR 103 | Temp 97.8°F | Ht 62.0 in | Wt 199.6 lb

## 2020-07-08 DIAGNOSIS — L0291 Cutaneous abscess, unspecified: Secondary | ICD-10-CM | POA: Diagnosis not present

## 2020-07-08 MED ORDER — DOXYCYCLINE HYCLATE 100 MG PO TABS: 100.0000 mg | ORAL_TABLET | Freq: Two times a day (BID) | ORAL | 0 refills | Status: DC

## 2020-07-08 MED ORDER — CEFTRIAXONE SODIUM 1 G IJ SOLR
1.0000 g | Freq: Once | INTRAMUSCULAR | Status: AC
  Administered 2020-07-08: 1 g via INTRAMUSCULAR

## 2020-07-08 NOTE — Progress Notes (Signed)
Acute Office Visit  Subjective:    Patient ID: Linda [name], female    DOB: [birth year], 20 y.o.   MRN: [id number]  Chief Complaint  Patient presents with  . neck mass    HPI Patient is in today for Abscess: Patient presents for evaluation of a cutaneous abscess. Lesion is located in the left lateral jaw. Onset was a few days ago. Symptoms have gradually worsened. Abscess has associated symptoms of pain. Patient does not have previous history of cutaneous abscesses. Patient does have diabetes.   Past Medical History:  Diagnosis Date  . Allergy   . Anxiety   . Asthma   . Carpal tunnel syndrome   . Depression   . Diabetes mellitus without complication (HCC) 2014   Type 1    Past Surgical History:  Procedure Laterality Date  . ADENOIDECTOMY    . TONSILLECTOMY    . WISDOM TOOTH EXTRACTION      Family History  Problem Relation Age of Onset  . Hypertension Mother   . Asthma Sister   . Asthma Brother   . COPD Paternal Grandmother   . Hypertension Paternal Grandmother   . COPD Maternal Grandmother   . Lumbar disc disease Maternal Grandmother   . Dementia Maternal Grandmother   . Heart attack Maternal Grandfather   . Emphysema Maternal Grandfather   . Ankylosing spondylitis Maternal Grandfather   . Dementia Maternal Grandfather     Social History   Socioeconomic History  . Marital status: Media planner    Spouse name: Not on file  . Number of children: Not on file  . Years of education: Not on file  . Highest education level: Not on file  Occupational History  . Not on file  Tobacco Use  . Smoking status: Passive Smoke Exposure - Never Smoker  . Smokeless tobacco: Never Used  Vaping Use  . Vaping Use: Never used  Substance and Sexual Activity  . Alcohol use: No  . Drug use: No  . Sexual activity: Yes  Other Topics Concern  . Not on file  Social History Narrative  . Not on file   Social Determinants of Health   Financial Resource Strain:  Low Risk   . Difficulty of Paying Living Expenses: Not hard at all  Food Insecurity: No Food Insecurity  . Worried About Programme researcher, broadcasting/film/video in the Last Year: Never true  . Ran Out of Food in the Last Year: Never true  Transportation Needs: No Transportation Needs  . Lack of Transportation (Medical): No  . Lack of Transportation (Non-Medical): No  Physical Activity: Inactive  . Days of Exercise per Week: 0 days  . Minutes of Exercise per Session: 0 min  Stress: No Stress Concern Present  . Feeling of Stress : Only a little  Social Connections: Moderately Isolated  . Frequency of Communication with Friends and Family: More than three times a week  . Frequency of Social Gatherings with Friends and Family: More than three times a week  . Attends Religious Services: Never  . Active Member of Clubs or Organizations: No  . Attends Banker Meetings: Never  . Marital Status: Living with partner  Intimate Partner Violence: Not At Risk  . Fear of Current or Ex-Partner: No  . Emotionally Abused: No  . Physically Abused: No  . Sexually Abused: No    Outpatient Medications Prior to Visit  Medication Sig Dispense Refill  . busPIRone (BUSPAR) 15 MG tablet TAKE 1 TABLET (  15 MG TOTAL) BY MOUTH 2 (TWO) TIMES DAILY. 60 tablet 2  . Desvenlafaxine Succinate ER 25 MG TB24 TAKE 1 TABLET BY MOUTH EVERY DAY. STOP CELEXA. 30 tablet 2  . glucagon 1 MG injection Inject 1 mg into the muscle once as needed (for blood sugar).     . hydrOXYzine (ATARAX/VISTARIL) 25 MG tablet Take 0.5-1 tablets (12.5-25 mg total) by mouth every 8 (eight) hours as needed for anxiety. 30 tablet 6  . insulin degludec (TRESIBA FLEXTOUCH) 100 UNIT/ML FlexTouch Pen Inject 60 units daily as directed    . loratadine (CLARITIN) 10 MG tablet TAKE 1 TABLET BY MOUTH EVERY DAY 30 tablet 11  . metFORMIN (GLUCOPHAGE) 500 MG tablet Take 500 mg by mouth 2 (two) times daily with a meal.    . NOVOLOG FLEXPEN 100 UNIT/ML FlexPen  SMARTSIG:0-90 Unit(s) SUB-Q Daily    . omeprazole (PRILOSEC) 20 MG capsule Take 1 capsule (20 mg total) by mouth daily. 7 capsule 0  . ondansetron (ZOFRAN ODT) 4 MG disintegrating tablet Take 1 tablet (4 mg total) by mouth every 8 (eight) hours as needed for nausea or vomiting. 20 tablet 0  . polyethylene glycol powder (GLYCOLAX/MIRALAX) 17 GM/SCOOP powder Take 17 g by mouth 2 (two) times daily as needed. 3350 g 3  . Prenatal Vit-Fe Fumarate-FA (PRENATAL VITAMIN PO) Take 1 tablet by mouth daily.     No facility-administered medications prior to visit.    No Known Allergies  Review of Systems  Constitutional: Negative for fatigue and fever.  HENT: Negative for ear discharge and ear pain.   Respiratory: Negative for shortness of breath.   Cardiovascular: Negative for chest pain.  Gastrointestinal: Negative for nausea and vomiting.  Skin:       Left lateral jaw abscess  Neurological: Negative.   Psychiatric/Behavioral: Negative.   All other systems reviewed and are negative.      Objective:    Physical Exam Vitals reviewed.  Constitutional:      General: She is awake.     Appearance: Normal appearance. She is well-groomed and overweight.     Interventions: Face mask in place.  HENT:     Head: Normocephalic.     Mouth/Throat:     Mouth: Mucous membranes are moist.     Pharynx: Oropharynx is clear.  Eyes:     Conjunctiva/sclera: Conjunctivae normal.  Cardiovascular:     Rate and Rhythm: Normal rate and regular rhythm.     Pulses: Normal pulses.     Heart sounds: Normal heart sounds.  Pulmonary:     Effort: Pulmonary effort is normal.     Breath sounds: Normal breath sounds.  Abdominal:     General: Bowel sounds are normal.  Musculoskeletal:     Cervical back: Normal range of motion.  Skin:    General: Skin is warm.     Findings: Erythema present.     Comments: Left lateral jaw abscess  Neurological:     Mental Status: She is alert and oriented to person, place, and  time.  Psychiatric:        Mood and Affect: Mood normal.        Behavior: Behavior is cooperative.     BP 126/76   Pulse (!) 103   Temp 97.8 F (36.6 C)   Ht 5\' 2"  (1.575 m)   Wt 199 lb 9.6 oz (90.5 kg)   SpO2 98%   BMI 36.51 kg/m  Wt Readings from Last 3 Encounters:  07/08/20 199  lb 9.6 oz (90.5 kg)  05/27/20 201 lb (91.2 kg)  05/06/20 198 lb 3.2 oz (89.9 kg)    Health Maintenance Due  Topic Date Due  . FOOT EXAM  Never done  . OPHTHALMOLOGY EXAM  Never done  . URINE MICROALBUMIN  Never done  . HEMOGLOBIN A1C  01/25/2016    There are no preventive care reminders to display for this patient.   Lab Results  Component Value Date   TSH 4.180 05/06/2020   Lab Results  Component Value Date   WBC 8.1 12/05/2019   HGB 15.0 12/05/2019   HCT 45.7 12/05/2019   MCV 93.5 12/05/2019   PLT 264 12/05/2019   Lab Results  Component Value Date   NA 135 12/05/2019   K 3.8 12/05/2019   CO2 25 12/05/2019   GLUCOSE 283 (H) 12/05/2019   BUN 13 12/05/2019   CREATININE 0.70 12/05/2019   BILITOT 0.7 12/05/2019   ALKPHOS 78 12/05/2019   AST 12 (L) 12/05/2019   ALT 12 12/05/2019   PROT 7.4 12/05/2019   ALBUMIN 3.9 12/05/2019   CALCIUM 9.2 12/05/2019   ANIONGAP 11 12/05/2019   GFR 88.02 08/15/2018   No results found for: CHOL No results found for: HDL No results found for: LDLCALC No results found for: TRIG No results found for: CHOLHDL Lab Results  Component Value Date   HGBA1C 9 07/28/2015       Assessment & Plan:   Problem List Items Addressed This Visit      Other   Abscess - Primary    Left lateral jaw abscess not well controlled. Symptoms in the last few days with pain and erythema. This is new for patient. Patient has tried nothing for symptoms. Started patient on doxycycline 100 mg twice daily for 10 days. A shot of Rocephin 1 g given in office. Advised patient to apply warm compress intermittently. Provided education with printed handouts given. Follow-up  in 48 hours. Rx sent to pharmacy.        Relevant Medications   doxycycline (VIBRA-TABS) 100 MG tablet       Meds ordered this encounter  Medications  . doxycycline (VIBRA-TABS) 100 MG tablet    Sig: Take 1 tablet (100 mg total) by mouth 2 (two) times daily.    Dispense:  20 tablet    Refill:  0    Order Specific Question:   Supervising Provider    Answer:   Raliegh Ip [[id number]]     Daryll Drown, NP

## 2020-07-08 NOTE — Addendum Note (Signed)
Addended by: Adella Hare B on: 07/08/2020 03:18 PM   Modules accepted: Orders

## 2020-07-08 NOTE — Assessment & Plan Note (Signed)
Left lateral jaw abscess not well controlled. Symptoms in the last few days with pain and erythema. This is new for patient. Patient has tried nothing for symptoms. Started patient on doxycycline 100 mg twice daily for 10 days. A shot of Rocephin 1 g given in office. Advised patient to apply warm compress intermittently. Provided education with printed handouts given. Follow-up in 48 hours. Rx sent to pharmacy.

## 2020-07-08 NOTE — Patient Instructions (Signed)
  Rocephin 1 g in office, doxycycline started, warm compress follow-up with cold compress, follow-up in 48 hours.  Skin Abscess  A skin abscess is an infected area of your skin that contains pus and other material. An abscess can happen in any part of your body. Some abscesses break open (rupture) on their own. Most continue to get worse unless they are treated. The infection can spread deeper into the body and into your blood, which can make you feel sick. A skin abscess is caused by germs that enter the skin through a cut or scrape. It can also be caused by blocked oil and sweat glands or infected hair follicles. This condition is usually treated by:  Draining the pus.  Taking antibiotic medicines.  Placing a warm, wet washcloth over the abscess. Follow these instructions at home: Medicines   Take over-the-counter and prescription medicines only as told by your doctor.  If you were prescribed an antibiotic medicine, take it as told by your doctor. Do not stop taking the antibiotic even if you start to feel better. Abscess care   If you have an abscess that has not drained, place a warm, clean, wet washcloth over the abscess several times a day. Do this as told by your doctor.  Follow instructions from your doctor about how to take care of your abscess. Make sure you: ? Cover the abscess with a bandage (dressing). ? Change your bandage or gauze as told by your doctor. ? Wash your hands with soap and water before you change the bandage or gauze. If you cannot use soap and water, use hand sanitizer.  Check your abscess every day for signs that the infection is getting worse. Check for: ? More redness, swelling, or pain. ? More fluid or blood. ? Warmth. ? More pus or a bad smell. General instructions  To avoid spreading the infection: ? Do not share personal care items, towels, or hot tubs with others. ? Avoid making skin-to-skin contact with other people.  Keep all follow-up  visits as told by your doctor. This is important. Contact a doctor if:  You have more redness, swelling, or pain around your abscess.  You have more fluid or blood coming from your abscess.  Your abscess feels warm when you touch it.  You have more pus or a bad smell coming from your abscess.  You have a fever.  Your muscles ache.  You have chills.  You feel sick. Get help right away if:  You have very bad (severe) pain.  You see red streaks on your skin spreading away from the abscess. Summary  A skin abscess is an infected area of your skin that contains pus and other material.  The abscess is caused by germs that enter the skin through a cut or scrape. It can also be caused by blocked oil and sweat glands or infected hair follicles.  Follow your doctor's instructions on caring for your abscess, taking medicines, preventing infections, and keeping follow-up visits. This information is not intended to replace advice given to you by your health care provider. Make sure you discuss any questions you have with your health care provider. Document Revised: 01/25/2019 Document Reviewed: 08/04/2017 Elsevier Patient Education  2020 [employer].

## 2020-07-10 ENCOUNTER — Other Ambulatory Visit: Payer: Self-pay

## 2020-07-10 ENCOUNTER — Ambulatory Visit (INDEPENDENT_AMBULATORY_CARE_PROVIDER_SITE_OTHER): Payer: Medicaid Other | Admitting: Family Medicine

## 2020-07-10 VITALS — BP 127/84 | HR 107 | Temp 98.5°F | Ht 62.0 in | Wt 203.0 lb

## 2020-07-10 DIAGNOSIS — L0291 Cutaneous abscess, unspecified: Secondary | ICD-10-CM | POA: Diagnosis not present

## 2020-07-10 MED ORDER — NAPROXEN 500 MG PO TBEC: 500.0000 mg | DELAYED_RELEASE_TABLET | Freq: Two times a day (BID) | ORAL | 0 refills | Status: DC

## 2020-07-10 NOTE — Progress Notes (Signed)
Subjective: CC: recheck abscess PCP: Raliegh Ip, DO MHD:[id number] Linda [name] is a 20 y.o. female presenting to clinic today for:  1. Abscess Patient was seen 2 days ago for a cyst which was infected along the inferior aspect of the jaw on the left.  She was given Rocephin and started on doxycycline twice daily.  It has been relatively unchanged.  It continues to be quite tender.  She denies any nuchal rigidity, fevers, difficulty swallowing or breathing.  She has been using warm compresses in hopes that it will drain.   ROS: Per HPI  No Known Allergies Past Medical History:  Diagnosis Date  . Allergy   . Anxiety   . Asthma   . Carpal tunnel syndrome   . Depression   . Diabetes mellitus without complication (HCC) 2014   Type 1    Current Outpatient Medications:  .  busPIRone (BUSPAR) 15 MG tablet, TAKE 1 TABLET (15 MG TOTAL) BY MOUTH 2 (TWO) TIMES DAILY., Disp: 60 tablet, Rfl: 2 .  Desvenlafaxine Succinate ER 25 MG TB24, TAKE 1 TABLET BY MOUTH EVERY DAY. STOP CELEXA., Disp: 30 tablet, Rfl: 2 .  doxycycline (VIBRA-TABS) 100 MG tablet, Take 1 tablet (100 mg total) by mouth 2 (two) times daily., Disp: 20 tablet, Rfl: 0 .  glucagon 1 MG injection, Inject 1 mg into the muscle once as needed (for blood sugar). , Disp: , Rfl:  .  hydrOXYzine (ATARAX/VISTARIL) 25 MG tablet, Take 0.5-1 tablets (12.5-25 mg total) by mouth every 8 (eight) hours as needed for anxiety., Disp: 30 tablet, Rfl: 6 .  insulin degludec (TRESIBA FLEXTOUCH) 100 UNIT/ML FlexTouch Pen, Inject 60 units daily as directed, Disp: , Rfl:  .  loratadine (CLARITIN) 10 MG tablet, TAKE 1 TABLET BY MOUTH EVERY DAY, Disp: 30 tablet, Rfl: 11 .  metFORMIN (GLUCOPHAGE) 500 MG tablet, Take 500 mg by mouth 2 (two) times daily with a meal., Disp: , Rfl:  .  NOVOLOG FLEXPEN 100 UNIT/ML FlexPen, SMARTSIG:0-90 Unit(s) SUB-Q Daily, Disp: , Rfl:  .  omeprazole (PRILOSEC) 20 MG capsule, Take 1 capsule (20 mg total) by mouth daily.,  Disp: 7 capsule, Rfl: 0 .  ondansetron (ZOFRAN ODT) 4 MG disintegrating tablet, Take 1 tablet (4 mg total) by mouth every 8 (eight) hours as needed for nausea or vomiting., Disp: 20 tablet, Rfl: 0 .  polyethylene glycol powder (GLYCOLAX/MIRALAX) 17 GM/SCOOP powder, Take 17 g by mouth 2 (two) times daily as needed., Disp: 3350 g, Rfl: 3 .  Prenatal Vit-Fe Fumarate-FA (PRENATAL VITAMIN PO), Take 1 tablet by mouth daily., Disp: , Rfl:  Social History   Socioeconomic History  . Marital status: Media planner    Spouse name: Not on file  . Number of children: Not on file  . Years of education: Not on file  . Highest education level: Not on file  Occupational History  . Not on file  Tobacco Use  . Smoking status: Passive Smoke Exposure - Never Smoker  . Smokeless tobacco: Never Used  Vaping Use  . Vaping Use: Never used  Substance and Sexual Activity  . Alcohol use: No  . Drug use: No  . Sexual activity: Yes  Other Topics Concern  . Not on file  Social History Narrative  . Not on file   Social Determinants of Health   Financial Resource Strain: Low Risk   . Difficulty of Paying Living Expenses: Not hard at all  Food Insecurity: No Food Insecurity  . Worried About  Running Out of Food in the Last Year: Never true  . Ran Out of Food in the Last Year: Never true  Transportation Needs: No Transportation Needs  . Lack of Transportation (Medical): No  . Lack of Transportation (Non-Medical): No  Physical Activity: Inactive  . Days of Exercise per Week: 0 days  . Minutes of Exercise per Session: 0 min  Stress: No Stress Concern Present  . Feeling of Stress : Only a little  Social Connections: Moderately Isolated  . Frequency of Communication with Friends and Family: More than three times a week  . Frequency of Social Gatherings with Friends and Family: More than three times a week  . Attends Religious Services: Never  . Active Member of Clubs or Organizations: No  . Attends Tax inspector Meetings: Never  . Marital Status: Living with partner  Intimate Partner Violence: Not At Risk  . Fear of Current or Ex-Partner: No  . Emotionally Abused: No  . Physically Abused: No  . Sexually Abused: No   Family History  Problem Relation Age of Onset  . Hypertension Mother   . Asthma Sister   . Asthma Brother   . COPD Paternal Grandmother   . Hypertension Paternal Grandmother   . COPD Maternal Grandmother   . Lumbar disc disease Maternal Grandmother   . Dementia Maternal Grandmother   . Heart attack Maternal Grandfather   . Emphysema Maternal Grandfather   . Ankylosing spondylitis Maternal Grandfather   . Dementia Maternal Grandfather     Objective: Office vital signs reviewed. BP 127/84   Pulse (!) 107   Temp 98.5 F (36.9 C) (Temporal)   Ht 5\' 2"  (1.575 m)   Wt 203 lb (92.1 kg)   BMI 37.13 kg/m   Physical Examination:  General: Awake, alert, No acute distress Neck: No lymphadenopathy.  No nuchal rigidity. Pulmonary: Normal work of breathing on room air Skin: Kidney bean sized area of induration.  There is an appreciable punctum with associated mild erythema just along the apex of the curvature of her neck.  This area is tender.  There is no palpable fluctuance.  There is warmth.  Assessment/ Plan: 20 y.o. female   Abscess - Plan: naproxen (EC NAPROSYN) 500 MG EC tablet, Ambulatory referral to General Surgery  Abscess is complicated by type 1 diabetes.  There has been no further enlargement of the lesion.  There is still nothing that feels like it can be drained.  I am adding an oral antiinflammatory.  Discussed the risk with Pristiq.  Try to take separately.  We discussed red flag signs and symptoms warranting further evaluation emergency department.  She was good understanding  I'm placing a referral for general surgical evaluation for excision since this appears to be an infected cyst that has been present for a while  No orders of the defined  types were placed in this encounter.  No orders of the defined types were placed in this encounter.  26, DO [facility] [phone]

## 2020-07-14 ENCOUNTER — Other Ambulatory Visit: Payer: Self-pay | Admitting: Family Medicine

## 2020-07-14 DIAGNOSIS — L0291 Cutaneous abscess, unspecified: Secondary | ICD-10-CM

## 2020-07-30 DIAGNOSIS — R221 Localized swelling, mass and lump, neck: Secondary | ICD-10-CM | POA: Diagnosis not present

## 2020-08-04 DIAGNOSIS — Z7984 Long term (current) use of oral hypoglycemic drugs: Secondary | ICD-10-CM | POA: Diagnosis not present

## 2020-08-04 DIAGNOSIS — E109 Type 1 diabetes mellitus without complications: Secondary | ICD-10-CM | POA: Diagnosis not present

## 2020-08-04 DIAGNOSIS — Z794 Long term (current) use of insulin: Secondary | ICD-10-CM | POA: Diagnosis not present

## 2020-08-12 ENCOUNTER — Other Ambulatory Visit: Payer: Self-pay | Admitting: Family Medicine

## 2020-08-12 ENCOUNTER — Other Ambulatory Visit: Payer: Self-pay | Admitting: Otolaryngology

## 2020-08-12 DIAGNOSIS — L0291 Cutaneous abscess, unspecified: Secondary | ICD-10-CM

## 2020-08-12 DIAGNOSIS — R221 Localized swelling, mass and lump, neck: Secondary | ICD-10-CM

## 2020-08-22 ENCOUNTER — Other Ambulatory Visit: Payer: Self-pay

## 2020-08-22 ENCOUNTER — Ambulatory Visit
Admission: RE | Admit: 2020-08-22 | Discharge: 2020-08-22 | Disposition: A | Discharge status: Home / Self Care | Payer: Medicaid Other | Source: Ambulatory Visit | Attending: Otolaryngology | Admitting: Otolaryngology

## 2020-08-22 DIAGNOSIS — E041 Nontoxic single thyroid nodule: Secondary | ICD-10-CM | POA: Diagnosis not present

## 2020-08-22 DIAGNOSIS — R220 Localized swelling, mass and lump, head: Secondary | ICD-10-CM | POA: Diagnosis not present

## 2020-08-22 DIAGNOSIS — R221 Localized swelling, mass and lump, neck: Secondary | ICD-10-CM

## 2020-08-22 MED ORDER — IOPAMIDOL (ISOVUE-300) INJECTION 61%
75.0000 mL | Freq: Once | INTRAVENOUS | Status: AC | PRN
  Administered 2020-08-22: 75 mL via INTRAVENOUS

## 2020-08-27 ENCOUNTER — Other Ambulatory Visit: Payer: Self-pay | Admitting: Family Medicine

## 2020-08-27 DIAGNOSIS — F32A Depression, unspecified: Secondary | ICD-10-CM

## 2020-08-27 DIAGNOSIS — F410 Panic disorder [episodic paroxysmal anxiety] without agoraphobia: Secondary | ICD-10-CM

## 2020-08-27 DIAGNOSIS — F329 Major depressive disorder, single episode, unspecified: Secondary | ICD-10-CM

## 2020-08-29 ENCOUNTER — Other Ambulatory Visit: Payer: Self-pay | Admitting: *Deleted

## 2020-08-29 DIAGNOSIS — L0291 Cutaneous abscess, unspecified: Secondary | ICD-10-CM

## 2020-09-09 ENCOUNTER — Encounter: Payer: Self-pay | Admitting: Family Medicine

## 2020-09-21 DIAGNOSIS — H5213 Myopia, bilateral: Secondary | ICD-10-CM | POA: Diagnosis not present

## 2020-09-22 DIAGNOSIS — R221 Localized swelling, mass and lump, neck: Secondary | ICD-10-CM | POA: Diagnosis not present

## 2020-09-30 ENCOUNTER — Other Ambulatory Visit: Payer: Self-pay | Admitting: Family Medicine

## 2020-09-30 DIAGNOSIS — F329 Major depressive disorder, single episode, unspecified: Secondary | ICD-10-CM

## 2020-09-30 DIAGNOSIS — F32A Depression, unspecified: Secondary | ICD-10-CM

## 2020-09-30 DIAGNOSIS — F410 Panic disorder [episodic paroxysmal anxiety] without agoraphobia: Secondary | ICD-10-CM

## 2020-10-01 NOTE — Telephone Encounter (Signed)
[place]. NTBS 30 days given 08/27/20 

## 2020-10-03 ENCOUNTER — Other Ambulatory Visit: Payer: Self-pay | Admitting: Family Medicine

## 2020-10-03 ENCOUNTER — Other Ambulatory Visit: Payer: Medicaid Other

## 2020-10-03 DIAGNOSIS — N926 Irregular menstruation, unspecified: Secondary | ICD-10-CM

## 2020-10-11 DIAGNOSIS — R000 Tachycardia, unspecified: Secondary | ICD-10-CM | POA: Diagnosis not present

## 2020-10-11 DIAGNOSIS — E1165 Type 2 diabetes mellitus with hyperglycemia: Secondary | ICD-10-CM | POA: Diagnosis not present

## 2020-10-13 ENCOUNTER — Telehealth: Payer: Self-pay

## 2020-10-13 NOTE — Telephone Encounter (Signed)
Transition Care Management Follow-up Telephone Call  Date of discharge and from where: 10/11/2020 from [facility]  How have you been since you were released from the hospital? Pt stated that she is feeling better.   Any questions or concerns? No  Items Reviewed:  Did the pt receive and understand the discharge instructions provided? Yes   Medications obtained and verified? Yes   Other? No   Any new allergies since your discharge? No   Dietary orders reviewed? DM  Do you have support at home? Yes   Functional Questionnaire: (I = Independent and D = Dependent) ADLs: I  Bathing/Dressing- I  Meal Prep- I  Eating- I  Maintaining continence- I  Transferring/Ambulation- I  Managing Meds- I   Follow up appointments reviewed:   PCP Hospital f/u appt confirmed? No    Specialist Hospital f/u appt confirmed? Yes  Scheduled to see Cyril Mourning, NP on 10/15/2020 @ 4:00pm.  Are transportation arrangements needed? No   If their condition worsens, is the pt aware to call PCP or go to the Emergency Dept.? Yes  Was the patient provided with contact information for the PCP's office or ED? Yes  Was to pt encouraged to call back with questions or concerns? Yes

## 2020-10-15 ENCOUNTER — Encounter: Payer: Self-pay | Admitting: Adult Health

## 2020-10-15 ENCOUNTER — Other Ambulatory Visit: Payer: Self-pay

## 2020-10-15 ENCOUNTER — Ambulatory Visit (INDEPENDENT_AMBULATORY_CARE_PROVIDER_SITE_OTHER): Payer: Medicaid Other | Admitting: Adult Health

## 2020-10-15 VITALS — BP 121/80 | HR 105 | Ht 62.0 in | Wt 202.0 lb

## 2020-10-15 DIAGNOSIS — Z3201 Encounter for pregnancy test, result positive: Secondary | ICD-10-CM | POA: Diagnosis not present

## 2020-10-15 DIAGNOSIS — F32A Depression, unspecified: Secondary | ICD-10-CM

## 2020-10-15 DIAGNOSIS — F419 Anxiety disorder, unspecified: Secondary | ICD-10-CM | POA: Diagnosis not present

## 2020-10-15 DIAGNOSIS — O3680X0 Pregnancy with inconclusive fetal viability, not applicable or unspecified: Secondary | ICD-10-CM | POA: Insufficient documentation

## 2020-10-15 DIAGNOSIS — E109 Type 1 diabetes mellitus without complications: Secondary | ICD-10-CM | POA: Diagnosis not present

## 2020-10-15 DIAGNOSIS — Z3A01 Less than 8 weeks gestation of pregnancy: Secondary | ICD-10-CM | POA: Insufficient documentation

## 2020-10-15 LAB — POCT URINE PREGNANCY: Preg Test, Ur: POSITIVE — AB

## 2020-10-15 NOTE — Progress Notes (Signed)
  Subjective:     Patient ID: Linda [name], female   DOB: [birth year], 20 y.o.   MRN: [id number]  HPI Linda [name] is a 20 year old white female, with DP, in for UPT has missed a period and had 3+HPTs. She is on insulin and metformin and thinks A1c 8 or so.  PCP is Dr Nadine Counts.  Review of Systems Has missed period and had 3+HPTs Has breast tenderness Some hip and low back pain  Reviewed past medical,surgical, social and family history. Reviewed medications and allergies.     Objective:   Physical Exam BP 121/80 (BP Location: Left Arm, Patient Position: Sitting, Cuff Size: Large)   Pulse (!) 105   Ht 5\' 2"  (1.575 m)   Wt 202 lb (91.6 kg)   LMP 08/30/2020   BMI 36.95 kg/m UPT is +, about 6+4 weeks by LMP with EDD 06/06/21.Skin warm and dry. Neck: mid line trachea, normal thyroid, good ROM, no lymphadenopathy noted. Lungs: clear to ausculation bilaterally. Cardiovascular: regular rate and rhythm,abdomen is soft and non tender. AA is 0 Fall risk is low PHQ 9 score is 17 GAD 7 score is 21  Upstream - 10/15/20 1554      Pregnancy Intention Screening   Does the patient want to become pregnant in the next year? N/A    Does the patient's partner want to become pregnant in the next year? N/A    Would the patient like to discuss contraceptive options today? N/A      Contraception Wrap Up   Current Method Pregnant/Seeking Pregnancy    End Method Pregnant/Seeking Pregnancy    Contraception Counseling Provided No             Assessment:     1. Pregnancy examination or test, positive result   2. Less than [redacted] weeks gestation of pregnancy Take PNV  3. Encounter to determine fetal viability of pregnancy, single or unspecified fetus Return in about 4 weeks for dating 10/17/20  4. Type 1 diabetes mellitus without complication (HCC) Check blood sugar fasting and 2 hours after meals and record,has Dexcom Has appt 10/22/20 with new endocrinologist in [place]  5. Anxiety and  depression Ok to continue pristiq and buspar    Plan:     Review handout on First trimester and by [facility]

## 2020-10-15 NOTE — Patient Instructions (Signed)
Obstetrics: Normal and Problem Pregnancies (7th ed., pp. 102-121). [place], [place]: Elsevier."> Textbook of Family Medicine (9th ed., pp. 365-410). [place], [place]: Elsevier [employer].">  First Trimester of Pregnancy  The first trimester of pregnancy starts on the first day of your last menstrual period until the end of week 12. This is months 1 through 3 of pregnancy. A week after a sperm fertilizes an egg, the egg will implant into the wall of the uterus and begin to develop into a baby. By the end of 12 weeks, all the baby's organs will be formed and the baby will be 2-3 inches in size. Body changes during your first trimester Your body goes through many changes during pregnancy. The changes vary and generally return to normal after your baby is born. Physical changes  You may gain or lose weight.  Your breasts may begin to grow larger and become tender. The tissue that surrounds your nipples (areola) may become darker.  Dark spots or blotches (chloasma or mask of pregnancy) may develop on your face.  You may have changes in your hair. These can include thickening or thinning of your hair or changes in texture. Health changes  You may feel nauseous, and you may vomit.  You may have heartburn.  You may develop headaches.  You may develop constipation.  Your gums may bleed and may be sensitive to brushing and flossing. Other changes  You may tire easily.  You may urinate more often.  Your menstrual periods will stop.  You may have a loss of appetite.  You may develop cravings for certain kinds of food.  You may have changes in your emotions from day to day.  You may have more vivid and strange dreams. Follow these instructions at home: Medicines  Follow your health care provider's instructions regarding medicine use. Specific medicines may be either safe or unsafe to take during pregnancy. Do not take any medicines unless told to by your health care provider.  Take a  prenatal vitamin that contains at least 600 micrograms (mcg) of folic acid. Eating and drinking  Eat a healthy diet that includes fresh fruits and vegetables, whole grains, good sources of protein such as meat, eggs, or tofu, and low-fat dairy products.  Avoid raw meat and unpasteurized juice, milk, and cheese. These carry germs that can harm you and your baby.  If you feel nauseous or you vomit: ? Eat 4 or 5 small meals a day instead of 3 large meals. ? Try eating a few soda crackers. ? Drink liquids between meals instead of during meals.  You may need to take these actions to prevent or treat constipation: ? Drink enough fluid to keep your urine pale yellow. ? Eat foods that are high in fiber, such as beans, whole grains, and fresh fruits and vegetables. ? Limit foods that are high in fat and processed sugars, such as fried or sweet foods. Activity  Exercise only as directed by your health care provider. Most people can continue their usual exercise routine during pregnancy. Try to exercise for 30 minutes at least 5 days a week.  Stop exercising if you develop pain or cramping in the lower abdomen or lower back.  Avoid exercising if it is very hot or humid or if you are at high altitude.  Avoid heavy lifting.  If you choose to, you may have sex unless your health care provider tells you not to. Relieving pain and discomfort  Wear a good support bra to relieve breast   tenderness.  Rest with your legs elevated if you have leg cramps or low back pain.  If you develop bulging veins (varicose veins) in your legs: ? Wear support hose as told by your health care provider. ? Elevate your feet for 15 minutes, 3-4 times a day. ? Limit salt in your diet. Safety  Wear your seat belt at all times when driving or riding in a car.  Talk with your health care provider if someone is verbally or physically abusive to you.  Talk with your health care provider if you are feeling sad or have  thoughts of hurting yourself. Lifestyle  Do not use hot tubs, steam rooms, or saunas.  Do not douche. Do not use tampons or scented sanitary pads.  Do not use herbal remedies, alcohol, illegal drugs, or medicines that are not approved by your health care provider. Chemicals in these products can harm your baby.  Do not use any products that contain nicotine or tobacco, such as cigarettes, e-cigarettes, and chewing tobacco. If you need help quitting, ask your health care provider.  Avoid cat litter boxes and soil used by cats. These carry germs that can cause birth defects in the baby and possibly loss of the unborn baby (fetus) by miscarriage or stillbirth. General instructions  During routine prenatal visits in the first trimester, your health care provider will do a physical exam, perform necessary tests, and ask you how things are going. Keep all follow-up visits. This is important.  Ask for help if you have counseling or nutritional needs during pregnancy. Your health care provider can offer advice or refer you to specialists for help with various needs.  Schedule a dentist appointment. At home, brush your teeth with a soft toothbrush. Floss gently.  Write down your questions. Take them to your prenatal visits. Where to find more information  American Pregnancy Association: americanpregnancy.org  American College of Obstetricians and Gynecologists: acog.org/en/Womens%20Health/Pregnancy  Office on Women's Health: womenshealth.gov/pregnancy Contact a health care provider if you have:  Dizziness.  A fever.  Mild pelvic cramps, pelvic pressure, or nagging pain in the abdominal area.  Nausea, vomiting, or diarrhea that lasts for 24 hours or longer.  A bad-smelling vaginal discharge.  Pain when you urinate.  Known exposure to a contagious illness, such as chickenpox, measles, Zika virus, HIV, or hepatitis. Get help right away if you have:  Spotting or bleeding from your  vagina.  Severe abdominal cramping or pain.  Shortness of breath or chest pain.  Any kind of trauma, such as from a fall or a car crash.  New or increased pain, swelling, or redness in an arm or leg. Summary  The first trimester of pregnancy starts on the first day of your last menstrual period until the end of week 12 (months 1 through 3).  Eating 4 or 5 small meals a day rather than 3 large meals may help to relieve nausea and vomiting.  Do not use any products that contain nicotine or tobacco, such as cigarettes, e-cigarettes, and chewing tobacco. If you need help quitting, ask your health care provider.  Keep all follow-up visits. This is important. This information is not intended to replace advice given to you by your health care provider. Make sure you discuss any questions you have with your health care provider. Document Revised: 11/28/2019 Document Reviewed: 10/04/2019 Elsevier Patient Education  2021 Elsevier Inc.  

## 2020-10-22 ENCOUNTER — Other Ambulatory Visit: Payer: Self-pay | Admitting: Family Medicine

## 2020-10-22 ENCOUNTER — Telehealth: Payer: Self-pay

## 2020-10-22 MED ORDER — DESVENLAFAXINE SUCCINATE ER 25 MG PO TB24: 25.0000 mg | ORAL_TABLET | Freq: Every day | ORAL | 0 refills | Status: DC

## 2020-10-22 NOTE — Telephone Encounter (Signed)
Is it okay to refill until appointment?  

## 2020-10-22 NOTE — Telephone Encounter (Signed)
sent 

## 2020-10-23 NOTE — Telephone Encounter (Signed)
Patient aware.

## 2020-10-28 ENCOUNTER — Encounter: Payer: Self-pay | Admitting: *Deleted

## 2020-10-28 DIAGNOSIS — F17290 Nicotine dependence, other tobacco product, uncomplicated: Secondary | ICD-10-CM | POA: Diagnosis not present

## 2020-10-28 DIAGNOSIS — E1065 Type 1 diabetes mellitus with hyperglycemia: Secondary | ICD-10-CM | POA: Diagnosis not present

## 2020-10-28 DIAGNOSIS — Z3A00 Weeks of gestation of pregnancy not specified: Secondary | ICD-10-CM | POA: Diagnosis not present

## 2020-10-28 DIAGNOSIS — Z3A09 9 weeks gestation of pregnancy: Secondary | ICD-10-CM | POA: Diagnosis not present

## 2020-10-28 DIAGNOSIS — Z794 Long term (current) use of insulin: Secondary | ICD-10-CM | POA: Diagnosis not present

## 2020-10-28 DIAGNOSIS — O24011 Pre-existing diabetes mellitus, type 1, in pregnancy, first trimester: Secondary | ICD-10-CM | POA: Diagnosis not present

## 2020-11-03 ENCOUNTER — Other Ambulatory Visit: Payer: Self-pay | Admitting: Family Medicine

## 2020-11-03 DIAGNOSIS — F32A Depression, unspecified: Secondary | ICD-10-CM

## 2020-11-03 DIAGNOSIS — F410 Panic disorder [episodic paroxysmal anxiety] without agoraphobia: Secondary | ICD-10-CM

## 2020-11-03 DIAGNOSIS — F329 Major depressive disorder, single episode, unspecified: Secondary | ICD-10-CM

## 2020-11-12 DIAGNOSIS — O24019 Pre-existing diabetes mellitus, type 1, in pregnancy, unspecified trimester: Secondary | ICD-10-CM | POA: Insufficient documentation

## 2020-11-13 ENCOUNTER — Other Ambulatory Visit: Payer: Self-pay

## 2020-11-13 ENCOUNTER — Ambulatory Visit (INDEPENDENT_AMBULATORY_CARE_PROVIDER_SITE_OTHER): Payer: Medicaid Other

## 2020-11-13 DIAGNOSIS — O3680X0 Pregnancy with inconclusive fetal viability, not applicable or unspecified: Secondary | ICD-10-CM | POA: Diagnosis not present

## 2020-11-13 DIAGNOSIS — Z3A10 10 weeks gestation of pregnancy: Secondary | ICD-10-CM | POA: Diagnosis not present

## 2020-11-13 NOTE — Progress Notes (Signed)
[place] 10+5 wks,single IUP,positive fht,158 bpm,normal ovaries,crl 31.39 mm

## 2020-11-26 ENCOUNTER — Other Ambulatory Visit: Payer: Self-pay | Admitting: Family Medicine

## 2020-11-28 DIAGNOSIS — E109 Type 1 diabetes mellitus without complications: Secondary | ICD-10-CM | POA: Diagnosis not present

## 2020-11-28 DIAGNOSIS — O26812 Pregnancy related exhaustion and fatigue, second trimester: Secondary | ICD-10-CM | POA: Diagnosis not present

## 2020-11-28 DIAGNOSIS — O24011 Pre-existing diabetes mellitus, type 1, in pregnancy, first trimester: Secondary | ICD-10-CM | POA: Diagnosis not present

## 2020-11-28 DIAGNOSIS — O26811 Pregnancy related exhaustion and fatigue, first trimester: Secondary | ICD-10-CM | POA: Diagnosis not present

## 2020-11-28 DIAGNOSIS — Z3A13 13 weeks gestation of pregnancy: Secondary | ICD-10-CM | POA: Diagnosis not present

## 2020-11-28 DIAGNOSIS — O24012 Pre-existing diabetes mellitus, type 1, in pregnancy, second trimester: Secondary | ICD-10-CM | POA: Diagnosis not present

## 2020-12-02 DIAGNOSIS — E559 Vitamin D deficiency, unspecified: Secondary | ICD-10-CM | POA: Insufficient documentation

## 2020-12-03 ENCOUNTER — Other Ambulatory Visit: Payer: Self-pay | Admitting: Adult Health

## 2020-12-03 MED ORDER — PROMETHAZINE HCL 25 MG PO TABS: 25.0000 mg | ORAL_TABLET | Freq: Four times a day (QID) | ORAL | 1 refills | Status: DC | PRN

## 2020-12-03 NOTE — Progress Notes (Signed)
Will rx phenergan  

## 2020-12-04 DIAGNOSIS — O0990 Supervision of high risk pregnancy, unspecified, unspecified trimester: Secondary | ICD-10-CM | POA: Insufficient documentation

## 2020-12-10 ENCOUNTER — Ambulatory Visit: Payer: Medicaid Other | Admitting: *Deleted

## 2020-12-10 ENCOUNTER — Encounter: Payer: Self-pay | Admitting: Women's Health

## 2020-12-10 ENCOUNTER — Ambulatory Visit (INDEPENDENT_AMBULATORY_CARE_PROVIDER_SITE_OTHER): Payer: Medicaid Other | Admitting: Women's Health

## 2020-12-10 ENCOUNTER — Other Ambulatory Visit: Payer: Self-pay

## 2020-12-10 VITALS — BP 124/85 | HR 111 | Wt 199.2 lb

## 2020-12-10 DIAGNOSIS — E109 Type 1 diabetes mellitus without complications: Secondary | ICD-10-CM

## 2020-12-10 DIAGNOSIS — O0991 Supervision of high risk pregnancy, unspecified, first trimester: Secondary | ICD-10-CM | POA: Diagnosis not present

## 2020-12-10 DIAGNOSIS — F502 Bulimia nervosa: Secondary | ICD-10-CM | POA: Insufficient documentation

## 2020-12-10 DIAGNOSIS — F32A Depression, unspecified: Secondary | ICD-10-CM | POA: Diagnosis not present

## 2020-12-10 DIAGNOSIS — Z3482 Encounter for supervision of other normal pregnancy, second trimester: Secondary | ICD-10-CM | POA: Diagnosis not present

## 2020-12-10 DIAGNOSIS — F5081 Binge eating disorder: Secondary | ICD-10-CM

## 2020-12-10 DIAGNOSIS — Z3A14 14 weeks gestation of pregnancy: Secondary | ICD-10-CM

## 2020-12-10 DIAGNOSIS — F419 Anxiety disorder, unspecified: Secondary | ICD-10-CM

## 2020-12-10 DIAGNOSIS — F418 Other specified anxiety disorders: Secondary | ICD-10-CM

## 2020-12-10 LAB — POCT URINALYSIS DIPSTICK OB
Blood, UA: NEGATIVE
Glucose, UA: NEGATIVE
Ketones, UA: NEGATIVE
Leukocytes, UA: NEGATIVE
Nitrite, UA: NEGATIVE
POC,PROTEIN,UA: NEGATIVE

## 2020-12-10 MED ORDER — ASPIRIN 81 MG PO TBEC: 81.0000 mg | DELAYED_RELEASE_TABLET | Freq: Every day | ORAL | 3 refills | Status: DC

## 2020-12-10 NOTE — Progress Notes (Signed)
INITIAL OBSTETRICAL VISIT Patient name: Linda [name] MRN [id number]  Date of birth: 2000/11/02 Chief Complaint:   Initial Prenatal Visit  History of Present Illness:   Linda [name] is a 20 y.o. G66P0000 Caucasian female at [redacted]w[redacted]d by LMP c/w u/s at 10 weeks with an Estimated Date of Delivery: 06/06/21 being seen today for her initial obstetrical visit.   Patient's last menstrual period was 08/30/2020. Her obstetrical history is significant for primigravida.   Today she reports no complaints.  T1DM dx @ 20yo, currently on metformin 1,000mg , novolog w/ meals 1:5 ratio (<100u/day), tresiba 65u daily. 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 Recovering from binge eating disorder. Has a lot of anxiety over blood sugars now that she is pregnant. Was seeing a therapist, but hasn't been able to contact them. Is ok w/ IBH referral.  Last pap <21yo. Results were: N/A  Depression screen [facility] 2/9 12/10/2020 10/15/2020 07/10/2020 07/08/2020 05/27/2020  Decreased Interest 2 2 1 1 1   Down, Depressed, Hopeless 0 2 1 1 1   PHQ - 2 Score 2 4 2 2 2   Altered sleeping 3 3 1 1 1   Tired, decreased energy 3 3 3 3 2   Change in appetite 1 1 0 0 1  Feeling bad or failure about yourself  0 1 0 0 1  Trouble concentrating 1 3 3 3 2   Moving slowly or fidgety/restless 0 2 0 0 0  Suicidal thoughts 0 0 0 0 0  PHQ-9 Score 10 17 9 9 9   Difficult doing work/chores - - Somewhat difficult Somewhat difficult -  Some recent data might be hidden     GAD 7 : Generalized Anxiety Score 12/10/2020 10/15/2020 07/08/2020 05/27/2020  Nervous, Anxious, on Edge 1 3 1 2   Control/stop worrying 1 3 1 1   Worry too much - different things 1 3 1 1   Trouble relaxing 2 3 0 2  Restless 3 3 2 2   Easily annoyed or irritable 2 3 1 2   Afraid - awful might happen 1 3 1 1   Total GAD 7 Score 11 21 7 11   Anxiety Difficulty - - Somewhat difficult -     Review of Systems:   Pertinent items are noted in HPI Denies  cramping/contractions, leakage of fluid, vaginal bleeding, abnormal vaginal discharge w/ itching/odor/irritation, headaches, visual changes, shortness of breath, chest pain, abdominal pain, severe nausea/vomiting, or problems with urination or bowel movements unless otherwise stated above.  Pertinent History Reviewed:  Reviewed past medical,surgical, social, obstetrical and family history.  Reviewed problem list, medications and allergies. OB History  Gravida Para Term Preterm AB Living  1 0 0 0 0 0  SAB IAB Ectopic Multiple Live Births  0 0 0 0 0    # Outcome Date GA Lbr Len/2nd Weight Sex Delivery Anes PTL Lv  1 Current            Physical Assessment:   Vitals:   12/10/20 1433  BP: 124/85  Pulse: (!) 111  Weight: 199 lb 3.2 oz (90.4 kg)  Body mass index is 36.43 kg/m.       Physical Examination:  General appearance - well appearing, and in no distress  Mental status - alert, oriented to person, place, and time  Psych:  She has a normal mood and affect  Skin - warm and dry, normal color, no suspicious lesions noted  Chest - effort normal, all lung fields clear to auscultation bilaterally  Heart - normal  rate and regular rhythm  Abdomen - soft, nontender  Extremities:  No swelling or varicosities noted  Thin prep pap is not done   Chaperone: N/A    TODAY'S FHR: 152 via doppler  Results for orders placed or performed in visit on 12/10/20 (from the past 24 hour(s))  POC Urinalysis Dipstick OB   Collection Time: 12/10/20  2:50 PM  Result Value Ref Range   Color, UA     Clarity, UA     Glucose, UA Negative Negative   Bilirubin, UA     Ketones, UA neg    Spec Grav, UA     Blood, UA neg    pH, UA     POC,PROTEIN,UA Negative Negative, Trace, Small (1+), Moderate (2+), Large (3+), 4+   Urobilinogen, UA     Nitrite, UA neg    Leukocytes, UA Negative Negative   Appearance     Odor      Assessment & Plan:  1) High-Risk Pregnancy G1P0000 at [redacted]w[redacted]d with an Estimated Date  of Delivery: 06/06/21   2) Initial OB visit  3) T1DM> dx @ 20yo, currently on metformin 1,000mg  PM, novolog w/ meals 1:5 ratio<100u/day), tresiba 65u daily. Normal eye exam Feb this year, states endocrinologist is getting her set up for another w/ specialist. Start ASA 81mg . Check A1C, CMP, TSH, P:C. Has dexcom, start writing out QID sugars and bringing to appts   4) Dep/anx> on pristiq/buspar, IBH referral ordered  5) Recovering from binge eating disorder  Meds:  Meds ordered this encounter  Medications  . aspirin 81 MG EC tablet    Sig: Take 1 tablet (81 mg total) by mouth daily. Swallow whole.    Dispense:  90 tablet    Refill:  3    Order Specific Question:   Supervising Provider    Answer:   H [[id number]]    Initial labs obtained Continue prenatal vitamins Reviewed n/v relief measures and warning s/s to report Reviewed recommended weight gain based on pre-gravid BMI Encouraged well-balanced diet Genetic & carrier screening discussed: requests [facility], AFP and [facility] , no available appt for nt u/s Ultrasound discussed; fetal survey: requested CCNC completed> form faxed if has or is planning to apply for medicaid The nature of [facility] for [facility] with multiple MDs and other Advanced Practice Providers was explained to patient; also emphasized that fellows, residents, and students are part of our team. Does have home bp cuff. Office bp cuff given: no. Rx sent: no. Check bp weekly, let [employer] know if consistently >140/90.   Follow-up: Return for 2wks HROB and AFP Dr. [place]; then 4wks from now [facility] w/ anatomy u/s and MD only.   Orders Placed This Encounter  Procedures  . Urine Culture  . GC/Chlamydia Probe Amp  . Genetic Screening  . Pain Management Screening Profile (10S)  . CBC/D/Plt+RPR+Rh+ABO+RubIgG...  . Comprehensive metabolic panel  . TSH  . Hemoglobin A1c  . Protein / creatinine ratio, urine  . Amb ref to [facility]  .  POC Urinalysis Dipstick OB    [employer] CNM, [facility] 12/10/2020 3:38 PM

## 2020-12-10 NOTE — Patient Instructions (Addendum)
Linda [name], thank you for choosing our office today! We appreciate the opportunity to meet your healthcare needs. You may receive a short survey by mail, e-mail, or through [employer]. If you are happy with your care we would appreciate if you could take just a few minutes to complete the survey questions. We read all of your comments and take your feedback very seriously. Thank you again for choosing our office.  Center for [employer] Team at [facility] at [facility] ([address] [place], [place] [address]) Entrance C, located off of [address] valet parking   Check blood sugars 4 times a day: in the morning before eating/drinking anything (goal is <95) and 2 hours after your first bite of breakfast, lunch, and supper (goal is <120). Please keep a log (Example 12/01/20: 95, 120, 120, 120) and bring to each appointment.   Go to [email or web address] to register for FREE online childbirth classes  Call the office [phone]) or go to [employer] if:  You begin to severe cramping  Your water breaks.  Sometimes it is a big gush of fluid, sometimes it is just a trickle that keeps getting your panties wet or running down your legs  You have vaginal bleeding.  It is normal to have a small amount of spotting if your cervix was checked.   [facility] Pediatricians/Family Doctors  [facility]): [address] Colette Ribas, [phone]            [facility]: [address], [phone]                 [facility]): [address], [phone] (call to ask if accepting patients)  [facility] Department: [address], [place], [phone]    [facility] Pediatricians/Family Doctors  [facility]): [phone] [address], [phone]  [facility]: [address] [place], [phone]  [facility]: [address],  [phone]  [facility] Doctors   [facility]): [phone]  [facility]: [address] [phone]   [facility] Doctors  [facility]: [address] [phone]  [facility] Doctors  . [facility]: [address], [phone]  Home Blood Pressure Monitoring for Patients   Your provider has recommended that you check your blood pressure (BP) at least once a week at home. If you do not have a blood pressure cuff at home, one will be provided for you. Contact your provider if you have not received your monitor within 1 week.   Helpful Tips for Accurate Home Blood Pressure Checks  . Don't smoke, exercise, or drink caffeine 30 minutes before checking your BP . Use the restroom before checking your BP (a full bladder can raise your pressure) . Relax in a comfortable upright chair . Feet on the ground . Left arm resting comfortably on a flat surface at the level of your heart . Legs uncrossed . Back supported . Sit quietly and don't talk . Place the cuff on your bare arm . Adjust snuggly, so that only two fingertips can fit between your skin and the top of the cuff . Check 2 readings separated by at least one minute . Keep a log of your BP readings . For a visual, please reference this diagram: http://ccnc.care/bpdiagram  Provider Name: [facility]  Phone: [phone]  Zone 1: ALL CLEAR  Continue to monitor your symptoms:  . BP reading is less than 140 (top number) or less than 90 (bottom number)  . No right upper stomach pain . No headaches or seeing spots . No feeling nauseated or throwing up . No swelling in face and hands  Zone 2: CAUTION Call your doctor's office for any of the following:  . BP reading is greater than 140 (top number) or greater than 90 (bottom number)  . Stomach pain under your ribs in the middle or right side . Headaches or seeing  spots . Feeling nauseated or throwing up . Swelling in face and hands  Zone 3: EMERGENCY  Seek immediate medical care if you have any of the following:  . BP reading is greater than160 (top number) or greater than 110 (bottom number) . Severe headaches not improving with Tylenol . Serious difficulty catching your breath . Any worsening symptoms from Zone 2     Second Trimester of Pregnancy The second trimester is from week 14 through week 27 (months 4 through 6). The second trimester is often a time when you feel your best. Your body has adjusted to being pregnant, and you begin to feel better physically. Usually, morning sickness has lessened or quit completely, you may have more energy, and you may have an increase in appetite. The second trimester is also a time when the fetus is growing rapidly. At the end of the sixth month, the fetus is about 9 inches long and weighs about 1 pounds. You will likely begin to feel the baby move (quickening) between 16 and 20 weeks of pregnancy. Body changes during your second trimester Your body continues to go through many changes during your second trimester. The changes vary from woman to woman.  Your weight will continue to increase. You will notice your lower abdomen bulging out.  You may begin to get stretch marks on your hips, abdomen, and breasts.  You may develop headaches that can be relieved by medicines. The medicines should be approved by your health care provider.  You may urinate more often because the fetus is pressing on your bladder.  You may develop or continue to have heartburn as a result of your pregnancy.  You may develop constipation because certain hormones are causing the muscles that push waste through your intestines to slow down.  You may develop hemorrhoids or swollen, bulging veins (varicose veins).  You may have back pain. This is caused by: ? Weight gain. ? Pregnancy hormones that are relaxing the joints in your  pelvis. ? A shift in weight and the muscles that support your balance.  Your breasts will continue to grow and they will continue to become tender.  Your gums may bleed and may be sensitive to brushing and flossing.  Dark spots or blotches (chloasma, mask of pregnancy) may develop on your face. This will likely fade after the baby is born.  A dark line from your belly button to the pubic area (linea nigra) may appear. This will likely fade after the baby is born.  You may have changes in your hair. These can include thickening of your hair, rapid growth, and changes in texture. Some women also have hair loss during or after pregnancy, or hair that feels dry or thin. Your hair will most likely return to normal after your baby is born.  What to expect at prenatal visits During a routine prenatal visit:  You will be  weighed to make sure you and the fetus are growing normally.  Your blood pressure will be taken.  Your abdomen will be measured to track your baby's growth.  The fetal heartbeat will be listened to.  Any test results from the previous visit will be discussed.  Your health care provider may ask you:  How you are feeling.  If you are feeling the baby move.  If you have had any abnormal symptoms, such as leaking fluid, bleeding, severe headaches, or abdominal cramping.  If you are using any tobacco products, including cigarettes, chewing tobacco, and electronic cigarettes.  If you have any questions.  Other tests that may be performed during your second trimester include:  Blood tests that check for: ? Low iron levels (anemia). ? High blood sugar that affects pregnant women (gestational diabetes) between 6024 and 28 weeks. ? Rh antibodies. This is to check for a protein on red blood cells (Rh factor).  Urine tests to check for infections, diabetes, or protein in the urine.  An ultrasound to confirm the proper growth and development of the baby.  An amniocentesis  to check for possible genetic problems.  Fetal screens for spina bifida and Down syndrome.  HIV (human immunodeficiency virus) testing. Routine prenatal testing includes screening for HIV, unless you choose not to have this test.  Follow these instructions at home: Medicines  Follow your health care provider's instructions regarding medicine use. Specific medicines may be either safe or unsafe to take during pregnancy.  Take a prenatal vitamin that contains at least 600 micrograms (mcg) of folic acid.  If you develop constipation, try taking a stool softener if your health care provider approves. Eating and drinking  Eat a balanced diet that includes fresh fruits and vegetables, whole grains, good sources of protein such as meat, eggs, or tofu, and low-fat dairy. Your health care provider will help you determine the amount of weight gain that is right for you.  Avoid raw meat and uncooked cheese. These carry germs that can cause birth defects in the baby.  If you have low calcium intake from food, talk to your health care provider about whether you should take a daily calcium supplement.  Limit foods that are high in fat and processed sugars, such as fried and sweet foods.  To prevent constipation: ? Drink enough fluid to keep your urine clear or pale yellow. ? Eat foods that are high in fiber, such as fresh fruits and vegetables, whole grains, and beans. Activity  Exercise only as directed by your health care provider. Most women can continue their usual exercise routine during pregnancy. Try to exercise for 30 minutes at least 5 days a week. Stop exercising if you experience uterine contractions.  Avoid heavy lifting, wear low heel shoes, and practice good posture.  A sexual relationship may be continued unless your health care provider directs you otherwise. Relieving pain and discomfort  Wear a good support bra to prevent discomfort from breast tenderness.  Take warm sitz  baths to soothe any pain or discomfort caused by hemorrhoids. Use hemorrhoid cream if your health care provider approves.  Rest with your legs elevated if you have leg cramps or low back pain.  If you develop varicose veins, wear support hose. Elevate your feet for 15 minutes, 3-4 times a day. Limit salt in your diet. Prenatal Care  Write down your questions. Take them to your prenatal visits.  Keep all your prenatal visits as told by your health care provider.  This is important. Safety  Wear your seat belt at all times when driving.  Make a list of emergency phone numbers, including numbers for family, friends, the hospital, and police and fire departments. General instructions  Ask your health care provider for a referral to a local prenatal education class. Begin classes no later than the beginning of month 6 of your pregnancy.  Ask for help if you have counseling or nutritional needs during pregnancy. Your health care provider can offer advice or refer you to specialists for help with various needs.  Do not use hot tubs, steam rooms, or saunas.  Do not douche or use tampons or scented sanitary pads.  Do not cross your legs for long periods of time.  Avoid cat litter boxes and soil used by cats. These carry germs that can cause birth defects in the baby and possibly loss of the fetus by miscarriage or stillbirth.  Avoid all smoking, herbs, alcohol, and unprescribed drugs. Chemicals in these products can affect the formation and growth of the baby.  Do not use any products that contain nicotine or tobacco, such as cigarettes and e-cigarettes. If you need help quitting, ask your health care provider.  Visit your dentist if you have not gone yet during your pregnancy. Use a soft toothbrush to brush your teeth and be gentle when you floss. Contact a health care provider if:  You have dizziness.  You have mild pelvic cramps, pelvic pressure, or nagging pain in the abdominal  area.  You have persistent nausea, vomiting, or diarrhea.  You have a bad smelling vaginal discharge.  You have pain when you urinate. Get help right away if:  You have a fever.  You are leaking fluid from your vagina.  You have spotting or bleeding from your vagina.  You have severe abdominal cramping or pain.  You have rapid weight gain or weight loss.  You have shortness of breath with chest pain.  You notice sudden or extreme swelling of your face, hands, ankles, feet, or legs.  You have not felt your baby move in over an hour.  You have severe headaches that do not go away when you take medicine.  You have vision changes. Summary  The second trimester is from week 14 through week 27 (months 4 through 6). It is also a time when the fetus is growing rapidly.  Your body goes through many changes during pregnancy. The changes vary from woman to woman.  Avoid all smoking, herbs, alcohol, and unprescribed drugs. These chemicals affect the formation and growth your baby.  Do not use any tobacco products, such as cigarettes, chewing tobacco, and e-cigarettes. If you need help quitting, ask your health care provider.  Contact your health care provider if you have any questions. Keep all prenatal visits as told by your health care provider. This is important. This information is not intended to replace advice given to you by your health care provider. Make sure you discuss any questions you have with your health care provider. Document Released: 06/15/2001 Document Revised: 11/27/2015 Document Reviewed: 08/22/2012 Elsevier Interactive Patient Education  2017 [employer].

## 2020-12-11 LAB — CBC/D/PLT+RPR+RH+ABO+RUBIGG...
Antibody Screen: NEGATIVE
Basophils Absolute: 0 10*3/uL (ref 0.0–0.2)
Basos: 0 %
EOS (ABSOLUTE): 0.1 10*3/uL (ref 0.0–0.4)
Eos: 1 %
HCV Ab: <0.1 s/co ratio (ref 0.0–0.9)
HIV Screen 4th Generation wRfx: NONREACTIVE
Hematocrit: 38.7 % (ref 34.0–46.6)
Hemoglobin: 13.5 g/dL (ref 11.1–15.9)
Hepatitis B Surface Ag: NEGATIVE
Immature Grans (Abs): 0 10*3/uL (ref 0.0–0.1)
Immature Granulocytes: 0 %
Lymphocytes Absolute: 2.8 10*3/uL (ref 0.7–3.1)
Lymphs: 32 %
MCH: 31.3 pg (ref 26.6–33.0)
MCHC: 34.9 g/dL (ref 31.5–35.7)
MCV: 90 fL (ref 79–97)
Monocytes Absolute: 0.5 10*3/uL (ref 0.1–0.9)
Monocytes: 6 %
Neutrophils Absolute: 5.5 10*3/uL (ref 1.4–7.0)
Neutrophils: 61 %
Platelets: 279 10*3/uL (ref 150–450)
RBC: 4.32 x10E6/uL (ref 3.77–5.28)
RDW: 12.8 % (ref 11.7–15.4)
RPR Ser Ql: NONREACTIVE
Rh Factor: POSITIVE
Rubella Antibodies, IGG: 8.88 index (ref >0.99)
WBC: 8.9 10*3/uL (ref 3.4–10.8)

## 2020-12-11 LAB — COMPREHENSIVE METABOLIC PANEL
ALT: 15 IU/L (ref 0–32)
AST: 12 IU/L (ref 0–40)
Albumin/Globulin Ratio: 1.5 (ref 1.2–2.2)
Albumin: 4 g/dL (ref 3.9–5.0)
Alkaline Phosphatase: 57 IU/L (ref 42–106)
BUN/Creatinine Ratio: 8 — ABNORMAL LOW (ref 9–23)
BUN: 4 mg/dL — ABNORMAL LOW (ref 6–20)
Bilirubin Total: 0.2 mg/dL (ref 0.0–1.2)
CO2: 20 mmol/L (ref 20–29)
Calcium: 9.2 mg/dL (ref 8.7–10.2)
Chloride: 100 mmol/L (ref 96–106)
Creatinine, Ser: 0.53 mg/dL — ABNORMAL LOW (ref 0.57–1.00)
Globulin, Total: 2.7 g/dL (ref 1.5–4.5)
Glucose: 73 mg/dL (ref 65–99)
Potassium: 4.2 mmol/L (ref 3.5–5.2)
Sodium: 138 mmol/L (ref 134–144)
Total Protein: 6.7 g/dL (ref 6.0–8.5)
eGFR: 136 mL/min/{1.73_m2} (ref >59)

## 2020-12-11 LAB — TSH: TSH: 6 u[IU]/mL — ABNORMAL HIGH (ref 0.450–4.500)

## 2020-12-11 LAB — HCV INTERPRETATION

## 2020-12-11 LAB — PROTEIN / CREATININE RATIO, URINE
Creatinine, Urine: 20.5 mg/dL
Protein, Ur: <4 mg/dL

## 2020-12-11 LAB — HEMOGLOBIN A1C
Est. average glucose Bld gHb Est-mCnc: 166 mg/dL
Hgb A1c MFr Bld: 7.4 % — ABNORMAL HIGH (ref 4.8–5.6)

## 2020-12-12 LAB — PMP SCREEN PROFILE (10S), URINE
Amphetamine Scrn, Ur: NEGATIVE ng/mL
BARBITURATE SCREEN URINE: NEGATIVE ng/mL
BENZODIAZEPINE SCREEN, URINE: NEGATIVE ng/mL
CANNABINOIDS UR QL SCN: NEGATIVE ng/mL
Cocaine (Metab) Scrn, Ur: NEGATIVE ng/mL
Creatinine(Crt), U: 21.7 mg/dL (ref 20.0–300.0)
Methadone Screen, Urine: NEGATIVE ng/mL
OXYCODONE+OXYMORPHONE UR QL SCN: NEGATIVE ng/mL
Opiate Scrn, Ur: NEGATIVE ng/mL
Ph of Urine: 6.7 (ref 4.5–8.9)
Phencyclidine Qn, Ur: NEGATIVE ng/mL
Propoxyphene Scrn, Ur: NEGATIVE ng/mL

## 2020-12-12 LAB — GC/CHLAMYDIA PROBE AMP
Chlamydia trachomatis, NAA: NEGATIVE
Neisseria Gonorrhoeae by PCR: NEGATIVE

## 2020-12-13 LAB — URINE CULTURE

## 2020-12-15 ENCOUNTER — Encounter: Payer: Self-pay | Admitting: Family Medicine

## 2020-12-15 ENCOUNTER — Ambulatory Visit: Payer: Medicaid Other | Admitting: Family Medicine

## 2020-12-15 ENCOUNTER — Encounter: Payer: Self-pay | Admitting: Women's Health

## 2020-12-15 DIAGNOSIS — E039 Hypothyroidism, unspecified: Secondary | ICD-10-CM | POA: Insufficient documentation

## 2020-12-15 MED ORDER — LEVOTHYROXINE SODIUM 25 MCG PO TABS: 25.0000 ug | ORAL_TABLET | Freq: Every day | ORAL | 3 refills | Status: DC

## 2020-12-15 NOTE — Addendum Note (Signed)
Addended by: Cheral Marker on: 12/15/2020 04:15 PM   Modules accepted: Orders

## 2020-12-23 ENCOUNTER — Encounter: Payer: Self-pay | Admitting: Women's Health

## 2020-12-29 ENCOUNTER — Ambulatory Visit (INDEPENDENT_AMBULATORY_CARE_PROVIDER_SITE_OTHER): Payer: Medicaid Other | Admitting: Obstetrics & Gynecology

## 2020-12-29 ENCOUNTER — Encounter: Payer: Self-pay | Admitting: Obstetrics & Gynecology

## 2020-12-29 ENCOUNTER — Other Ambulatory Visit: Payer: Self-pay

## 2020-12-29 VITALS — Wt 202.5 lb

## 2020-12-29 DIAGNOSIS — O24319 Unspecified pre-existing diabetes mellitus in pregnancy, unspecified trimester: Secondary | ICD-10-CM

## 2020-12-29 DIAGNOSIS — Z1379 Encounter for other screening for genetic and chromosomal anomalies: Secondary | ICD-10-CM

## 2020-12-29 DIAGNOSIS — O0992 Supervision of high risk pregnancy, unspecified, second trimester: Secondary | ICD-10-CM

## 2020-12-29 DIAGNOSIS — Z3A17 17 weeks gestation of pregnancy: Secondary | ICD-10-CM

## 2020-12-29 LAB — POCT URINALYSIS DIPSTICK OB
Blood, UA: NEGATIVE
Ketones, UA: NEGATIVE
Leukocytes, UA: NEGATIVE
Nitrite, UA: NEGATIVE
POC,PROTEIN,UA: NEGATIVE

## 2020-12-29 NOTE — Progress Notes (Signed)
HIGH-RISK PREGNANCY VISIT Patient name: Linda [name] MRN [id number]  Date of birth: Mar 19, 2001 Chief Complaint:   High Risk Gestation (AFP today)  History of Present Illness:   Linda [name] is a 20 y.o. 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    Today she reports no complaints. Contractions: Not present. Vag. Bleeding: None.  Movement: Present. denies leaking of fluid.   Depression screen [facility] 2/9 12/10/2020 10/15/2020 07/10/2020 07/08/2020 05/27/2020  Decreased Interest 2 2 1 1 1   Down, Depressed, Hopeless 0 2 1 1 1   PHQ - 2 Score 2 4 2 2 2   Altered sleeping 3 3 1 1 1   Tired, decreased energy 3 3 3 3 2   Change in appetite 1 1 0 0 1  Feeling bad or failure about yourself  0 1 0 0 1  Trouble concentrating 1 3 3 3 2   Moving slowly or fidgety/restless 0 2 0 0 0  Suicidal thoughts 0 0 0 0 0  PHQ-9 Score 10 17 9 9 9   Difficult doing work/chores - - Somewhat difficult Somewhat difficult -  Some recent data might be hidden     GAD 7 : Generalized Anxiety Score 12/10/2020 10/15/2020 07/08/2020 05/27/2020  Nervous, Anxious, on Edge 1 3 1 2   Control/stop worrying 1 3 1 1   Worry too much - different things 1 3 1 1   Trouble relaxing 2 3 0 2  Restless 3 3 2 2   Easily annoyed or irritable 2 3 1 2   Afraid - awful might happen 1 3 1 1   Total GAD 7 Score 11 21 7 11   Anxiety Difficulty - - Somewhat difficult -     Review of Systems:   Pertinent items are noted in HPI Denies abnormal vaginal discharge w/ itching/odor/irritation, headaches, visual changes, shortness of breath, chest pain, abdominal pain, severe nausea/vomiting, or problems with urination or bowel movements unless otherwise stated above. Pertinent History Reviewed:  Reviewed past medical,surgical, social, obstetrical and family history.  Reviewed problem list, medications and  allergies. Physical Assessment:   Vitals:   12/29/20 1436  Weight: 202 lb 8 oz (91.9 kg)  Body mass index is 37.04 kg/m.           Physical Examination:   General appearance: alert, well appearing, and in no distress  Mental status: alert, oriented to person, place, and time  Skin: warm & dry   Extremities: Edema: None    Cardiovascular: normal heart rate noted  Respiratory: normal respiratory effort, no distress  Abdomen: gravid, soft, non-tender  Pelvic: Cervical exam deferred         Fetal Status:     Movement: Present    Fetal Surveillance Testing today: AFP   Chaperone: N/A    No results found for this or any previous visit (from the past 24 hour(s)).   Assessment & Plan:  High-risk pregnancy: G1P0000 at [redacted]w[redacted]d with an Estimated Date of Delivery: 06/06/21   1) Class C DM, patterning,   2) Dpression anxiety, stable  Meds: No orders of the defined types were placed in this encounter.   Labs/procedures today: labs  Treatment Plan:  short interval follow up with meds adjustment  Reviewed: Preterm labor symptoms and general obstetric precautions including but not limited to vaginal bleeding, contractions, leaking of fluid and fetal movement were reviewed in detail with the patient.  All questions were answered. Does have home bp cuff. Office bp cuff given: yes. Check bp weekly, let us know if consistently >140 and/or >90.  Follow-up: Return in about 3 weeks (around 01/19/2021) for 20 week sono, Follow up, with Dr Despina Hidden.   Future Appointments  Date Time Provider Department Center  02/03/2021  1:00 PM Raliegh Ip, [place] [facility] None  02/04/2021  2:15 PM [facility] [place] [facility]  02/23/2021  1:50 PM Lazaro Arms, [place] [facility]    Orders Placed This Encounter  Procedures   AFP, Serum, Open Spina Bifida   POC Urinalysis Dipstick OB   Lazaro Arms  02/02/2021 8:44 AM

## 2020-12-31 LAB — AFP, SERUM, OPEN SPINA BIFIDA
AFP MoM: 0.85
AFP Value: 23.2 ng/mL
Gest. Age on Collection Date: 17.2 weeks
Maternal Age At EDD: 21.1 yr
OSBR Risk 1 IN: 6423
Test Results:: NEGATIVE
Weight: 203 [lb_av]

## 2021-01-09 ENCOUNTER — Other Ambulatory Visit: Payer: Self-pay | Admitting: Obstetrics & Gynecology

## 2021-01-09 ENCOUNTER — Other Ambulatory Visit: Payer: Self-pay | Admitting: Family Medicine

## 2021-01-09 DIAGNOSIS — F329 Major depressive disorder, single episode, unspecified: Secondary | ICD-10-CM

## 2021-01-09 DIAGNOSIS — O24319 Unspecified pre-existing diabetes mellitus in pregnancy, unspecified trimester: Secondary | ICD-10-CM

## 2021-01-09 DIAGNOSIS — F410 Panic disorder [episodic paroxysmal anxiety] without agoraphobia: Secondary | ICD-10-CM

## 2021-01-09 DIAGNOSIS — Z363 Encounter for antenatal screening for malformations: Secondary | ICD-10-CM

## 2021-01-09 DIAGNOSIS — F32A Depression, unspecified: Secondary | ICD-10-CM

## 2021-01-12 ENCOUNTER — Ambulatory Visit (INDEPENDENT_AMBULATORY_CARE_PROVIDER_SITE_OTHER): Payer: Medicaid Other

## 2021-01-12 ENCOUNTER — Encounter: Payer: Self-pay | Admitting: Obstetrics & Gynecology

## 2021-01-12 ENCOUNTER — Other Ambulatory Visit: Payer: Self-pay

## 2021-01-12 ENCOUNTER — Ambulatory Visit (INDEPENDENT_AMBULATORY_CARE_PROVIDER_SITE_OTHER): Payer: Medicaid Other | Admitting: Obstetrics & Gynecology

## 2021-01-12 VITALS — BP 121/79 | HR 117 | Wt 200.5 lb

## 2021-01-12 DIAGNOSIS — Z3A19 19 weeks gestation of pregnancy: Secondary | ICD-10-CM

## 2021-01-12 DIAGNOSIS — Z363 Encounter for antenatal screening for malformations: Secondary | ICD-10-CM | POA: Diagnosis not present

## 2021-01-12 DIAGNOSIS — O24319 Unspecified pre-existing diabetes mellitus in pregnancy, unspecified trimester: Secondary | ICD-10-CM

## 2021-01-12 DIAGNOSIS — E109 Type 1 diabetes mellitus without complications: Secondary | ICD-10-CM

## 2021-01-12 DIAGNOSIS — O0992 Supervision of high risk pregnancy, unspecified, second trimester: Secondary | ICD-10-CM

## 2021-01-12 LAB — POCT URINALYSIS DIPSTICK OB
Blood, UA: NEGATIVE
Glucose, UA: NEGATIVE
Ketones, UA: NEGATIVE
Leukocytes, UA: NEGATIVE
Nitrite, UA: NEGATIVE
POC,PROTEIN,UA: NEGATIVE

## 2021-01-12 NOTE — Progress Notes (Signed)
HIGH-RISK PREGNANCY VISIT Patient name: Linda [name] MRN [id number]  Date of birth: [birth year] Chief Complaint:   High Risk Gestation ([place] today)  History of Present Illness:   Linda [name] is a 20 y.o. G71P0000 female at [redacted]w[redacted]d with an Estimated Date of Delivery: 06/06/21 being seen today for ongoing management of a high-risk pregnancy complicated by diabetes mellitus T1DM: Class C, currently on Tresiba 65 units daily with meal coverage at 5 units novolg + 1:5 carb ratio coverage .    Today she reports no complaints. Contractions: Not present. Vag. Bleeding: None.  Movement: Present. denies leaking of fluid.   Depression screen [facility] 2/9 12/10/2020 10/15/2020 07/10/2020 07/08/2020 05/27/2020  Decreased Interest 2 2 1 1 1   Down, Depressed, Hopeless 0 2 1 1 1   PHQ - 2 Score 2 4 2 2 2   Altered sleeping 3 3 1 1 1   Tired, decreased energy 3 3 3 3 2   Change in appetite 1 1 0 0 1  Feeling bad or failure about yourself  0 1 0 0 1  Trouble concentrating 1 3 3 3 2   Moving slowly or fidgety/restless 0 2 0 0 0  Suicidal thoughts 0 0 0 0 0  PHQ-9 Score 10 17 9 9 9   Difficult doing work/chores - - Somewhat difficult Somewhat difficult -  Some recent data might be hidden     GAD 7 : Generalized Anxiety Score 12/10/2020 10/15/2020 07/08/2020 05/27/2020  Nervous, Anxious, on Edge 1 3 1 2   Control/stop worrying 1 3 1 1   Worry too much - different things 1 3 1 1   Trouble relaxing 2 3 0 2  Restless 3 3 2 2   Easily annoyed or irritable 2 3 1 2   Afraid - awful might happen 1 3 1 1   Total GAD 7 Score 11 21 7 11   Anxiety Difficulty - - Somewhat difficult -     Review of Systems:   Pertinent items are noted in HPI Denies abnormal vaginal discharge w/ itching/odor/irritation, headaches, visual changes, shortness of breath, chest pain, abdominal pain, severe nausea/vomiting, or problems with urination or bowel movements unless otherwise stated above. Pertinent History Reviewed:  Reviewed past  medical,surgical, social, obstetrical and family history.  Reviewed problem list, medications and allergies. Physical Assessment:   Vitals:   01/12/21 1527  BP: 121/79  Pulse: (!) 117  Weight: 200 lb 8 oz (90.9 kg)  Body mass index is 36.67 kg/m.           Physical Examination:   General appearance: alert, well appearing, and in no distress  Mental status: alert, oriented to person, place, and time  Skin: warm & dry   Extremities: Edema: None    Cardiovascular: normal heart rate noted  Respiratory: normal respiratory effort, no distress  Abdomen: gravid, soft, non-tender  Pelvic: Cervical exam deferred         Fetal Status:     Movement: Present    Fetal Surveillance Testing today: sonogram   Chaperone: N/A    Results for orders placed or performed in visit on 01/12/21 (from the past 24 hour(s))  POC Urinalysis Dipstick OB   Collection Time: 01/12/21  3:21 PM  Result Value Ref Range   Color, UA     Clarity, UA     Glucose, UA Negative Negative   Bilirubin, UA     Ketones, UA neg    Spec Grav, UA     Blood, UA neg    pH, UA  POC,PROTEIN,UA Negative Negative, Trace, Small (1+), Moderate (2+), Large (3+), 4+   Urobilinogen, UA     Nitrite, UA neg    Leukocytes, UA Negative Negative   Appearance     Odor      Assessment & Plan:  High-risk pregnancy: G1P0000 at [redacted]w[redacted]d with an Estimated Date of Delivery: 06/06/21   1) Class C DM, stable, CBG are nearly all good, continue current regimen  2) hypothyroid, recheck in 9/22  Meds: No orders of the defined types were placed in this encounter.   Labs/procedures today: U/S  Treatment Plan:  follow up with Dr Despina Hidden 2 weeks  Reviewed: Preterm labor symptoms and general obstetric precautions including but not limited to vaginal bleeding, contractions, leaking of fluid and fetal movement were reviewed in detail with the patient.  All questions were answered. Does have home bp cuff. Office bp cuff given: not applicable.  Check bp daily, let us know if consistently >140 and/or >90.  Follow-up: No follow-ups on file.   Future Appointments  Date Time Provider Department Center  02/03/2021  1:00 PM Delynn Flavin [place], DO [facility] None    Orders Placed This Encounter  Procedures   POC Urinalysis Dipstick OB   Linda [name]  01/12/2021 4:04 PM

## 2021-01-12 NOTE — Progress Notes (Signed)
[place] 19+2 wks,cephalic,cx 3.6 cm,anterior placenta 0,svp 4.8 cm,148 bpm,EFW 258 g 21%,limited view of heart,please have pt come back for additional images,

## 2021-01-19 ENCOUNTER — Other Ambulatory Visit: Payer: Self-pay | Admitting: *Deleted

## 2021-01-19 DIAGNOSIS — O0992 Supervision of high risk pregnancy, unspecified, second trimester: Secondary | ICD-10-CM

## 2021-01-19 DIAGNOSIS — E109 Type 1 diabetes mellitus without complications: Secondary | ICD-10-CM

## 2021-01-21 ENCOUNTER — Other Ambulatory Visit: Payer: Self-pay

## 2021-01-21 ENCOUNTER — Ambulatory Visit (INDEPENDENT_AMBULATORY_CARE_PROVIDER_SITE_OTHER): Payer: Medicaid Other | Admitting: Nurse Practitioner

## 2021-01-21 ENCOUNTER — Encounter: Payer: Self-pay | Admitting: Nurse Practitioner

## 2021-01-21 VITALS — BP 118/76 | HR 108 | Temp 97.8°F | Ht 62.0 in | Wt 201.0 lb

## 2021-01-21 DIAGNOSIS — R102 Pelvic and perineal pain: Secondary | ICD-10-CM | POA: Diagnosis not present

## 2021-01-21 NOTE — Assessment & Plan Note (Addendum)
Tender spot suspicious of cyst but too early to conclude any medical problem.  I provided education to patient that pregnancy changes a woman's body overall as the baby grows,  grows, expansion is expected, advised patient to apply warm or cold compress as tolerated, Tylenol and no  Anti-inflammatory at this time as she is [redacted] weeks pregnant.  Advised patient to follow-up with worsening symptoms, fever, lethargy, nausea vomiting.  Patient verbalized understanding.

## 2021-01-21 NOTE — Progress Notes (Signed)
Acute Office Visit  Subjective:    Patient ID: Linda [name], female    DOB: Nov 10, 2000, 20 y.o.   MRN: [id number]  Chief Complaint  Patient presents with   Cyst    Vaginal Pain The patient's pertinent negatives include no genital itching, genital lesions, genital odor, genital rash, pelvic pain or vaginal discharge. Primary symptoms comment: Tender spot left side libia. This is a new problem. The current episode started in the past 7 days. The problem occurs intermittently. The problem has been unchanged. The pain is moderate. The problem affects the left side. She is pregnant. Pertinent negatives include no abdominal pain, anorexia, back pain, constipation, fever, flank pain, headaches, hematuria or joint pain. Nothing aggravates the symptoms. She has tried nothing for the symptoms. She is sexually active. It is unknown whether or not her partner has an STD. Her menstrual history has been regular.    Past Medical History:  Diagnosis Date   Allergy    Anxiety    Asthma    Carpal tunnel syndrome    Depression    Diabetes mellitus without complication ([place]) 0165   Type 1   Tachycardia    Tinnitus     Past Surgical History:  Procedure Laterality Date   ADENOIDECTOMY     TONSILLECTOMY     WISDOM TOOTH EXTRACTION      Family History  Problem Relation Age of Onset   Hypertension Mother    Thyroid disease Mother    Asthma Brother    Thyroid disease Brother    COPD Paternal Grandmother    Hypertension Paternal Grandmother    COPD Maternal Grandmother    Lumbar disc disease Maternal Grandmother    Dementia Maternal Grandmother    Heart attack Maternal Grandfather    Emphysema Maternal Grandfather    Ankylosing spondylitis Maternal Grandfather    Dementia Maternal Grandfather     Social History   Socioeconomic History   Marital status: Soil scientist    Spouse name: Not on file   Number of children: Not on file   Years of education: Not on file   Highest  education level: Not on file  Occupational History   Not on file  Tobacco Use   Smoking status: Never    Passive exposure: Yes   Smokeless tobacco: Never  Vaping Use   Vaping Use: Former  Substance and Sexual Activity   Alcohol use: No   Drug use: No   Sexual activity: Yes    Birth control/protection: None  Other Topics Concern   Not on file  Social History Narrative   Not on file   Social Determinants of Health   Financial Resource Strain: Low Risk    Difficulty of Paying Living Expenses: Not hard at all  Food Insecurity: No Food Insecurity   Worried About Charity fundraiser in the Last Year: Never true   [place] in the Last Year: Never true  Transportation Needs: No Transportation Needs   Lack of Transportation (Medical): No   Lack of Transportation (Non-Medical): No  Physical Activity: Inactive   Days of Exercise per Week: 0 days   Minutes of Exercise per Session: 0 min  Stress: Stress Concern Present   Feeling of Stress : Very much  Social Connections: Moderately Isolated   Frequency of Communication with Friends and Family: More than three times a week   Frequency of Social Gatherings with Friends and Family: More than three times a week  Attends Religious Services: Never   Active Member of Clubs or Organizations: No   Attends Archivist Meetings: Never   Marital Status: Living with partner  Intimate Partner Violence: Not At Risk   Fear of Current or Ex-Partner: No   Emotionally Abused: No   Physically Abused: No   Sexually Abused: No    Outpatient Medications Prior to Visit  Medication Sig Dispense Refill   aspirin 81 MG EC tablet Take 1 tablet (81 mg total) by mouth daily. Swallow whole. 90 tablet 3   busPIRone (BUSPAR) 15 MG tablet TAKE 1 TABLET BY MOUTH 2 TIMES DAILY. 60 tablet 0   Cholecalciferol (VITAMIN D3 PO) Take by mouth daily.     Continuous Blood Gluc Sensor (DEXCOM G6 SENSOR) MISC SMARTSIG:Topical Every 10 Days      Desvenlafaxine Succinate ER 25 MG TB24 Take 25 mg by mouth daily. 90 tablet 0   glucagon 1 MG injection Inject 1 mg into the muscle once as needed (for blood sugar).     insulin degludec (TRESIBA FLEXTOUCH) 100 UNIT/ML FlexTouch Pen 65 Units daily.     levothyroxine (SYNTHROID) 25 MCG tablet Take 1 tablet (25 mcg total) by mouth daily before breakfast. 90 tablet 3   loratadine (CLARITIN) 10 MG tablet TAKE 1 TABLET BY MOUTH EVERY DAY 30 tablet 11   MELATONIN PO Take by mouth as needed.     metFORMIN (GLUCOPHAGE) 500 MG tablet Take 500 mg by mouth 2 (two) times daily with a meal.     NOVOLOG FLEXPEN 100 UNIT/ML FlexPen SMARTSIG:0-90 Unit(s) SUB-Q Daily     omeprazole (PRILOSEC) 20 MG capsule Take 1 capsule (20 mg total) by mouth daily. 7 capsule 0   Prenatal Vit-Fe Fumarate-FA (PRENATAL VITAMIN PO) Take 1 tablet by mouth daily.     promethazine (PHENERGAN) 25 MG tablet Take 1 tablet (25 mg total) by mouth every 6 (six) hours as needed for nausea or vomiting. 30 tablet 1   No facility-administered medications prior to visit.    No Known Allergies  Review of Systems  Constitutional: Negative.  Negative for fever.  Gastrointestinal:  Negative for abdominal pain, anorexia and constipation.  Genitourinary:  Positive for vaginal pain. Negative for flank pain, hematuria, pelvic pain and vaginal discharge.  Musculoskeletal:  Negative for back pain and joint pain.  Neurological:  Negative for headaches.  All other systems reviewed and are negative.     Objective:    Physical Exam Vitals and nursing note reviewed. Exam conducted with a chaperone present.  Constitutional:      Appearance: Normal appearance.  HENT:     Head: Normocephalic.     Mouth/Throat:     Mouth: Mucous membranes are moist.     Pharynx: Oropharynx is clear.  Eyes:     Conjunctiva/sclera: Conjunctivae normal.  Cardiovascular:     Pulses: Normal pulses.     Heart sounds: Normal heart sounds.  Pulmonary:     Effort:  Pulmonary effort is normal.     Breath sounds: Normal breath sounds.  Abdominal:     General: Bowel sounds are normal.  Genitourinary:    General: Normal vulva.     Exam position: Supine.     Pubic Area: No rash.      Labia:        Left: Tenderness present. No rash, lesion or injury.        Comments: Tender palpable spot   Skin:    Findings: No rash.  Neurological:  Mental Status: She is alert and oriented to person, place, and time.    BP 118/76   Pulse (!) 108   Temp 97.8 F (36.6 C) (Temporal)   Ht '5\' 2"'  (1.575 m)   Wt 201 lb (91.2 kg)   LMP 08/30/2020   BMI 36.76 kg/m  Wt Readings from Last 3 Encounters:  01/21/21 201 lb (91.2 kg)  01/12/21 200 lb 8 oz (90.9 kg)  12/29/20 202 lb 8 oz (91.9 kg)    Health Maintenance Due  Topic Date Due   FOOT EXAM  Never done   OPHTHALMOLOGY EXAM  Never done   URINE MICROALBUMIN  Never done   HPV VACCINES (3 - 2-dose series) 05/28/2015   COVID-19 Vaccine (3 - Booster for Moderna series) 08/11/2020       Topic Date Due   HPV VACCINES (3 - 2-dose series) 05/28/2015     Lab Results  Component Value Date   TSH 6.000 (H) 12/10/2020   Lab Results  Component Value Date   WBC 8.9 12/10/2020   HGB 13.5 12/10/2020   HCT 38.7 12/10/2020   MCV 90 12/10/2020   PLT 279 12/10/2020   Lab Results  Component Value Date   NA 138 12/10/2020   K 4.2 12/10/2020   CO2 20 12/10/2020   GLUCOSE 73 12/10/2020   BUN 4 (L) 12/10/2020   CREATININE 0.53 (L) 12/10/2020   BILITOT 0.2 12/10/2020   ALKPHOS 57 12/10/2020   AST 12 12/10/2020   ALT 15 12/10/2020   PROT 6.7 12/10/2020   ALBUMIN 4.0 12/10/2020   CALCIUM 9.2 12/10/2020   ANIONGAP 11 12/05/2019   EGFR 136 12/10/2020   GFR 88.02 08/15/2018   No results found for: CHOL No results found for: HDL No results found for: LDLCALC No results found for: TRIG No results found for: CHOLHDL Lab Results  Component Value Date   HGBA1C 7.4 (H) 12/10/2020       Assessment &  Plan:   Problem List Items Addressed This Visit       Other   Vaginal pain - Primary    Tender spot suspicious of cyst but too early to conclude any medical problem.  I provided education to patient that pregnancy changes a woman's body overall as the baby grows,  grows, expansion is expected, advised patient to apply warm or cold compress as tolerated, Tylenol and no  Anti-inflammatory at this time as she is [redacted] weeks pregnant.  Advised patient to follow-up with worsening symptoms, fever, lethargy, nausea vomiting.  Patient verbalized understanding.         No orders of the defined types were placed in this encounter.    Ivy Lynn, NP

## 2021-01-21 NOTE — Patient Instructions (Signed)
Dyspareunia, Female Dyspareunia is pain that is associated with sexual activity. This can affect any part of the genitals or lower abdomen. There are many possible causes of this condition. In some cases, diagnosing the cause of dyspareunia can be difficult. This condition can be mild, moderate, or severe. Depending on the cause, dyspareunia may get better with treatment, but may return (recur) over time. What are the causes? The cause of this condition is not always known. However, problems that affect the vulva, vagina, uterus, and other organs may cause dyspareunia. Common causes of this condition include: Severe pain and tenderness of the vulva when it is touched (vulvodynia). Vaginal dryness. Giving birth. Infection. Skin changes or conditions. Side effects of medicines. Endometriosis. This is when tissue that is like the lining of the uterus grows on the outside of the uterus. Psychological conditions. These include depression, anxiety, or traumatic experiences. Allergic reaction. What increases the risk? The following factors may make you more likely to develop this condition: History of physical or sexual trauma. Some medicines. No longer having a monthly period (menopause). Having recently given birth. Taking baths using soaps that have perfumes. These can cause irritation. Douching. What are the signs or symptoms? The main symptom of this condition is pain in any part of your genitals or lower abdomen during or after sex. This may include: Irritation, burning, or stinging sensations in your vulva. Discomfort when your vulva or surrounding area is touched. Aching and throbbing pain that may be constant. Pain that gets worse when something is inserted into your vagina. How is this diagnosed? This condition may be diagnosed based on: Your symptoms, including where and when your pain occurs. Your medical history. A physical exam. A pelvic exam will most likely be done. Tests  that include ultrasound, blood tests, and tests that check the body for infection. Imaging tests, such as X-ray, MRI, and CT scan. You may be referred to a health care provider who specializes in women's health (gynecologist). How is this treated? Treatment depends on the cause of your condition and your symptoms. In most cases, you may need to stop sexual activity until your symptoms go away or get better. Treatment may include: Lubricants, ointments, and creams. Physical therapy. Massage therapy. Hormonal therapy. Medicines to: Prevent or fight infection. Relieve pain. Help numb the area. Treat depression (antidepressants). Counseling, which may include sex therapy. Surgery. Follow these instructions at home: Lifestyle Wear cotton underwear. Use water-based lubricants as needed during sex. Avoid oil-based lubricants. Do not use any products that can cause irritation. This may include certain condoms, spermicides, lubricants, soaps, tampons, vaginal sprays, or douches. Always practice safe sex. Use a condom to prevent sexually transmitted infections (STIs). Talk freely with your partner about your condition. General instructions Take or apply over-the-counter and prescription medicines only as told by your health care provider. Urinate before you have sex. Consider joining a support group. Get the results of any tests you have done. Ask your health care provider, or the department that is doing the procedure, when your results will be ready. Keep all follow-up visits as told by your health care provider. This is important. Contact a health care provider if: You have vaginal bleeding after having sex. You develop a lump at the opening of your vagina even if the lump is painless. You have: Abnormal discharge from your vagina. Vaginal dryness. Itchiness or irritation of your vulva or vagina. A new rash. Symptoms that get worse or do not improve with treatment. A fever.   Pain when  you urinate. Blood in your urine. Get help right away if: You have severe pain in your abdomen during or shortly after sex. You pass out after sex. Summary Dyspareunia is pain that is associated with sexual activity. This can affect any part of the genitals or lower abdomen. There are many causes of this condition. Treatment depends on the cause and your symptoms. In most cases, you may need to stop sexual activity until your symptoms improve. Take or apply over-the-counter and prescription medicines only as told by your health care provider. Contact a health care provider if your symptoms get worse or do not improve with treatment. Keep all follow-up visits as told by your health care provider. This is important. This information is not intended to replace advice given to you by your health care provider. Make sure you discuss any questions you have with your health care provider. Document Revised: 08/02/2019 Document Reviewed: 08/28/2018 Elsevier Patient Education  2022 Elsevier Inc.  

## 2021-01-26 ENCOUNTER — Ambulatory Visit (INDEPENDENT_AMBULATORY_CARE_PROVIDER_SITE_OTHER): Payer: Medicaid Other | Admitting: Obstetrics & Gynecology

## 2021-01-26 ENCOUNTER — Encounter: Payer: Self-pay | Admitting: Obstetrics & Gynecology

## 2021-01-26 ENCOUNTER — Other Ambulatory Visit: Payer: Self-pay

## 2021-01-26 VITALS — BP 127/80 | HR 104 | Wt 204.4 lb

## 2021-01-26 DIAGNOSIS — Z3A21 21 weeks gestation of pregnancy: Secondary | ICD-10-CM

## 2021-01-26 DIAGNOSIS — E039 Hypothyroidism, unspecified: Secondary | ICD-10-CM | POA: Diagnosis not present

## 2021-01-26 DIAGNOSIS — Z1389 Encounter for screening for other disorder: Secondary | ICD-10-CM

## 2021-01-26 DIAGNOSIS — O0992 Supervision of high risk pregnancy, unspecified, second trimester: Secondary | ICD-10-CM

## 2021-01-26 LAB — POCT URINALYSIS DIPSTICK OB
Blood, UA: NEGATIVE
Glucose, UA: NEGATIVE
Ketones, UA: NEGATIVE
Leukocytes, UA: NEGATIVE
Nitrite, UA: NEGATIVE
POC,PROTEIN,UA: NEGATIVE

## 2021-01-26 NOTE — Progress Notes (Signed)
HIGH-RISK PREGNANCY VISIT Patient name: Linda [name] MRN [id number]  Date of birth: 15-Dec-2000 Chief Complaint:   Routine Prenatal Visit and High Risk Gestation  History of Present Illness:   Linda [name] is a 20 y.o. G53P0000 female at [redacted]w[redacted]d with an Estimated Date of Delivery: 06/06/21 being seen today for ongoing management of a high-risk pregnancy complicated by: -Class C DM-   Echo scheduled 8/1  Metformin 500mg  2 tabs daily  Tresiba 65u evening  Novolog- 10units + add'l 1-5 pending carbs Sugars are currently 170-180 after meals Fastings- occasionally 120-130, but mostly 60-80s -followed by Endo @ [facility]   -Hypothyroid- synthroid 08-08-1976 daily- [] repeat TSH today Pt admits to non-compliance- hard to take am medication, states she takes the meds maybe 50% of time  -Dep/anxiety; on Pristiq/Buspar daily.    Today she reports no complaints.   Contractions: Not present. Vag. Bleeding: None.  Movement: Present. denies leaking of fluid.   Depression screen [facility] 2/9 12/10/2020 10/15/2020 07/10/2020 07/08/2020 05/27/2020  Decreased Interest 2 2 1 1 1   Down, Depressed, Hopeless 0 2 1 1 1   PHQ - 2 Score 2 4 2 2 2   Altered sleeping 3 3 1 1 1   Tired, decreased energy 3 3 3 3 2   Change in appetite 1 1 0 0 1  Feeling bad or failure about yourself  0 1 0 0 1  Trouble concentrating 1 3 3 3 2   Moving slowly or fidgety/restless 0 2 0 0 0  Suicidal thoughts 0 0 0 0 0  PHQ-9 Score 10 17 9 9 9   Difficult doing work/chores - - Somewhat difficult Somewhat difficult -  Some recent data might be hidden     Current Outpatient Medications  Medication Instructions   aspirin 81 mg, Oral, Daily, Swallow whole.   BD PEN NEEDLE NANO 2ND GEN 32G X 4 MM MISC SMARTSIG:Injection   busPIRone (BUSPAR) 15 MG tablet TAKE 1 TABLET BY MOUTH 2 TIMES DAILY.   Cholecalciferol (VITAMIN D3 PO) Oral, Daily   Continuous Blood Gluc Sensor (DEXCOM G6 SENSOR) MISC SMARTSIG:Topical Every 10 Days    Desvenlafaxine Succinate ER 25 mg, Oral, Daily   glucagon 1 mg, Intramuscular, Once PRN   levothyroxine (SYNTHROID) 25 mcg, Oral, Daily before breakfast   loratadine (CLARITIN) 10 MG tablet TAKE 1 TABLET BY MOUTH EVERY DAY   MELATONIN PO Oral, As needed   metFORMIN (GLUCOPHAGE) 500 mg, Oral, 2 times daily with meals   NOVOLOG FLEXPEN 100 UNIT/ML FlexPen SMARTSIG:0-90 Unit(s) SUB-Q Daily   omeprazole (PRILOSEC) 20 mg, Oral, Daily   Prenatal Vit-Fe Fumarate-FA (PRENATAL VITAMIN PO) 1 tablet, Oral, Daily   promethazine (PHENERGAN) 25 mg, Oral, Every 6 hours PRN   Tresiba FlexTouch 65 Units, Daily     Review of Systems:   Pertinent items are noted in HPI Denies abnormal vaginal discharge w/ itching/odor/irritation, headaches, visual changes, shortness of breath, chest pain, abdominal pain, severe nausea/vomiting, or problems with urination or bowel movements unless otherwise stated above. Pertinent History Reviewed:  Reviewed past medical,surgical, social, obstetrical and family history.  Reviewed problem list, medications and allergies. Physical Assessment:   Vitals:   01/26/21 1357  BP: 127/80  Pulse: (!) 104  Weight: 204 lb 6.4 oz (92.7 kg)  Body mass index is 37.39 kg/m.           Physical Examination:   General appearance: alert, well appearing, and in no distress  Mental status: alert, oriented to person, place, and time  Skin:  warm & dry   Extremities: Edema: None    Cardiovascular: normal heart rate noted  Respiratory: normal respiratory effort, no distress  Abdomen: gravid, soft, non-tender  Pelvic: Cervical exam deferred         Fetal Status: Fetal Heart Rate (bpm): 130   Movement: Present    Fetal Surveillance Testing today: doppler   Chaperone: N/A    Results for orders placed or performed in visit on 01/26/21 (from the past 24 hour(s))  POC Urinalysis Dipstick OB   Collection Time: 01/26/21  2:01 PM  Result Value Ref Range   Color, UA     Clarity, UA      Glucose, UA Negative Negative   Bilirubin, UA     Ketones, UA neg    Spec Grav, UA     Blood, UA neg    pH, UA     POC,PROTEIN,UA Negative Negative, Trace, Small (1+), Moderate (2+), Large (3+), 4+   Urobilinogen, UA     Nitrite, UA neg    Leukocytes, UA Negative Negative   Appearance     Odor       Assessment & Plan:  High-risk pregnancy: G1P0000 at [redacted]w[redacted]d with an Estimated Date of Delivery: 06/06/21   1) Class C DM -increase novolog 15u + 1:5 carb coverage -continue tresiba 65u daily -metformin 500mg  2 tabs daily -reviewed upcoming ECHO  2) Hypothyroidism -discussed importance of compliance -repeat TSH today  Meds: No orders of the defined types were placed in this encounter.   Labs/procedures today: TSH  Treatment Plan:  continue management with endocrinology and OB  Reviewed: Preterm labor symptoms and general obstetric precautions including but not limited to vaginal bleeding, contractions, leaking of fluid and fetal movement were reviewed in detail with the patient.  All questions were answered. Pt has home bp cuff. Check bp weekly, let know if >140/90.   Follow-up: Return in about 4 weeks (around 02/23/2021) for HROB visit.   Future Appointments  Date Time Provider Department Center  02/03/2021  1:00 PM 04/05/2021, Raliegh Ip [facility] None  02/23/2021  1:50 PM 02/25/2021 Despina Hidden, MD [facility]    Orders Placed This Encounter  Procedures   TSH   POC Urinalysis Dipstick OB    Amaryllis Dyke, DO Attending Obstetrician & Gynecologist, [facility] for [facility]

## 2021-01-27 LAB — TSH: TSH: 3.62 u[IU]/mL (ref 0.450–4.500)

## 2021-01-29 NOTE — BH Specialist Note (Signed)
Integrated Behavioral Health via Telemedicine Visit  01/29/2021 Linda [name] [id number]  Number of Integrated Behavioral Health visits: 1 Session Start time: 2:17  Session End time: 3:15 Total time:  32  Referring Provider: Shawna Clamp, CNM Patient/Family location: [facility] Provider location: [facility]  All persons participating in visit: [name] [name] and [facility] Christobal Morado   Types of Service: Individual psychotherapy and Video visit  I connected with Linda [name] and/or Linda [name]'s  n/a  via  Psychologist, clinical  (Video is Surveyor, mining) and verified that I am speaking with the correct person using two identifiers. Discussed confidentiality: Yes   I discussed the limitations of telemedicine and the availability of in person appointments.  Discussed there is a possibility of technology failure and discussed alternative modes of communication if that failure occurs.  I discussed that engaging in this telemedicine visit, they consent to the provision of behavioral healthcare and the services will be billed under their insurance.  Patient and/or legal guardian expressed understanding and consented to Telemedicine visit: Yes   Presenting Concerns: Patient and/or family reports the following symptoms/concerns: Pt states her primary concern today is ongoing anxiety and depression; feeling anxious with first pregnancy, with fatigue, restlessness, and difficulty falling asleep.  Duration of problem: Ongoing; Increase in current first pregnancy; Severity of problem: moderate  Patient and/or Family's Strengths/Protective Factors: Social connections, Social and Emotional competence, Concrete supports in place (healthy food, safe environments, etc.), and Sense of purpose  Goals Addressed: Patient will:  Reduce symptoms of: anxiety and depression    Increase knowledge and/or ability of: healthy habits and stress reduction   Demonstrate ability to: Increase healthy adjustment to current life circumstances  Progress towards Goals: Ongoing  Interventions: Interventions utilized:  Sleep Hygiene, Psychoeducation and/or Health Education, Link to [employer], and Supportive Reflection Standardized Assessments completed:  Not given today  Patient and/or Family Response: Pt agrees with treatment plan  Assessment: Patient currently experiencing Anxiety disorder, unspecified.   Patient may benefit from psychoeducation and brief therapeutic interventions regarding coping with symptoms of anxiety and depression .  Plan: Follow up with behavioral health clinician on : One month; call Asher Muir at [phone] as needed prior to scheduled visit Behavioral recommendations:  -Continue taking all medications as prescribed by medical provider -Continue using daily self-coping strategies to help manage emotions(music, singing, drawing, gaming, knitting, comedy) -View video tour of [facility] and register for childbirth class of choice at [email or web address]  -Read through Postpartum Planner (on After Visit Summary) -Discuss with medical provider on 02/23/21 about using maternity belt during day to reduce back/hip pain, to help improve sleep at night  Referral(s): Integrated [employer] (In Clinic)  I discussed the assessment and treatment plan with the patient and/or parent/guardian. They were provided an opportunity to ask questions and all were answered. They agreed with the plan and demonstrated an understanding of the instructions.   They were advised to call back or seek an in-person evaluation if the symptoms worsen or if the condition fails to improve as anticipated.  Rae Lips, LCSW   Depression screen [facility] 2/9 12/10/2020 10/15/2020 07/10/2020 07/08/2020 05/27/2020  Decreased Interest 2 2 1 1 1   Down,  Depressed, Hopeless 0 2 1 1 1   PHQ - 2 Score 2 4 2 2 2   Altered sleeping 3 3 1 1 1   Tired, decreased energy 3 3 3 3 2   Change  in appetite 1 1 0 0 1  Feeling bad or failure about yourself  0 1 0 0 1  Trouble concentrating 1 3 3 3 2   Moving slowly or fidgety/restless 0 2 0 0 0  Suicidal thoughts 0 0 0 0 0  PHQ-9 Score 10 17 9 9 9   Difficult doing work/chores - - Somewhat difficult Somewhat difficult -  Some recent data might be hidden   GAD 7 : Generalized Anxiety Score 12/10/2020 10/15/2020 07/08/2020 05/27/2020  Nervous, Anxious, on Edge 1 3 1 2   Control/stop worrying 1 3 1 1   Worry too much - different things 1 3 1 1   Trouble relaxing 2 3 0 2  Restless 3 3 2 2   Easily annoyed or irritable 2 3 1 2   Afraid - awful might happen 1 3 1 1   Total GAD 7 Score 11 21 7 11   Anxiety Difficulty - - Somewhat difficult -

## 2021-02-03 ENCOUNTER — Ambulatory Visit (INDEPENDENT_AMBULATORY_CARE_PROVIDER_SITE_OTHER): Payer: Medicaid Other | Admitting: Family Medicine

## 2021-02-03 ENCOUNTER — Other Ambulatory Visit: Payer: Self-pay

## 2021-02-03 ENCOUNTER — Encounter: Payer: Self-pay | Admitting: Family Medicine

## 2021-02-03 VITALS — BP 124/78 | HR 119 | Temp 97.9°F | Ht 62.0 in | Wt 205.0 lb

## 2021-02-03 DIAGNOSIS — E1065 Type 1 diabetes mellitus with hyperglycemia: Secondary | ICD-10-CM

## 2021-02-03 DIAGNOSIS — F329 Major depressive disorder, single episode, unspecified: Secondary | ICD-10-CM | POA: Diagnosis not present

## 2021-02-03 DIAGNOSIS — F411 Generalized anxiety disorder: Secondary | ICD-10-CM | POA: Diagnosis not present

## 2021-02-03 DIAGNOSIS — Z3492 Encounter for supervision of normal pregnancy, unspecified, second trimester: Secondary | ICD-10-CM

## 2021-02-03 DIAGNOSIS — F32A Depression, unspecified: Secondary | ICD-10-CM

## 2021-02-03 MED ORDER — BUSPIRONE HCL 15 MG PO TABS: 15.0000 mg | ORAL_TABLET | Freq: Two times a day (BID) | ORAL | 1 refills | Status: DC

## 2021-02-03 MED ORDER — DESVENLAFAXINE SUCCINATE ER 25 MG PO TB24: 25.0000 mg | ORAL_TABLET | Freq: Every day | ORAL | 1 refills | Status: DC

## 2021-02-03 NOTE — Progress Notes (Signed)
Subjective: CC: depression/ anxiety PCP: Raliegh Ip, DO [id number] Linda [name] is a 20 y.o. female presenting to clinic today for:  1. Depression/ anxiety, DM Patient reports good control of symptoms.  She is getting excited about the birth of her daughter in December.  Pregnancy overall seems to be doing well but she admits to some labile BGs.  Working closely with Endo/ OB/GYN.  No nausea. Compliant with all meds for mood, DM, and now thyroid.  OB/GYN gave the go ahead to continue Buspar and Pristiq.  She just purchased a home with her fiance and she is looking forward to this as well.     ROS: Per HPI  No Known Allergies Past Medical History:  Diagnosis Date   Allergy    Anxiety    Asthma    Carpal tunnel syndrome    Depression    Diabetes mellitus without complication (HCC) 2014   Type 1   Tachycardia    Tinnitus     Current Outpatient Medications:    aspirin 81 MG EC tablet, Take 1 tablet (81 mg total) by mouth daily. Swallow whole., Disp: 90 tablet, Rfl: 3   BD PEN NEEDLE NANO 2ND GEN 32G X 4 MM MISC, SMARTSIG:Injection, Disp: , Rfl:    busPIRone (BUSPAR) 15 MG tablet, TAKE 1 TABLET BY MOUTH 2 TIMES DAILY., Disp: 60 tablet, Rfl: 0   Cholecalciferol (VITAMIN D3 PO), Take by mouth daily., Disp: , Rfl:    Continuous Blood Gluc Sensor (DEXCOM G6 SENSOR) MISC, SMARTSIG:Topical Every 10 Days, Disp: , Rfl:    Desvenlafaxine Succinate ER 25 MG TB24, Take 25 mg by mouth daily., Disp: 90 tablet, Rfl: 0   glucagon 1 MG injection, Inject 1 mg into the muscle once as needed (for blood sugar)., Disp: , Rfl:    insulin degludec (TRESIBA FLEXTOUCH) 100 UNIT/ML FlexTouch Pen, 65 Units daily., Disp: , Rfl:    levothyroxine (SYNTHROID) 25 MCG tablet, Take 1 tablet (25 mcg total) by mouth daily before breakfast., Disp: 90 tablet, Rfl: 3   loratadine (CLARITIN) 10 MG tablet, TAKE 1 TABLET BY MOUTH EVERY DAY, Disp: 30 tablet, Rfl: 11   MELATONIN PO, Take by mouth as needed., Disp:  , Rfl:    metFORMIN (GLUCOPHAGE) 500 MG tablet, Take 500 mg by mouth 2 (two) times daily with a meal., Disp: , Rfl:    NOVOLOG FLEXPEN 100 UNIT/ML FlexPen, SMARTSIG:0-90 Unit(s) SUB-Q Daily, Disp: , Rfl:    omeprazole (PRILOSEC) 20 MG capsule, Take 1 capsule (20 mg total) by mouth daily., Disp: 7 capsule, Rfl: 0   Prenatal Vit-Fe Fumarate-FA (PRENATAL VITAMIN PO), Take 1 tablet by mouth daily., Disp: , Rfl:    promethazine (PHENERGAN) 25 MG tablet, Take 1 tablet (25 mg total) by mouth every 6 (six) hours as needed for nausea or vomiting., Disp: 30 tablet, Rfl: 1 Social History   Socioeconomic History   Marital status: Media planner    Spouse name: Not on file   Number of children: Not on file   Years of education: Not on file   Highest education level: Not on file  Occupational History   Not on file  Tobacco Use   Smoking status: Never    Passive exposure: Yes   Smokeless tobacco: Never  Vaping Use   Vaping Use: Former  Substance and Sexual Activity   Alcohol use: No   Drug use: No   Sexual activity: Yes    Birth control/protection: None  Other Topics Concern  Not on file  Social History Narrative   Not on file   Social Determinants of Health   Financial Resource Strain: Low Risk    Difficulty of Paying Living Expenses: Not hard at all  Food Insecurity: No Food Insecurity   Worried About Programme researcher, broadcasting/film/video in the Last Year: Never true   Barista in the Last Year: Never true  Transportation Needs: No Transportation Needs   Lack of Transportation (Medical): No   Lack of Transportation (Non-Medical): No  Physical Activity: Inactive   Days of Exercise per Week: 0 days   Minutes of Exercise per Session: 0 min  Stress: Stress Concern Present   Feeling of Stress : Very much  Social Connections: Moderately Isolated   Frequency of Communication with Friends and Family: More than three times a week   Frequency of Social Gatherings with Friends and Family: More than  three times a week   Attends Religious Services: Never   Database administrator or Organizations: No   Attends Engineer, structural: Never   Marital Status: Living with partner  Intimate Partner Violence: Not At Risk   Fear of Current or Ex-Partner: No   Emotionally Abused: No   Physically Abused: No   Sexually Abused: No   Family History  Problem Relation Age of Onset   Hypertension Mother    Thyroid disease Mother    Asthma Brother    Thyroid disease Brother    COPD Paternal Grandmother    Hypertension Paternal Grandmother    COPD Maternal Grandmother    Lumbar disc disease Maternal Grandmother    Dementia Maternal Grandmother    Heart attack Maternal Grandfather    Emphysema Maternal Grandfather    Ankylosing spondylitis Maternal Grandfather    Dementia Maternal Grandfather     Objective: Office vital signs reviewed. BP 124/78   Pulse (!) 119   Temp 97.9 F (36.6 C)   Ht 5\' 2"  (1.575 m)   Wt 205 lb (93 kg)   LMP 08/30/2020   SpO2 97%   BMI 37.49 kg/m   Physical Examination:  General: Awake, alert, well nourished, No acute distress HEENT: Normal, sclera white, MMM Cardio: regular rate and rhythm, S1S2 heard, no murmurs appreciated Pulm: clear to auscultation bilaterally, no wheezes, rhonchi or rales; normal work of breathing on room air GU: gravid uterus Extremities: warm, well perfused, No edema, cyanosis or clubbing; +2 pulses bilaterally Neuro: see DM foot  Diabetic Foot Exam - Simple   Simple Foot Form Diabetic Foot exam was performed with the following findings: Yes 02/03/2021  2:51 PM  Visual Inspection No deformities, no ulcerations, no other skin breakdown bilaterally: Yes Sensation Testing Intact to touch and monofilament testing bilaterally: Yes Pulse Check Posterior Tibialis and Dorsalis pulse intact bilaterally: Yes Comments    Depression screen [facility] 2/9 02/03/2021 12/10/2020 10/15/2020  Decreased Interest 1 2 2   Down, Depressed, Hopeless  0 0 2  PHQ - 2 Score 1 2 4   Altered sleeping 2 3 3   Tired, decreased energy 3 3 3   Change in appetite 0 1 1  Feeling bad or failure about yourself  0 0 1  Trouble concentrating 1 1 3   Moving slowly or fidgety/restless 0 0 2  Suicidal thoughts 0 0 0  PHQ-9 Score 7 10 17   Difficult doing work/chores Somewhat difficult - -  Some recent data might be hidden   GAD 7 : Generalized Anxiety Score 02/03/2021 12/10/2020 10/15/2020 07/08/2020  Nervous, Anxious, on Edge 1 1 3 1   Control/stop worrying 1 1 3 1   Worry too much - different things 1 1 3 1   Trouble relaxing 2 2 3  0  Restless 0 3 3 2   Easily annoyed or irritable 3 2 3 1   Afraid - awful might happen 1 1 3 1   Total GAD 7 Score 9 11 21 7   Anxiety Difficulty Somewhat difficult - - Somewhat difficult      Assessment/ Plan: 20 y.o. female   Generalized anxiety disorder - Plan: busPIRone (BUSPAR) 15 MG tablet, Desvenlafaxine Succinate ER 25 MG TB24  Depressive disorder - Plan: busPIRone (BUSPAR) 15 MG tablet, Desvenlafaxine Succinate ER 25 MG TB24  Second trimester pregnancy  Type 1 diabetes mellitus with hyperglycemia (HCC)  GAD7 and PHQ9 scores still elevated but improving.  We discussed consideration for adjustment of Pristiq but she wants to continue current course for now.  Plan for reassessment postpartum, sooner if concerns arise.  DM foot exam performed today.  She had urine micro already with Endo.  This has been printed to be scanned into our EMR.  No orders of the defined types were placed in this encounter.  No orders of the defined types were placed in this encounter.    , DO [facility] [phone]

## 2021-02-03 NOTE — Patient Instructions (Signed)
Meds have been reviewed.  Plan to see me after you've had [name] for postpartum checkup.  Please don't hesitate to reach out to me beforehand if concerns arise.  Congrats on the new home!

## 2021-02-04 ENCOUNTER — Ambulatory Visit (INDEPENDENT_AMBULATORY_CARE_PROVIDER_SITE_OTHER): Payer: Medicaid Other | Admitting: Clinical

## 2021-02-04 DIAGNOSIS — F419 Anxiety disorder, unspecified: Secondary | ICD-10-CM | POA: Diagnosis not present

## 2021-02-04 NOTE — Patient Instructions (Signed)
[facility] [place], [place] [address] [phone] (main office) [phone] [facility] office)  [email or web address] (view tour of [facility]; register for childbirth classes)     BRAINSTORMING  Develop a Plan Goals: Provide a way to start conversation about your new life with a baby Assist parents in recognizing and using resources within their reach Help pave the way before birth for an easier period of transition afterwards.  Make a list of the following information to keep in a central location: Full name of Mom and Partner: _____________________________________________ 44 full name and Date of Birth: ___________________________________________ Home Address: ___________________________________________________________ ________________________________________________________________________ Home Phone: ____________________________________________________________ Parents' cell numbers: _____________________________________________________ ________________________________________________________________________ Name and contact info for OB: ______________________________________________ Name and contact info for Pediatrician:________________________________________ Contact info for Lactation Consultants: ________________________________________  REST and SLEEP *You each need at least 4-5 hours of uninterrupted sleep every day. Write specific names and contact information.* How are you going to rest in the postpartum period? While partner's home? When partner returns to work? When you both return to work? Where will your baby sleep? Who is available to help during the day? Evening? Night? Who could move in for a period to help support you? What are some ideas to help you get enough  sleep? __________________________________________________________________________________________________________________________________________________________________________________________________________________________________________ NUTRITIOUS FOOD AND DRINK *Plan for meals before your baby is born so you can have healthy food to eat during the immediate postpartum period.* Who will look after breakfast? Lunch? Dinner? List names and contact information. Brainstorm quick, healthy ideas for each meal. What can you do before baby is born to prepare meals for the postpartum period? How can others help you with meals? Which grocery stores provide online shopping and delivery? Which restaurants offer take-out or delivery options? ______________________________________________________________________________________________________________________________________________________________________________________________________________________________________________________________________________________________________________________________________________________________________________________________________  CARE FOR MOM *It's important that mom is cared for and pampered in the postpartum period. Remember, the most important ways new mothers need care are: sleep, nutrition, gentle exercise, and time off.* Who can come take care of mom during this period? Make a list of people with their contact information. List some activities that make you feel cared for, rested, and energized? Who can make sure you have opportunities to do these things? Does mom have a space of her very own within your home that's just for her? Make a "[facility] [place]" where she can be comfortable, rest, and renew herself  daily. ______________________________________________________________________________________________________________________________________________________________________________________________________________________________________________________________________________________________________________________________________________________________________________________________________    CARE FOR AND FEEDING BABY *Knowledgeable and encouraging people will offer the best support with regard to feeding your baby.* Educate yourself and choose the best feeding option for your baby. Make a list of people who will guide, support, and be a resource for you as your care for and feed your baby. (Friends that have breastfed or are currently breastfeeding, lactation consultants, breastfeeding support groups, etc.) Consider a postpartum doula. (These websites can give you information: dona.org & BuyingShow.es) Seek out local breastfeeding resources like the breastfeeding support group at [employer] or [employer]. ______________________________________________________________________________________________________________________________________________________________________________________________________________________________________________________________________________________________________________________________________________________________________________________________________  Verner Chol AND ERRANDS Who can help with a thorough cleaning before baby is born? Make a list of people who will help with housekeeping and chores, like laundry, light cleaning, dishes, bathrooms, etc. Who can run some errands for you? What can you do to make sure you are stocked with basic supplies before baby is born? Who is going to do the  shopping? ______________________________________________________________________________________________________________________________________________________________________________________________________________________________________________________________________________________________________________________________________________________________________________________________________     Family Adjustment *Nurture yourselves.it helps parents be more loving and allows for better bonding with  their child.* What sorts of things do you and partner enjoy doing together? Which activities help you to connect and strengthen your relationship? Make a list of those things. Make a list of people whom you trust to care for your baby so you can have some time together as a couple. What types of things help partner feel connected to Mom? Make a list. What needs will partner have in order to bond with baby? Other children? Who will care for them when you go into labor and while you are in the hospital? Think about what the needs of your older children might be. Who can help you meet those needs? In what ways are you helping them prepare for bringing baby home? List some specific strategies you have for family adjustment. _______________________________________________________________________________________________________________________________________________________________________________________________________________________________________________________________________________________________________________________________________________  SUPPORT *Someone who can empathize with experiences normalizes your problems and makes them more bearable.* Make a list of other friends, neighbors, and/or co-workers you know with infants (and small children, if applicable) with whom you can connect. Make a list of local or online support groups, mom groups, etc. in which you can be  involved. ______________________________________________________________________________________________________________________________________________________________________________________________________________________________________________________________________________________________________________________________________________________________________________________________________  Childcare Plans Investigate and plan for childcare if mom is returning to work. Talk about mom's concerns about her transition back to work. Talk about partner's concerns regarding this transition.  Mental Health *Your mental health is one of the highest priorities for a pregnant or postpartum mom.* 1 in 5 women experience anxiety and/or depression from the time of conception through the first year after birth. Postpartum Mood Disorders are the #1 complication of pregnancy and childbirth and the suffering experienced by these mothers is not necessary! These illnesses are temporary and respond well to treatment, which often includes self-care, social support, talk therapy, and medication when needed. Women experiencing anxiety and depression often say things like: "I'm supposed to be happy.why do I feel so sad?", "Why can't I snap out of it?", "I'm having thoughts that scare me." There is no need to be embarrassed if you are feeling these symptoms: Overwhelmed, anxious, angry, sad, guilty, irritable, hopeless, exhausted but can't sleep You are NOT alone. You are NOT to blame. With help, you WILL be well. Where can I find help? Medical professionals such as your OB, midwife, gynecologist, family practitioner, primary care provider, pediatrician, or mental health providers; [facility] support groups: Feelings After Birth, Breastfeeding Support Group, Baby and Me Group, and Fit 4 Two exercise classes. You have permission to ask for help. It will confirm your feelings, validate your experiences,  share/learn coping strategies, and gain support and encouragement as you heal. You are important! BRAINSTORM Make a list of local resources, including resources for mom and for partner. Identify support groups. Identify people to call late at night - include names and contact info. Talk with partner about perinatal mood and anxiety disorders. Talk with your OB, midwife, and doula about baby blues and about perinatal mood and anxiety disorders. Talk with your pediatrician about perinatal mood and anxiety disorders.   Support & Sanity Savers   What do you really need?  Basics In preparing for a new baby, many expectant parents spend hours shopping for baby clothes, decorating the nursery, and deciding which car seat to buy. Yet most don't think much about what the reality of parenting a newborn will be like, and what they need to make it through that. So, here is the advice of experienced parents. We know you'll read this, and think "they're exaggerating, I don't really need  that." Just trust [place] on these, OK? Plan for all of this, and if it turns out you don't need it, come back and teach [place] how you did it!  Must-Haves (Once baby's survival needs are met, make sure you attend to your own survival needs!) Sleep An average newborn sleeps 16-18 hours per day, over 6-7 sleep periods, rarely more than three hours at a time. It is normal and healthy for a newborn to wake throughout the night... but really hard on parents!! Naps. Prioritize sleep above any responsibilities like: cleaning house, visiting friends, running errands, etc.  Sleep whenever baby sleeps. If you can't nap, at least have restful times when baby eats. The more rest you get, the more patient you will be, the more emotionally stable, and better at solving problems.  Food You may not have realized it would be difficult to eat when you have a newborn. Yet, when we talk to countless new parents, they say things like "it may be 2:00 pm  when I realize I haven't had breakfast yet." Or "every time we sit down to dinner, baby needs to eat, and my food gets cold, so I don't bother to eat it." Finger food. Before your baby is born, stock up with one months' worth of food that: 1) you can eat with one hand while holding a baby, 2) doesn't need to be prepped, 3) is good hot or cold, 4) doesn't spoil when left out for a few hours, and 5) you like to eat. Think about: nuts, dried fruit, Clif bars, pretzels, jerky, gogurt, baby carrots, apples, bananas, crackers, cheez-n-crackers, string cheese, hot pockets or frozen burritos to microwave, garden burgers and breakfast pastries to put in the toaster, yogurt drinks, etc. Restaurant Menus. Make lists of your favorite restaurants & menu items. When family/friends want to help, you can give specific information without much thought. They can either bring you the food or send gift cards for just the right meals. Freezer Meals.  Take some time to make a few meals to put in the freezer ahead of time.  Easy to freeze meals can be anything such as soup, lasagna, chicken pie, or spaghetti sauce. Set up a Meal Schedule.  Ask friends and family to sign up to bring you meals during the first few weeks of being home. (It can be passed around at baby showers!) You have no idea how helpful this will be until you are in the throes of parenting.  https://hamilton-woodard.com/ is a great website to check out. Emotional Support Know who to call when you're stressed out. Parenting a newborn is very challenging work. There are times when it totally overwhelms your normal coping abilities. EVERY NEW PARENT NEEDS TO HAVE A PLAN FOR WHO TO CALL WHEN THEY JUST CAN'T COPE ANY MORE. (And it has to be someone other than the baby's other parent!) Before your baby is born, come up with at least one person you can call for support - write their phone number down and post it on the refrigerator. Anxiety & Sadness. Baby blues are normal after  pregnancy; however, there are more severe types of anxiety & sadness which can occur and should not be ignored.  They are always treatable, but you have to take the first step by reaching out for help. [facility] offers a "Mom Talk" group which meets every Tuesday from 10 am - 11 am.  This group is for new moms who need support and connection after their babies are  born.  Call [phone].  Really, Really Helpful (Plan for them! Make sure these happen often!!) Physical Support with Taking Care of Yourselves Asking friends and family. Before your baby is born, set up a schedule of people who can come and visit and help out (or ask a friend to schedule for you). Any time someone says "let me know what I can do to help," sign them up for a day. When they get there, their job is not to take care of the baby (that's your job and your joy). Their job is to take care of you!  Postpartum doulas. If you don't have anyone you can call on for support, look into postpartum doulas:  professionals at helping parents with caring for baby, caring for themselves, getting breastfeeding started, and helping with household tasks. www.padanc.org is a helpful website for learning about doulas in our area. Peer Support / Parent Groups Why: One of the greatest ideas for new parents is to be around other new parents. Parent groups give you a chance to share and listen to others who are going through the same season of life, get a sense of what is normal infant development by watching several babies learn and grow, share your stories of triumph and struggles with empathetic ears, and forgive your own mistakes when you realize all parents are learning by trial and error. Where to find: There are many places you can meet other new parents throughout our community.  [facility] offers the following classes for new moms and their little ones:  Baby and Me (Birth to Outlook) and Breastfeeding Support Group. Go to  www.[email or web address] or call [phone] for more information. Time for your Relationship It's easy to get so caught up in meeting baby's immediate needs that it's hard to find time to connect with your partner, and meet the needs of your relationship. It's also easy to forget what "quality time with your partner" actually looks like. If you take your baby on a date, you'd be amazed how much of your couple time is spent feeding the baby, diapering the baby, admiring the baby, and talking about the baby. Dating: Try to take time for just the two of you. Babysitter tip: Sometimes when moms are breastfeeding a newborn, they find it hard to figure out how to schedule outings around baby's unpredictable feeding schedules. Have the babysitter come for a three hour period. When she comes over, if baby has just eaten, you can leave right away, and come back in two hours. If baby hasn't fed recently, you start the date at home. Once baby gets hungry and gets a good feeding in, you can head out for the rest of your date time. Date Nights at Home: If you can't get out, at least set aside one evening a week to prioritize your relationship: whenever baby dozes off or doesn't have any immediate needs, spend a little time focusing on each other. Potential conflicts: The main relationship conflicts that come up for new parents are: issues related to sexuality, financial stresses, a feeling of an unfair division of household tasks, and conflicts in parenting styles. The more you can work on these issues before baby arrives, the better!  Fun and Frills (Don't forget these. and don't feel guilty for indulging in them!) Everyone has something in life that is a fun little treat that they do just for themselves. It may be: reading the morning paper, or going for a daily jog, or having coffee with a friend  once a week, or going to a movie on Friday nights, or fine chocolates, or bubble baths, or curling up with a good  book. Unless you do fun things for yourself every now and then, it's hard to have the energy for fun with your baby. Whatever your "special" treats are, make sure you find a way to continue to indulge in them after your baby is born. These special moments can recharge you, and allow you to return to baby with a new joy   PERINATAL MOOD DISORDERS: [place]   Emergency and Crisis Resources:  If you are an imminent risk to self or others, are experiencing intense personal distress, and/or have noticed significant changes in activities of daily living, call:  [facility]: [place]: [place]: [phone] Or visit the following crisis centers: Local Emergency Departments [facility]: [address], [place]. Hours: 8:30AM-5PM. Insurance Accepted: Medicaid, Medicare, and Uninsured.  [facility]  [address], [place] Mon-Friday 8am-3pm  [phone]                                                                                    Non-Crisis Resources: To identify specific providers that are covered by your insurance, contact your insurance company or local agencies: [place]: [phone] [facility] and [place]: [phone] [place]: [phone] Postpartum Support International- [place] [phone]                                                      Outpatient therapy and medication management providers:  [facility] [phone] Hours: 9AM-5PM  Insurance Accepted: Alben Spittle, Lorella Nimrod, Freddrick March, [place], Medicare  [facility] ([place]) [phone] Hours: 8AM-5PM  nsurance Accepted: All insurances EXCEPT AARP, [place], [place], and [place]: [phone]             Hours: 8AM-8PM Insurance  Accepted: Cristal Ford, Freddrick March, [place], Medicare, [place] [phone] [facility]: [phone] Hours: 8:30AM-7PM Insurance Accepted: Cristal Ford, Medicaid, Medicare, Tricare, [employer] Counseling:  [place] Accepted:  Holland Falling, Lorella Nimrod, [employer], [place], [facility] [phone] Hours: 9AM-5:30PM Insurance Accepted: Alben Spittle, Charlotte Crumb, and Medicaid, Medicare, [employer] [facility] Counseling:  [phone] Hours: 9am-5pm Insurance Accepted: BCBS; they do not accept Medicaid/Medicare [place]: [phone] Hours: 9am-9pm Insurance Accepted: All major insurance including Medicaid and Medicare [facility]: [phone] Hours: 9AM-5:30PM Insurance Accepted: All insurances EXCEPT Medicaid and Medicare. [facility]: [phone]  Parenting [place]: [phone] [facility]:  [place] (support for children in the NICU and/or with special needs), [employer]: [phone]                                                                                     Online Resources: Postpartum Support International: http://jones-berg.com/  800-944-4PPD 2Moms Supporting Moms:  www.momssupportingmoms.net

## 2021-02-05 ENCOUNTER — Telehealth: Payer: Self-pay | Admitting: Lactation Services

## 2021-02-05 NOTE — Telephone Encounter (Signed)
Called and spoke with patient as she voiced concerns with BF her infant at request of Hulda Marin, Behavioral Health Specialist.    Patient reports she has been told she can and cannot breast feed due to condition.   Reviewed with mom that all the medications she is on is contraindicated in breast feeding. Phone number to Infant Risk at [phone] to clarify more information on the medications.   Reviewed EBM leaking with pregnancy is normal and to wear pads as needed. Reviewed not trying to express milk this early in pregnancy. Patient voiced understanding.   Reviewed BF classes on [email or web address], call WIC in [facility] to sign up for [facility], BF services in the hospital and post partum.   All patients questions answered. Patient to call with any questions or concerns as needed.

## 2021-02-14 ENCOUNTER — Inpatient Hospital Stay (HOSPITAL_COMMUNITY)
Admission: AD | Admit: 2021-02-14 | Discharge: 2021-02-15 | Disposition: A | Discharge status: Home / Self Care | Payer: Medicaid Other | Attending: Obstetrics & Gynecology | Admitting: Obstetrics & Gynecology

## 2021-02-14 ENCOUNTER — Other Ambulatory Visit: Payer: Self-pay

## 2021-02-14 DIAGNOSIS — Z7982 Long term (current) use of aspirin: Secondary | ICD-10-CM | POA: Insufficient documentation

## 2021-02-14 DIAGNOSIS — Z79899 Other long term (current) drug therapy: Secondary | ICD-10-CM | POA: Insufficient documentation

## 2021-02-14 DIAGNOSIS — Z3A24 24 weeks gestation of pregnancy: Secondary | ICD-10-CM | POA: Insufficient documentation

## 2021-02-14 DIAGNOSIS — Z0371 Encounter for suspected problem with amniotic cavity and membrane ruled out: Secondary | ICD-10-CM

## 2021-02-14 DIAGNOSIS — Z3689 Encounter for other specified antenatal screening: Secondary | ICD-10-CM

## 2021-02-14 DIAGNOSIS — Z794 Long term (current) use of insulin: Secondary | ICD-10-CM | POA: Insufficient documentation

## 2021-02-14 DIAGNOSIS — Z8249 Family history of ischemic heart disease and other diseases of the circulatory system: Secondary | ICD-10-CM | POA: Insufficient documentation

## 2021-02-14 DIAGNOSIS — Z7722 Contact with and (suspected) exposure to environmental tobacco smoke (acute) (chronic): Secondary | ICD-10-CM | POA: Insufficient documentation

## 2021-02-14 DIAGNOSIS — O42912 Preterm premature rupture of membranes, unspecified as to length of time between rupture and onset of labor, second trimester: Secondary | ICD-10-CM | POA: Insufficient documentation

## 2021-02-15 ENCOUNTER — Encounter (HOSPITAL_COMMUNITY): Payer: Self-pay | Admitting: Obstetrics & Gynecology

## 2021-02-15 ENCOUNTER — Encounter: Payer: Self-pay | Admitting: Nurse Practitioner

## 2021-02-15 DIAGNOSIS — Z8249 Family history of ischemic heart disease and other diseases of the circulatory system: Secondary | ICD-10-CM | POA: Diagnosis not present

## 2021-02-15 DIAGNOSIS — Z3A24 24 weeks gestation of pregnancy: Secondary | ICD-10-CM | POA: Diagnosis not present

## 2021-02-15 DIAGNOSIS — Z7722 Contact with and (suspected) exposure to environmental tobacco smoke (acute) (chronic): Secondary | ICD-10-CM | POA: Diagnosis not present

## 2021-02-15 DIAGNOSIS — Z7982 Long term (current) use of aspirin: Secondary | ICD-10-CM | POA: Diagnosis not present

## 2021-02-15 DIAGNOSIS — O42912 Preterm premature rupture of membranes, unspecified as to length of time between rupture and onset of labor, second trimester: Secondary | ICD-10-CM | POA: Diagnosis not present

## 2021-02-15 DIAGNOSIS — Z0371 Encounter for suspected problem with amniotic cavity and membrane ruled out: Secondary | ICD-10-CM | POA: Diagnosis not present

## 2021-02-15 DIAGNOSIS — Z794 Long term (current) use of insulin: Secondary | ICD-10-CM | POA: Diagnosis not present

## 2021-02-15 DIAGNOSIS — Z79899 Other long term (current) drug therapy: Secondary | ICD-10-CM | POA: Diagnosis not present

## 2021-02-15 DIAGNOSIS — Z3689 Encounter for other specified antenatal screening: Secondary | ICD-10-CM | POA: Diagnosis not present

## 2021-02-15 LAB — URINALYSIS, ROUTINE W REFLEX MICROSCOPIC
Bilirubin Urine: NEGATIVE
Glucose, UA: NEGATIVE mg/dL
Hgb urine dipstick: NEGATIVE
Ketones, ur: 80 mg/dL — AB
Leukocytes,Ua: NEGATIVE
Nitrite: NEGATIVE
Protein, ur: NEGATIVE mg/dL
Specific Gravity, Urine: 1.019 (ref 1.005–1.030)
pH: 6 (ref 5.0–8.0)

## 2021-02-15 LAB — WET PREP, GENITAL
Clue Cells Wet Prep HPF POC: NONE SEEN
Sperm: NONE SEEN
Trich, Wet Prep: NONE SEEN
Yeast Wet Prep HPF POC: NONE SEEN

## 2021-02-15 NOTE — MAU Note (Signed)
..  Linda [name] is a 20 y.o. at [redacted]w[redacted]d here in MAU reporting: Thursday she noticed her perineum was wet and it was a different color and smell as her urine. She has been wet ever since, denies gushes of fluid. Denies vaginal bleeding or contractions. +FM. Reports her lower abdomen is sore.  Pain score: 1/10 Vitals:   02/15/21 0017  BP: 125/83  Pulse: 95  Resp: 18  Temp: 98.6 F (37 C)  SpO2: 100%     FHT: 148 Lab orders placed from triage: UA

## 2021-02-15 NOTE — MAU Provider Note (Signed)
History     CSN: [id number]  Arrival date and time: 02/14/21 2333   Event Date/Time   First Provider Initiated Contact with Patient 02/15/21 0202      Chief Complaint  Patient presents with   Rupture of Membranes   Linda [name] is a 20 y.o. G1P0 at [redacted]w[redacted]d who receives care at [facility].  She presents today for Rupture of Membranes.  She states she noticed increased discharge vs leaking on Thursday around [place].  She states she called office and was told to put on a pad. She states the pad had no fluid, but she noticed some today.  She states she is having no abdominal cramping or contractions, but is having some soreness in her legs.  She denies issues with urination, but is unsure if the leaking is fluid or urine.  She denies vaginal bleeding or abnormal discharge prior to the leaking. She reports sexual activity yesterday without pain, but discomfort with deep penetration. Patient endorses fetal movement.  Patient appears to be very anxious.    OB History     Gravida  1   Para  0   Term  0   Preterm  0   AB  0   Living  0      SAB  0   IAB  0   Ectopic  0   Multiple  0   Live Births  0           Past Medical History:  Diagnosis Date   Allergy    Anxiety    Asthma    Carpal tunnel syndrome    Depression    Diabetes mellitus without complication (HCC) 2014   Type 1   Tachycardia    Tinnitus     Past Surgical History:  Procedure Laterality Date   ADENOIDECTOMY     TONSILLECTOMY     WISDOM TOOTH EXTRACTION      Family History  Problem Relation Age of Onset   Hypertension Mother    Thyroid disease Mother    Asthma Brother    Thyroid disease Brother    COPD Paternal Grandmother    Hypertension Paternal Grandmother    COPD Maternal Grandmother    Lumbar disc disease Maternal Grandmother    Dementia Maternal Grandmother    Heart attack Maternal Grandfather    Emphysema Maternal Grandfather    Ankylosing spondylitis Maternal Grandfather     Dementia Maternal Grandfather     Social History   Tobacco Use   Smoking status: Never    Passive exposure: Yes   Smokeless tobacco: Never  Vaping Use   Vaping Use: Former  Substance Use Topics   Alcohol use: No   Drug use: No    Allergies: No Known Allergies  Medications Prior to Admission  Medication Sig Dispense Refill Last Dose   aspirin 81 MG EC tablet Take 1 tablet (81 mg total) by mouth daily. Swallow whole. 90 tablet 3 02/14/2021   busPIRone (BUSPAR) 15 MG tablet Take 1 tablet (15 mg total) by mouth 2 (two) times daily. 180 tablet 1 02/14/2021   Cholecalciferol (VITAMIN D3 PO) Take by mouth daily.   02/14/2021   Continuous Blood Gluc Sensor (DEXCOM G6 SENSOR) MISC SMARTSIG:Topical Every 10 Days   02/15/2021   Desvenlafaxine Succinate ER 25 MG TB24 Take 25 mg by mouth daily. 90 tablet 1 02/14/2021   glucagon 1 MG injection Inject 1 mg into the muscle once as needed (for blood sugar).  02/14/2021   insulin degludec (TRESIBA FLEXTOUCH) 100 UNIT/ML FlexTouch Pen 65 Units daily.   02/14/2021   levothyroxine (SYNTHROID) 25 MCG tablet Take 1 tablet (25 mcg total) by mouth daily before breakfast. 90 tablet 3 02/14/2021   loratadine (CLARITIN) 10 MG tablet TAKE 1 TABLET BY MOUTH EVERY DAY 30 tablet 11 02/14/2021   MELATONIN PO Take by mouth as needed.   Past Week   metFORMIN (GLUCOPHAGE) 500 MG tablet Take 500 mg by mouth 2 (two) times daily with a meal.   02/14/2021   NOVOLOG FLEXPEN 100 UNIT/ML FlexPen SMARTSIG:0-90 Unit(s) SUB-Q Daily   02/14/2021   omeprazole (PRILOSEC) 20 MG capsule Take 1 capsule (20 mg total) by mouth daily. 7 capsule 0 02/14/2021   Prenatal Vit-Fe Fumarate-FA (PRENATAL VITAMIN PO) Take 1 tablet by mouth daily.   02/14/2021   BD PEN NEEDLE NANO 2ND GEN 32G X 4 MM MISC SMARTSIG:Injection      promethazine (PHENERGAN) 25 MG tablet Take 1 tablet (25 mg total) by mouth every 6 (six) hours as needed for nausea or vomiting. 30 tablet 1 More than a month    Review of Systems   Gastrointestinal:  Negative for abdominal pain, constipation and diarrhea.  Genitourinary:  Negative for difficulty urinating, vaginal bleeding and vaginal discharge.  Musculoskeletal:  Negative for back pain.  Neurological:  Negative for headaches.  Physical Exam   Blood pressure 125/83, pulse 95, temperature 98.6 F (37 C), temperature source Oral, resp. rate 18, height 5\' 2"  (1.575 m), weight 92.4 kg, last menstrual period 08/30/2020, SpO2 100 %.  Physical Exam Vitals reviewed. Exam conducted with a chaperone present.  Constitutional:      Appearance: Normal appearance.  HENT:     Head: Normocephalic and atraumatic.  Eyes:     Conjunctiva/sclera: Conjunctivae normal.  Cardiovascular:     Rate and Rhythm: Normal rate.  Pulmonary:     Effort: Pulmonary effort is normal. No respiratory distress.  Abdominal:     Tenderness: There is no abdominal tenderness.  Genitourinary:    Labia:        Right: No tenderness or lesion.        Left: No tenderness or lesion.      Comments: Sterile Speculum Exam: -Normal External Genitalia: Non tender, no apparent discharge at introitus.  -Vaginal Vault: Pink mucosa with good rugae. Small amt mucoid white discharge noted -Fern and wet prep collected -Cervix:Pink, no lesions, cysts, or polyps.  Appears closed. No active bleeding from os-GC/CT collected -Bimanual Exam:  Closed Musculoskeletal:        General: Normal range of motion.  Skin:    General: Skin is warm and dry.  Neurological:     Mental Status: She is alert and oriented to person, place, and time.  Psychiatric:        Mood and Affect: Mood normal.        Behavior: Behavior normal.        Thought Content: Thought content normal.    Fetal Assessment  145 bpm, Mod Var, -Decels, -Accels Toco: No Ctx graphed  MAU Course   Results for orders placed or performed during the hospital encounter of 02/14/21 (from the past 24 hour(s))  Urinalysis, Routine w reflex microscopic Urine,  Clean Catch     Status: Abnormal   Collection Time: 02/15/21 12:51 AM  Result Value Ref Range   Color, Urine YELLOW YELLOW   APPearance HAZY (A) CLEAR   Specific Gravity, Urine 1.019 1.005 - 1.030  pH 6.0 5.0 - 8.0   Glucose, UA NEGATIVE NEGATIVE mg/dL   Hgb urine dipstick NEGATIVE NEGATIVE   Bilirubin Urine NEGATIVE NEGATIVE   Ketones, ur 80 (A) NEGATIVE mg/dL   Protein, ur NEGATIVE NEGATIVE mg/dL   Nitrite NEGATIVE NEGATIVE   Leukocytes,Ua NEGATIVE NEGATIVE  Wet prep, genital     Status: Abnormal   Collection Time: 02/15/21  2:06 AM   Specimen: PATH Cytology Cervicovaginal Ancillary Only  Result Value Ref Range   Yeast Wet Prep HPF POC NONE SEEN NONE SEEN   Trich, Wet Prep NONE SEEN NONE SEEN   Clue Cells Wet Prep HPF POC NONE SEEN NONE SEEN   WBC, Wet Prep HPF POC MANY (A) NONE SEEN   Sperm NONE SEEN    No results found.  MDM PE Labs: Freddie Apley prep, GC/CT EFM Assessment and Plan  20 year old G1P0  SIUP at 24.1 weeks Cat I FT R/O ROM  -POC Reviewed -Exam performed and findings discussed. -Cultures collected and pending.  -Fern Collected -Discussed normalcy of vaginal discharge and urinary incontinence during pregnancy. -Educated on leukorrhea and increased volume during pregnancy that can be mistaken for amniotic fluid.  -NST Reassuring for GA -Will await results and reassess.  Cherre Robins MSN, CNM 02/15/2021, 2:03 AM   Reassessment (3:16 AM)  -Wet prep returns without significant findings.  Crist Fat Negative. -Nurse instructed to review results and give return precautions to patient. -Discharged to home in stable condition.  Cherre Robins MSN, CNM Advanced Practice Provider, [facility] [employer]

## 2021-02-16 LAB — GC/CHLAMYDIA PROBE AMP (~~LOC~~) NOT AT ARMC
Chlamydia: NEGATIVE
Comment: NEGATIVE
Comment: NORMAL
Neisseria Gonorrhea: NEGATIVE

## 2021-02-17 DIAGNOSIS — E1065 Type 1 diabetes mellitus with hyperglycemia: Secondary | ICD-10-CM | POA: Diagnosis not present

## 2021-02-18 ENCOUNTER — Other Ambulatory Visit: Payer: Self-pay

## 2021-02-18 ENCOUNTER — Telehealth: Payer: Self-pay | Admitting: Obstetrics & Gynecology

## 2021-02-18 NOTE — Telephone Encounter (Signed)
Patient is calling stating that Dr. Despina Hidden upped her insulin and she is down to one pen and is needing a script send to for the last order fax [phone] att: Martie [name] this is her diabtic dr

## 2021-02-19 NOTE — BH Specialist Note (Signed)
Integrated Behavioral Health via Telemedicine Visit  02/19/2021 [name] [id number]  Number of Integrated Behavioral Health visits: 2 Session Start time: 2:19  Session End time: 2:55 Total time:  76  Referring Provider: Shawna Clamp, CNM Patient/Family location: Home [facility] Provider location: [facility]  All persons participating in visit: [name] [name] and [facility] Andrea Ferrer   Types of Service: Individual psychotherapy and Video visit  I connected with Patria LeeAnne Sorrel and/or Akesha LeeAnne Sorrel's  n/a  via  Psychologist, clinical  (Video is Surveyor, mining) and verified that I am speaking with the correct person using two identifiers. Discussed confidentiality: Yes   I discussed the limitations of telemedicine and the availability of in person appointments.  Discussed there is a possibility of technology failure and discussed alternative modes of communication if that failure occurs.  I discussed that engaging in this telemedicine visit, they consent to the provision of behavioral healthcare and the services will be billed under their insurance.  Patient and/or legal guardian expressed understanding and consented to Telemedicine visit: Yes   Presenting Concerns: Patient and/or family reports the following symptoms/concerns: Primary concern is life stress; using self-coping strategies, and looking forward to moving into new home soon.  Duration of problem: Ongoing, with increase in pregnancy; Severity of problem: moderate  Patient and/or Family's Strengths/Protective Factors: Social connections, Social and Emotional competence, Concrete supports in place (healthy food, safe environments, etc.), and Sense of purpose  Goals Addressed: Patient will:  Reduce symptoms of: anxiety, depression, and stress   Demonstrate ability to: Increase healthy adjustment to current  life circumstances  Progress towards Goals: Ongoing  Interventions: Interventions utilized:  Supportive Reflection Standardized Assessments completed: Not Needed Patient and/or Family Response: Pt agrees with treatment plan  Assessment: Patient currently experiencing Anxiety disorder, unspecified.   Patient may benefit from continued psychoeducation and brief therapeutic interventions regarding coping with symptoms of anxiety, depression, stress .  Plan: Follow up with behavioral health clinician on : One month Behavioral recommendations:  -Continue using daily self-coping strategies to manage life stress and emotions -Continue prioritizing healthy self-care and following medical advice regarding managing healthy pregnancy -Continue with plan to obtain maternity belt to help ease discomfort; continue plan to move into new home (both as soon as able) Referral(s): Integrated [employer] (In Clinic)  I discussed the assessment and treatment plan with the patient and/or parent/guardian. They were provided an opportunity to ask questions and all were answered. They agreed with the plan and demonstrated an understanding of the instructions.   They were advised to call back or seek an in-person evaluation if the symptoms worsen or if the condition fails to improve as anticipated.  Rae Lips, LCSW  Depression screen [facility] 2/9 02/03/2021 12/10/2020 10/15/2020 07/10/2020 07/08/2020  Decreased Interest 1 2 2 1 1   Down, Depressed, Hopeless 0 0 2 1 1   PHQ - 2 Score 1 2 4 2 2   Altered sleeping 2 3 3 1 1   Tired, decreased energy 3 3 3 3 3   Change in appetite 0 1 1 0 0  Feeling bad or failure about yourself  0 0 1 0 0  Trouble concentrating 1 1 3 3 3   Moving slowly or fidgety/restless 0 0 2 0 0  Suicidal thoughts 0 0 0 0 0  PHQ-9 Score 7 10 17 9 9   Difficult doing work/chores Somewhat difficult - - Somewhat difficult Somewhat difficult  Some recent data might be hidden   GAD 7  : Generalized Anxiety Score 02/03/2021 12/10/2020 10/15/2020 07/08/2020  Nervous, Anxious, on Edge 1 1 3 1   Control/stop worrying 1 1 3 1   Worry too much - different things 1 1 3 1   Trouble relaxing 2 2 3  0  Restless 0 3 3 2   Easily annoyed or irritable 3 2 3 1   Afraid - awful might happen 1 1 3 1   Total GAD 7 Score 9 11 21 7   Anxiety Difficulty Somewhat difficult - - Somewhat difficult

## 2021-02-20 DIAGNOSIS — O24012 Pre-existing diabetes mellitus, type 1, in pregnancy, second trimester: Secondary | ICD-10-CM | POA: Diagnosis not present

## 2021-02-23 ENCOUNTER — Other Ambulatory Visit: Payer: Self-pay

## 2021-02-23 ENCOUNTER — Ambulatory Visit (INDEPENDENT_AMBULATORY_CARE_PROVIDER_SITE_OTHER): Payer: Medicaid Other | Admitting: Obstetrics & Gynecology

## 2021-02-23 ENCOUNTER — Encounter: Payer: Self-pay | Admitting: Obstetrics & Gynecology

## 2021-02-23 VITALS — BP 115/75 | HR 113 | Wt 205.5 lb

## 2021-02-23 DIAGNOSIS — O24319 Unspecified pre-existing diabetes mellitus in pregnancy, unspecified trimester: Secondary | ICD-10-CM

## 2021-02-23 DIAGNOSIS — Z3A25 25 weeks gestation of pregnancy: Secondary | ICD-10-CM

## 2021-02-23 DIAGNOSIS — O0992 Supervision of high risk pregnancy, unspecified, second trimester: Secondary | ICD-10-CM

## 2021-02-23 LAB — POCT URINALYSIS DIPSTICK OB
Blood, UA: NEGATIVE
Ketones, UA: NEGATIVE
Leukocytes, UA: NEGATIVE
Nitrite, UA: NEGATIVE
POC,PROTEIN,UA: NEGATIVE

## 2021-02-23 NOTE — Progress Notes (Signed)
HIGH-RISK PREGNANCY VISIT Patient name: Linda [name] MRN [id number]  Date of birth: Aug 10, 2000 Chief Complaint:   High Risk Gestation  History of Present Illness:   Linda [name] is a 20 y.o. G78P0000 female at [redacted]w[redacted]d with an Estimated Date of Delivery: 06/06/21 being seen today for ongoing management of a high-risk pregnancy complicated by Class C DM.    Today she reports no complaints. Contractions: Not present. Vag. Bleeding: None.  Movement: Present. denies leaking of fluid.   Depression screen [facility] 2/9 02/03/2021 12/10/2020 10/15/2020 07/10/2020 07/08/2020  Decreased Interest 1 2 2 1 1   Down, Depressed, Hopeless 0 0 2 1 1   PHQ - 2 Score 1 2 4 2 2   Altered sleeping 2 3 3 1 1   Tired, decreased energy 3 3 3 3 3   Change in appetite 0 1 1 0 0  Feeling bad or failure about yourself  0 0 1 0 0  Trouble concentrating 1 1 3 3 3   Moving slowly or fidgety/restless 0 0 2 0 0  Suicidal thoughts 0 0 0 0 0  PHQ-9 Score 7 10 17 9 9   Difficult doing work/chores Somewhat difficult - - Somewhat difficult Somewhat difficult  Some recent data might be hidden     GAD 7 : Generalized Anxiety Score 02/03/2021 12/10/2020 10/15/2020 07/08/2020  Nervous, Anxious, on Edge 1 1 3 1   Control/stop worrying 1 1 3 1   Worry too much - different things 1 1 3 1   Trouble relaxing 2 2 3  0  Restless 0 3 3 2   Easily annoyed or irritable 3 2 3 1   Afraid - awful might happen 1 1 3 1   Total GAD 7 Score 9 11 21 7   Anxiety Difficulty Somewhat difficult - - Somewhat difficult     Review of Systems:   Pertinent items are noted in HPI Denies abnormal vaginal discharge w/ itching/odor/irritation, headaches, visual changes, shortness of breath, chest pain, abdominal pain, severe nausea/vomiting, or problems with urination or bowel movements unless otherwise stated above. Pertinent History Reviewed:  Reviewed past medical,surgical, social, obstetrical and family history.  Reviewed problem list, medications and  allergies. Physical Assessment:   Vitals:   02/23/21 1343  BP: 115/75  Pulse: (!) 113  Weight: 205 lb 8 oz (93.2 kg)  Body mass index is 37.59 kg/m.           Physical Examination:   General appearance: alert, well appearing, and in no distress  Mental status: alert, oriented to person, place, and time  Skin: warm & dry   Extremities: Edema: Trace    Cardiovascular: normal heart rate noted  Respiratory: normal respiratory effort, no distress  Abdomen: gravid, soft, non-tender  Pelvic: Cervical exam deferred         Fetal Status:     Movement: Present    Fetal Surveillance Testing today: FHR 150   Chaperone: N/A    Results for orders placed or performed in visit on 02/23/21 (from the past 24 hour(s))  POC Urinalysis Dipstick OB   Collection Time: 02/23/21  1:48 PM  Result Value Ref Range   Color, UA     Clarity, UA     Glucose, UA Trace (A) Negative   Bilirubin, UA     Ketones, UA neg    Spec Grav, UA     Blood, UA neg    pH, UA     POC,PROTEIN,UA Negative Negative, Trace, Small (1+), Moderate (2+), Large (3+), 4+   Urobilinogen, UA  Nitrite, UA neg    Leukocytes, UA Negative Negative   Appearance     Odor      Assessment & Plan:  High-risk pregnancy: G1P0000 at [redacted]w[redacted]d with an Estimated Date of Delivery: 06/06/21   1) Class C DM, having some issues with high and lows, decrease sensitivity to 1:10, increasing snacks between the two meals, may need to adjust the tresiba, normal fetal ECHO  2) Hypothyroid, stable  Meds: No orders of the defined types were placed in this encounter.   Labs/procedures today: none  Treatment Plan:  MyChart message by Friday to discuss CBG control  Reviewed: Preterm labor symptoms and general obstetric precautions including but not limited to vaginal bleeding, contractions, leaking of fluid and fetal movement were reviewed in detail with the patient.  All questions were answered. Does have home bp cuff. Office bp cuff given: not  applicable. Check bp weekly, let us know if consistently >140 and/or >90.  Follow-up: Return in about 17 days (around 03/12/2021) for sonogram OB, HROB, with Dr Despina Hidden.   Future Appointments  Date Time Provider Department Center  03/04/2021  2:15 PM [facility] HEALTH CLINICIAN [name]    Orders Placed This Encounter  Procedures   US OB Follow Up   POC Urinalysis Dipstick OB   Lazaro Arms 02/23/2021 2:29 PM

## 2021-02-27 DIAGNOSIS — E1065 Type 1 diabetes mellitus with hyperglycemia: Secondary | ICD-10-CM | POA: Diagnosis not present

## 2021-02-27 DIAGNOSIS — O09892 Supervision of other high risk pregnancies, second trimester: Secondary | ICD-10-CM | POA: Diagnosis not present

## 2021-02-27 DIAGNOSIS — E559 Vitamin D deficiency, unspecified: Secondary | ICD-10-CM | POA: Diagnosis not present

## 2021-02-27 DIAGNOSIS — R5383 Other fatigue: Secondary | ICD-10-CM | POA: Diagnosis not present

## 2021-02-27 DIAGNOSIS — Z3A26 26 weeks gestation of pregnancy: Secondary | ICD-10-CM | POA: Diagnosis not present

## 2021-02-27 DIAGNOSIS — E10649 Type 1 diabetes mellitus with hypoglycemia without coma: Secondary | ICD-10-CM | POA: Diagnosis not present

## 2021-02-27 DIAGNOSIS — O24012 Pre-existing diabetes mellitus, type 1, in pregnancy, second trimester: Secondary | ICD-10-CM | POA: Diagnosis not present

## 2021-03-04 ENCOUNTER — Ambulatory Visit (INDEPENDENT_AMBULATORY_CARE_PROVIDER_SITE_OTHER): Payer: Medicaid Other | Admitting: Clinical

## 2021-03-04 DIAGNOSIS — F419 Anxiety disorder, unspecified: Secondary | ICD-10-CM

## 2021-03-12 ENCOUNTER — Encounter: Payer: Self-pay | Admitting: Obstetrics & Gynecology

## 2021-03-12 ENCOUNTER — Ambulatory Visit (INDEPENDENT_AMBULATORY_CARE_PROVIDER_SITE_OTHER): Payer: Medicaid Other | Admitting: Obstetrics & Gynecology

## 2021-03-12 ENCOUNTER — Other Ambulatory Visit: Payer: Self-pay

## 2021-03-12 ENCOUNTER — Ambulatory Visit (INDEPENDENT_AMBULATORY_CARE_PROVIDER_SITE_OTHER): Payer: Medicaid Other

## 2021-03-12 VITALS — BP 126/80 | HR 127 | Wt 206.0 lb

## 2021-03-12 DIAGNOSIS — Z3A27 27 weeks gestation of pregnancy: Secondary | ICD-10-CM

## 2021-03-12 DIAGNOSIS — O0992 Supervision of high risk pregnancy, unspecified, second trimester: Secondary | ICD-10-CM

## 2021-03-12 DIAGNOSIS — Z23 Encounter for immunization: Secondary | ICD-10-CM

## 2021-03-12 DIAGNOSIS — O24319 Unspecified pre-existing diabetes mellitus in pregnancy, unspecified trimester: Secondary | ICD-10-CM | POA: Diagnosis not present

## 2021-03-12 NOTE — Progress Notes (Signed)
[place] 27+5 wks,cephalic,cx 3.6 cm,afi 17 cm,fhr 154 bpm,anterior placenta gr 0,EFW 1128 g 40%,anatomy of the heart complete,no obvious abnormalities

## 2021-03-12 NOTE — Progress Notes (Signed)
HIGH-RISK PREGNANCY VISIT Patient name: Linda [name] MRN [id number]  Date of birth: 11-01-2000 Chief Complaint:   No chief complaint on file.  History of Present Illness:   Linda [name] is a 20 y.o. G29P0000 female at [redacted]w[redacted]d with an Estimated Date of Delivery: 06/06/21 being seen today for ongoing management of a high-risk pregnancy complicated by Class C DM.    Today she reports no complaints. Contractions: Not present. Vag. Bleeding: None.  Movement: Present. denies leaking of fluid.   Depression screen [facility] 2/9 02/03/2021 12/10/2020 10/15/2020 07/10/2020 07/08/2020  Decreased Interest 1 2 2 1 1   Down, Depressed, Hopeless 0 0 2 1 1   PHQ - 2 Score 1 2 4 2 2   Altered sleeping 2 3 3 1 1   Tired, decreased energy 3 3 3 3 3   Change in appetite 0 1 1 0 0  Feeling bad or failure about yourself  0 0 1 0 0  Trouble concentrating 1 1 3 3 3   Moving slowly or fidgety/restless 0 0 2 0 0  Suicidal thoughts 0 0 0 0 0  PHQ-9 Score 7 10 17 9 9   Difficult doing work/chores Somewhat difficult - - Somewhat difficult Somewhat difficult  Some recent data might be hidden     GAD 7 : Generalized Anxiety Score 02/03/2021 12/10/2020 10/15/2020 07/08/2020  Nervous, Anxious, on Edge 1 1 3 1   Control/stop worrying 1 1 3 1   Worry too much - different things 1 1 3 1   Trouble relaxing 2 2 3  0  Restless 0 3 3 2   Easily annoyed or irritable 3 2 3 1   Afraid - awful might happen 1 1 3 1   Total GAD 7 Score 9 11 21 7   Anxiety Difficulty Somewhat difficult - - Somewhat difficult     Review of Systems:   Pertinent items are noted in HPI Denies abnormal vaginal discharge w/ itching/odor/irritation, headaches, visual changes, shortness of breath, chest pain, abdominal pain, severe nausea/vomiting, or problems with urination or bowel movements unless otherwise stated above. Pertinent History Reviewed:  Reviewed past medical,surgical, social, obstetrical and family history.  Reviewed problem list, medications and  allergies. Physical Assessment:   Vitals:   03/12/21 1545  BP: 126/80  Pulse: (!) 127  Weight: 206 lb (93.4 kg)  Body mass index is 37.68 kg/m.           Physical Examination:   General appearance: alert, well appearing, and in no distress  Mental status: alert, oriented to person, place, and time  Skin: warm & dry   Extremities: Edema: None    Cardiovascular: normal heart rate noted  Respiratory: normal respiratory effort, no distress  Abdomen: gravid, soft, non-tender  Pelvic: Cervical exam deferred         Fetal Status:     Movement: Present    Fetal Surveillance Testing today: sonogram is normal   Chaperone: N/A    No results found for this or any previous visit (from the past 24 hour(s)).  Assessment & Plan:  High-risk pregnancy: G1P0000 at [redacted]w[redacted]d with an Estimated Date of Delivery: 06/06/21   1) Class C DM, stable, doing great A1C now 5.5 Tresiba 65 pm Novolog 1:10 + 30 with first meal Novolog 1:10 + 25 with 2nd meal    Meds: No orders of the defined types were placed in this encounter.   Labs/procedures today: U/S  Treatment Plan:  continue protocl care for Class C DM  Reviewed: Preterm labor symptoms and general obstetric  precautions including but not limited to vaginal bleeding, contractions, leaking of fluid and fetal movement were reviewed in detail with the patient.  All questions were answered. Does have home bp cuff. Office bp cuff given: not applicable. Check bp weekly, let us know if consistently >140 and/or >90.  Follow-up: No follow-ups on file.   Future Appointments  Date Time Provider Department Center  04/01/2021  1:15 PM [facility] HEALTH CLINICIAN [facility]    Orders Placed This Encounter  Procedures   Tdap vaccine greater than or equal to 7yo IM   CBC   HIV Antibody (routine testing w rflx)   RPR   Antibody screen   Lazaro Arms  03/12/2021 4:17 PM

## 2021-03-13 LAB — CBC
Hematocrit: 34.9 % (ref 34.0–46.6)
Hemoglobin: 11.5 g/dL (ref 11.1–15.9)
MCH: 30.2 pg (ref 26.6–33.0)
MCHC: 33 g/dL (ref 31.5–35.7)
MCV: 92 fL (ref 79–97)
Platelets: 276 10*3/uL (ref 150–450)
RBC: 3.81 x10E6/uL (ref 3.77–5.28)
RDW: 12 % (ref 11.7–15.4)
WBC: 8.8 10*3/uL (ref 3.4–10.8)

## 2021-03-13 LAB — RPR: RPR Ser Ql: NONREACTIVE

## 2021-03-13 LAB — HIV ANTIBODY (ROUTINE TESTING W REFLEX): HIV Screen 4th Generation wRfx: NONREACTIVE

## 2021-03-13 LAB — ANTIBODY SCREEN: Antibody Screen: NEGATIVE

## 2021-03-18 NOTE — BH Specialist Note (Signed)
Integrated Behavioral Health via Telemedicine Visit  03/18/2021 Sally-Ann Cutbirth [id number]  Number of Integrated Behavioral Health visits: 3 Session Start time: 1:15  Session End time: 1:59 Total time:  28  Referring Provider: Shawna Clamp, CNM Patient/Family location: Home [facility] Provider location: [facility]  All persons participating in visit: [name] [name] and [facility] [place]   Types of Service: Individual psychotherapy and Video visit  I connected with Shy LeAnne Winker and/or Jozalyn LeAnne Schepers's  n/a  via  Telephone or Engineer, civil (consulting)  (Video is Surveyor, mining) and verified that I am speaking with the correct person using two identifiers. Discussed confidentiality: Yes   I discussed the limitations of telemedicine and the availability of in person appointments.  Discussed there is a possibility of technology failure and discussed alternative modes of communication if that failure occurs.  I discussed that engaging in this telemedicine visit, they consent to the provision of behavioral healthcare and the services will be billed under their insurance.  Patient and/or legal guardian expressed understanding and consented to Telemedicine visit: Yes   Presenting Concerns: Patient and/or family reports the following symptoms/concerns: Stress over uncertainties concerning moving into new home (major repairs after mold is found) and birth (vaginal vs cesarean), as well as upcoming tests with optometrist (testing for MS and diabetic retinopathy); pt is coping with stress with many self-coping strategies. Duration of problem: Current pregnancy increase; Severity of problem: moderate  Patient and/or Family's Strengths/Protective Factors: Social connections, Social and Emotional competence, Concrete supports in place (healthy food, safe environments, etc.), and Sense of  purpose  Goals Addressed: Patient will:  Reduce symptoms of: anxiety, depression, and stress   Demonstrate ability to: Increase healthy adjustment to current life circumstances  Progress towards Goals: Ongoing  Interventions: Interventions utilized:  Solution-Focused Strategies and Supportive Reflection Standardized Assessments completed: Not Needed  Patient and/or Family Response: Pt agrees with treatmen plan  Assessment: Patient currently experiencing Anxiety disorder, unspecified.   Patient may benefit from continued psychoeducation and brief therapeutic interventions regarding coping with symptoms of anxiety and stress .  Plan: Follow up with behavioral health clinician on : One month; call Asher Muir as needed prior to scheduled visit at [phone] Behavioral recommendations:  -Continue taking time for calming activities (games, coloring, time alone, cleaning/decluttering/organizing) while waiting to move into new home -Continue using maternity belt for increased back comfort for as long as needed -Continue plan to attend all upcoming medical appointments (including optometry) Referral(s): Integrated [employer] (In Clinic)  I discussed the assessment and treatment plan with the patient and/or parent/guardian. They were provided an opportunity to ask questions and all were answered. They agreed with the plan and demonstrated an understanding of the instructions.   They were advised to call back or seek an in-person evaluation if the symptoms worsen or if the condition fails to improve as anticipated.  Rae Lips, LCSW  Depression screen [facility] 2/9 04/01/2021 02/03/2021 12/10/2020 10/15/2020 07/10/2020  Decreased Interest 0 1 2 2 1   Down, Depressed, Hopeless 0 0 0 2 1  PHQ - 2 Score 0 1 2 4 2   Altered sleeping 0 2 3 3 1   Tired, decreased energy 0 3 3 3 3   Change in appetite 0 0 1 1 0  Feeling bad or failure about yourself  0 0 0 1 0  Trouble concentrating 0 1 1  3 3   Moving slowly or fidgety/restless 0 0 0  2 0  Suicidal thoughts 0 0 0 0 0  PHQ-9 Score 0 7 10 17 9   Difficult doing work/chores - Somewhat difficult - - Somewhat difficult  Some recent data might be hidden   GAD 7 : Generalized Anxiety Score 04/01/2021 02/03/2021 12/10/2020 10/15/2020  Nervous, Anxious, on Edge 0 1 1 3   Control/stop worrying 0 1 1 3   Worry too much - different things 0 1 1 3   Trouble relaxing 0 2 2 3   Restless 0 0 3 3  Easily annoyed or irritable 0 3 2 3   Afraid - awful might happen 0 1 1 3   Total GAD 7 Score 0 9 11 21   Anxiety Difficulty - Somewhat difficult - -

## 2021-03-21 DIAGNOSIS — E1065 Type 1 diabetes mellitus with hyperglycemia: Secondary | ICD-10-CM | POA: Diagnosis not present

## 2021-03-25 ENCOUNTER — Ambulatory Visit (INDEPENDENT_AMBULATORY_CARE_PROVIDER_SITE_OTHER): Payer: Medicaid Other | Admitting: Nurse Practitioner

## 2021-03-25 DIAGNOSIS — Z77120 Contact with and (suspected) exposure to mold (toxic): Secondary | ICD-10-CM

## 2021-03-25 MED ORDER — SALINE SPRAY 0.65 % NA SOLN
1.0000 | NASAL | 2 refills | Status: DC | PRN
Start: 2021-03-25 — End: 2021-05-27

## 2021-03-25 MED ORDER — CETIRIZINE HCL 10 MG PO TABS: 10.0000 mg | ORAL_TABLET | Freq: Every day | ORAL | 0 refills | Status: DC

## 2021-03-25 NOTE — Assessment & Plan Note (Signed)
-   Increase hydration, -Educated patient to get rid of moles by employing a professional to clean house and avoid all contact with mold in the future -Saline nasal spray -Antihistamine [cetirizine] Patient is 7 months pregnant, advised to please monitor her salt and follow-up with worsening unresolved symptoms. Patient verbalized understanding

## 2021-03-25 NOTE — Progress Notes (Signed)
   Virtual Visit  Note Due to COVID-19 pandemic this visit was conducted virtually. This visit type was conducted due to national recommendations for restrictions regarding the COVID-19 Pandemic (e.g. social distancing, sheltering in place) in an effort to limit this patient's exposure and mitigate transmission in our community. All issues noted in this document were discussed and addressed.  A physical exam was not performed with this format.  I connected with Linda [name] on 03/25/21 at 11:15 AM by telephone and verified that I am speaking with the correct person using two identifiers. Linda [name] is currently located at home during visit. The provider, Daryll Drown, NP is located in their office at time of visit.  I discussed the limitations, risks, security and privacy concerns of performing an evaluation and management service by telephone and the availability of in person appointments. I also discussed with the patient that there may be a patient responsible charge related to this service. The patient expressed understanding and agreed to proceed.   History and Present Illness:  HPI   Patient exposed to mold from cleaning her new house.  She denies fever, wheezing and rash,  Nausea or vomiting.  She is reporting cough, stuffy nose, red and itchy eyes, mild sore throat and rhinorrhea.   Review of Systems  Constitutional:  Negative for chills, fever and malaise/fatigue.  HENT:  Positive for congestion and sore throat.   Respiratory:  Negative for wheezing.   Skin:  Negative for rash.  All other systems reviewed and are negative.   Observations/Objective: Televisit patient not in distress  Assessment and Plan: -Ocean nasal spray -Zyrtec 10 mg tablet by mouth for 7 days. -Limited oral source of mold sources in the house Follow-up with unresolved or worsening symptoms  Follow Up Instructions: Follow-up with unresolved symptoms    I discussed the assessment  and treatment plan with the patient. The patient was provided an opportunity to ask questions and all were answered. The patient agreed with the plan and demonstrated an understanding of the instructions.   The patient was advised to call back or seek an in-person evaluation if the symptoms worsen or if the condition fails to improve as anticipated.  The above assessment and management plan was discussed with the patient. The patient verbalized understanding of and has agreed to the management plan. Patient is aware to call the clinic if symptoms persist or worsen. Patient is aware when to return to the clinic for a follow-up visit. Patient educated on when it is appropriate to go to the emergency department.   Time call ended: 11:25 AM  I provided 10 minutes of  non face-to-face time during this encounter.    Daryll Drown, NP

## 2021-04-01 ENCOUNTER — Ambulatory Visit (INDEPENDENT_AMBULATORY_CARE_PROVIDER_SITE_OTHER): Payer: Medicaid Other | Admitting: Family Medicine

## 2021-04-01 ENCOUNTER — Ambulatory Visit (INDEPENDENT_AMBULATORY_CARE_PROVIDER_SITE_OTHER): Payer: Medicaid Other | Admitting: Clinical

## 2021-04-01 ENCOUNTER — Encounter: Payer: Self-pay | Admitting: Family Medicine

## 2021-04-01 ENCOUNTER — Other Ambulatory Visit: Payer: Self-pay

## 2021-04-01 VITALS — BP 116/75 | HR 111 | Temp 97.3°F | Ht 62.0 in | Wt 213.0 lb

## 2021-04-01 DIAGNOSIS — H539 Unspecified visual disturbance: Secondary | ICD-10-CM

## 2021-04-01 DIAGNOSIS — F419 Anxiety disorder, unspecified: Secondary | ICD-10-CM | POA: Diagnosis not present

## 2021-04-01 DIAGNOSIS — Z23 Encounter for immunization: Secondary | ICD-10-CM

## 2021-04-01 DIAGNOSIS — H43393 Other vitreous opacities, bilateral: Secondary | ICD-10-CM

## 2021-04-01 NOTE — Patient Instructions (Signed)
[facility] [place], [place] [address] [phone] (main office) [phone] [facility] office)  [email or web address] (Register for childbirth education class of choice)     BRAINSTORMING  Develop a Plan Goals: Provide a way to start conversation about your new life with a baby Assist parents in recognizing and using resources within their reach Help pave the way before birth for an easier period of transition afterwards.  Make a list of the following information to keep in a central location: Full name of Mom and Partner: _____________________________________________ 49 full name and Date of Birth: ___________________________________________ Home Address: ___________________________________________________________ ________________________________________________________________________ Home Phone: ____________________________________________________________ Parents' cell numbers: _____________________________________________________ ________________________________________________________________________ Name and contact info for OB: ______________________________________________ Name and contact info for Pediatrician:________________________________________ Contact info for Lactation Consultants: ________________________________________  REST and SLEEP *You each need at least 4-5 hours of uninterrupted sleep every day. Write specific names and contact information.* How are you going to rest in the postpartum period? While partner's home? When partner returns to work? When you both return to work? Where will your baby sleep? Who is available to help during the day? Evening? Night? Who could move in for a period to help support you? What are some ideas to help you get enough  sleep? __________________________________________________________________________________________________________________________________________________________________________________________________________________________________________ NUTRITIOUS FOOD AND DRINK *Plan for meals before your baby is born so you can have healthy food to eat during the immediate postpartum period.* Who will look after breakfast? Lunch? Dinner? List names and contact information. Brainstorm quick, healthy ideas for each meal. What can you do before baby is born to prepare meals for the postpartum period? How can others help you with meals? Which grocery stores provide online shopping and delivery? Which restaurants offer take-out or delivery options? ______________________________________________________________________________________________________________________________________________________________________________________________________________________________________________________________________________________________________________________________________________________________________________________________________  CARE FOR MOM *It's important that mom is cared for and pampered in the postpartum period. Remember, the most important ways new mothers need care are: sleep, nutrition, gentle exercise, and time off.* Who can come take care of mom during this period? Make a list of people with their contact information. List some activities that make you feel cared for, rested, and energized? Who can make sure you have opportunities to do these things? Does mom have a space of her very own within your home that's just for her? Make a "[facility]" where she can be comfortable, rest, and renew herself  daily. ______________________________________________________________________________________________________________________________________________________________________________________________________________________________________________________________________________________________________________________________________________________________________________________________________    CARE FOR AND FEEDING BABY *Knowledgeable and encouraging people will offer the best support with regard to feeding your baby.* Educate yourself and choose the best feeding option for your baby. Make a list of people who will guide, support, and be a resource for you as your care for and feed your baby. (Friends that have breastfed or are currently breastfeeding, lactation consultants, breastfeeding support groups, etc.) Consider a postpartum doula. (These websites can give you information: dona.org & BuyingShow.es) Seek out local breastfeeding resources like the breastfeeding support group at [employer] or [employer]. ______________________________________________________________________________________________________________________________________________________________________________________________________________________________________________________________________________________________________________________________________________________________________________________________________  Verner Chol AND ERRANDS Who can help with a thorough cleaning before baby is born? Make a list of people who will help with housekeeping and chores, like laundry, light cleaning, dishes, bathrooms, etc. Who can run some errands for you? What can you do to make sure you are stocked with basic supplies before baby is born? Who is going to do the  shopping? ______________________________________________________________________________________________________________________________________________________________________________________________________________________________________________________________________________________________________________________________________________________________________________________________________     Family Adjustment *Nurture yourselves.it helps parents be more loving and allows for better bonding with their child.*  What sorts of things do you and partner enjoy doing together? Which activities help you to connect and strengthen your relationship? Make a list of those things. Make a list of people whom you trust to care for your baby so you can have some time together as a couple. What types of things help partner feel connected to Mom? Make a list. What needs will partner have in order to bond with baby? Other children? Who will care for them when you go into labor and while you are in the hospital? Think about what the needs of your older children might be. Who can help you meet those needs? In what ways are you helping them prepare for bringing baby home? List some specific strategies you have for family adjustment. _______________________________________________________________________________________________________________________________________________________________________________________________________________________________________________________________________________________________________________________________________________  SUPPORT *Someone who can empathize with experiences normalizes your problems and makes them more bearable.* Make a list of other friends, neighbors, and/or co-workers you know with infants (and small children, if applicable) with whom you can connect. Make a list of local or online support groups, mom groups, etc. in which you can be  involved. ______________________________________________________________________________________________________________________________________________________________________________________________________________________________________________________________________________________________________________________________________________________________________________________________________  Childcare Plans Investigate and plan for childcare if mom is returning to work. Talk about mom's concerns about her transition back to work. Talk about partner's concerns regarding this transition.  Mental Health *Your mental health is one of the highest priorities for a pregnant or postpartum mom.* 1 in 5 women experience anxiety and/or depression from the time of conception through the first year after birth. Postpartum Mood Disorders are the #1 complication of pregnancy and childbirth and the suffering experienced by these mothers is not necessary! These illnesses are temporary and respond well to treatment, which often includes self-care, social support, talk therapy, and medication when needed. Women experiencing anxiety and depression often say things like: "I'm supposed to be happy.why do I feel so sad?", "Why can't I snap out of it?", "I'm having thoughts that scare me." There is no need to be embarrassed if you are feeling these symptoms: Overwhelmed, anxious, angry, sad, guilty, irritable, hopeless, exhausted but can't sleep You are NOT alone. You are NOT to blame. With help, you WILL be well. Where can I find help? Medical professionals such as your OB, midwife, gynecologist, family practitioner, primary care provider, pediatrician, or mental health providers; [facility] support groups: Feelings After Birth, Breastfeeding Support Group, Baby and Me Group, and Fit 4 Two exercise classes. You have permission to ask for help. It will confirm your feelings, validate your experiences,  share/learn coping strategies, and gain support and encouragement as you heal. You are important! BRAINSTORM Make a list of local resources, including resources for mom and for partner. Identify support groups. Identify people to call late at night - include names and contact info. Talk with partner about perinatal mood and anxiety disorders. Talk with your OB, midwife, and doula about baby blues and about perinatal mood and anxiety disorders. Talk with your pediatrician about perinatal mood and anxiety disorders.   Support & Sanity Savers   What do you really need?  Basics In preparing for a new baby, many expectant parents spend hours shopping for baby clothes, decorating the nursery, and deciding which car seat to buy. Yet most don't think much about what the reality of parenting a newborn will be like, and what they need to make it through that. So, here is the advice of experienced parents. We know you'll read this, and think "they're exaggerating, I don't really need that." Just  trust [place] on these, OK? Plan for all of this, and if it turns out you don't need it, come back and teach [place] how you did it!  Must-Haves (Once baby's survival needs are met, make sure you attend to your own survival needs!) Sleep An average newborn sleeps 16-18 hours per day, over 6-7 sleep periods, rarely more than three hours at a time. It is normal and healthy for a newborn to wake throughout the night... but really hard on parents!! Naps. Prioritize sleep above any responsibilities like: cleaning house, visiting friends, running errands, etc.  Sleep whenever baby sleeps. If you can't nap, at least have restful times when baby eats. The more rest you get, the more patient you will be, the more emotionally stable, and better at solving problems.  Food You may not have realized it would be difficult to eat when you have a newborn. Yet, when we talk to countless new parents, they say things like "it may be 2:00 pm  when I realize I haven't had breakfast yet." Or "every time we sit down to dinner, baby needs to eat, and my food gets cold, so I don't bother to eat it." Finger food. Before your baby is born, stock up with one months' worth of food that: 1) you can eat with one hand while holding a baby, 2) doesn't need to be prepped, 3) is good hot or cold, 4) doesn't spoil when left out for a few hours, and 5) you like to eat. Think about: nuts, dried fruit, Clif bars, pretzels, jerky, gogurt, baby carrots, apples, bananas, crackers, cheez-n-crackers, string cheese, hot pockets or frozen burritos to microwave, garden burgers and breakfast pastries to put in the toaster, yogurt drinks, etc. Restaurant Menus. Make lists of your favorite restaurants & menu items. When family/friends want to help, you can give specific information without much thought. They can either bring you the food or send gift cards for just the right meals. Freezer Meals.  Take some time to make a few meals to put in the freezer ahead of time.  Easy to freeze meals can be anything such as soup, lasagna, chicken pie, or spaghetti sauce. Set up a Meal Schedule.  Ask friends and family to sign up to bring you meals during the first few weeks of being home. (It can be passed around at baby showers!) You have no idea how helpful this will be until you are in the throes of parenting.  MachineLive.it is a great website to check out. Emotional Support Know who to call when you're stressed out. Parenting a newborn is very challenging work. There are times when it totally overwhelms your normal coping abilities. EVERY NEW PARENT NEEDS TO HAVE A PLAN FOR WHO TO CALL WHEN THEY JUST CAN'T COPE ANY MORE. (And it has to be someone other than the baby's other parent!) Before your baby is born, come up with at least one person you can call for support - write their phone number down and post it on the refrigerator. Anxiety & Sadness. Baby blues are normal after  pregnancy; however, there are more severe types of anxiety & sadness which can occur and should not be ignored.  They are always treatable, but you have to take the first step by reaching out for help. [facility] offers a "Mom Talk" group which meets every Tuesday from 10 am - 11 am.  This group is for new moms who need support and connection after their babies are born.  Call [phone].  Really, Really Helpful (Plan for them! Make sure these happen often!!) Physical Support with Taking Care of Yourselves Asking friends and family. Before your baby is born, set up a schedule of people who can come and visit and help out (or ask a friend to schedule for you). Any time someone says "let me know what I can do to help," sign them up for a day. When they get there, their job is not to take care of the baby (that's your job and your joy). Their job is to take care of you!  Postpartum doulas. If you don't have anyone you can call on for support, look into postpartum doulas:  professionals at helping parents with caring for baby, caring for themselves, getting breastfeeding started, and helping with household tasks. www.padanc.org is a helpful website for learning about doulas in our area. Peer Support / Parent Groups Why: One of the greatest ideas for new parents is to be around other new parents. Parent groups give you a chance to share and listen to others who are going through the same season of life, get a sense of what is normal infant development by watching several babies learn and grow, share your stories of triumph and struggles with empathetic ears, and forgive your own mistakes when you realize all parents are learning by trial and error. Where to find: There are many places you can meet other new parents throughout our community.  [facility] offers the following classes for new moms and their little ones:  Baby and Me (Birth to [place]) and Breastfeeding Support Group. Go to  www.conehealthybaby.com or call [phone] for more information. Time for your Relationship It's easy to get so caught up in meeting baby's immediate needs that it's hard to find time to connect with your partner, and meet the needs of your relationship. It's also easy to forget what "quality time with your partner" actually looks like. If you take your baby on a date, you'd be amazed how much of your couple time is spent feeding the baby, diapering the baby, admiring the baby, and talking about the baby. Dating: Try to take time for just the two of you. Babysitter tip: Sometimes when moms are breastfeeding a newborn, they find it hard to figure out how to schedule outings around baby's unpredictable feeding schedules. Have the babysitter come for a three hour period. When she comes over, if baby has just eaten, you can leave right away, and come back in two hours. If baby hasn't fed recently, you start the date at home. Once baby gets hungry and gets a good feeding in, you can head out for the rest of your date time. Date Nights at Home: If you can't get out, at least set aside one evening a week to prioritize your relationship: whenever baby dozes off or doesn't have any immediate needs, spend a little time focusing on each other. Potential conflicts: The main relationship conflicts that come up for new parents are: issues related to sexuality, financial stresses, a feeling of an unfair division of household tasks, and conflicts in parenting styles. The more you can work on these issues before baby arrives, the better!  Fun and Frills (Don't forget these. and don't feel guilty for indulging in them!) Everyone has something in life that is a fun little treat that they do just for themselves. It may be: reading the morning paper, or going for a daily jog, or having coffee with a friend once a  week, or going to a movie on Friday nights, or fine chocolates, or bubble baths, or curling up with a good  book. Unless you do fun things for yourself every now and then, it's hard to have the energy for fun with your baby. Whatever your "special" treats are, make sure you find a way to continue to indulge in them after your baby is born. These special moments can recharge you, and allow you to return to baby with a new joy   PERINATAL MOOD DISORDERS: [place]   Emergency and Crisis Resources:  If you are an imminent risk to self or others, are experiencing intense personal distress, and/or have noticed significant changes in activities of daily living, call:  [facility]: [place]: [place]: [phone] Or visit the following crisis centers: Local Emergency Departments [facility]: [address] [place]. Hours: 8:30AM-5PM. Insurance Accepted: Medicaid, Medicare, and Uninsured.  [facility]  [address] [place] Mon-Friday 8am-3pm  [phone]                                                                                    Non-Crisis Resources: To identify specific providers that are covered by your insurance, contact your insurance company or local agencies: [place]: [phone] [facility] and [place]: [phone] [place]: [phone] Postpartum [employer]- [place] [phone]                                                      Outpatient therapy and medication management providers:  [facility] [phone] Hours: 9AM-5PM  Insurance Accepted: Alben Spittle, Lorella Nimrod, Freddrick March, [place], Medicare  [facility] ([place]) [phone] Hours: 8AM-5PM  nsurance Accepted: All insurances EXCEPT AARP, [place], [place], and [place]: [id number]             Hours: 8AM-8PM Insurance  Accepted: Cristal Ford, Freddrick March, [place], Medicare, [id number] [facility]: [phone] Hours: 8:30AM-7PM Insurance Accepted: Cristal Ford, Medicaid, Medicare, Tricare, [employer] Counseling:  [place] Accepted:  Holland Falling, Lorella Nimrod, [facility], [place], [facility] [phone] Hours: 9AM-5:30PM Insurance Accepted: Alben Spittle, Charlotte Crumb, and Medicaid, Medicare, [employer] [facility]:  [phone] Hours: 9am-5pm Insurance Accepted: BCBS; they do not accept Medicaid/Medicare [place]: [id number] Hours: 9am-9pm Insurance Accepted: All major insurance including Medicaid and Medicare [facility]: [phone] Hours: 9AM-5:30PM Insurance Accepted: All insurances EXCEPT Medicaid and Medicare. [facility]: [phone]  Parenting [place]: [phone] [facility]:  [place] (support for children in the NICU and/or with special needs), [place] [facility]: [phone]                                                                                     Online Resources: Postpartum Support International: http://jones-[email or web address]/  800-944-4PPD 2Moms Supporting Moms:  www.momssupportingmoms.net

## 2021-04-01 NOTE — Progress Notes (Signed)
Subjective:  Patient ID: Linda [name], female    DOB: Oct 20, 2000, 20 y.o.   MRN: [id number]  Patient Care Team: Janora Norlander, DO as PCP - General (Family Medicine)   Chief Complaint:  Blurred Vision (Going on for a few weeks.  Blurry vision, grainy, Patient pregnant)   HPI: Linda [name] is a 20 y.o. female presenting on 04/01/2021 for Blurred Vision (Going on for a few weeks.  Blurry vision, grainy, Patient pregnant)   Pt presents today with complaints of floaters in bilateral eyes. States she has had floaters for several years but they have worsened over the last few weeks. States the number of floaters has increased and at times they have a green discoloration to them. She reports images can seem blurry due to the amount of floaters but she has not lost vision. No loss of peripheral vision or curtain effect on vision. She denies any musculoskeletal weakness, no neurological focal deficits. She states allergies usually worsen the symptoms. She has been taking antihistamines without relief. She is [id number]. Followed by OB on a regular basis. Has follow up this week. She tried to get in to see her eye doctor but was unable to afford visit.     Relevant past medical, surgical, family, and social history reviewed and updated as indicated.  Allergies and medications reviewed and updated. Data reviewed: Chart in Epic.   Past Medical History:  Diagnosis Date   Allergy    Anxiety    Asthma    Carpal tunnel syndrome    Depression    Diabetes mellitus [place] complication [place] [address]  Type 1   Tachycardia    Tinnitus     Past Surgical History:  Procedure Laterality Date   ADENOIDECTOMY     TONSILLECTOMY     WISDOM TOOTH EXTRACTION      Social History   Socioeconomic History   Marital status: DSoil scientist   Spouse name: Not on file   Number of children: Not on file   Years of education: Not on file   Highest education level: Not on file   Occupational History   Not on file  Tobacco Use   Smoking status: Never    Passive exposure: Yes   Smokeless tobacco: Never  Vaping Use   Vaping Use: Former  Substance and Sexual Activity   Alcohol use: No   Drug use: No   Sexual activity: Yes    Birth control/protection: None  Other Topics Concern   Not on file  Social History Narrative   Not on file   Social Determinants of Health   Financial Resource Strain: Low Risk    Difficulty of Paying Living Expenses: Not hard at all  Food Insecurity: No Food Insecurity   Worried About RCharity fundraiserin the Last Year: Never true   RTaylorin the Last Year: Never true  Transportation Needs: No Transportation Needs   Lack of Transportation (Medical): No   Lack of Transportation (Non-Medical): No  Physical Activity: Inactive   Days of Exercise per Week: 0 days   Minutes of Exercise per Session: 0 min  Stress: Stress Concern Present   Feeling of Stress : Very much  Social Connections: Moderately Isolated   Frequency of Communication with Friends and Family: More than three times a week   Frequency of Social Gatherings with Friends and Family: More than three times a week   Attends Religious Services: Never  Active Member of Clubs or Organizations: No   Attends Archivist Meetings: Never   Marital Status: Living with partner  Intimate Partner Violence: Not At Risk   Fear of Current or Ex-Partner: No   Emotionally Abused: No   Physically Abused: No   Sexually Abused: No    Outpatient Encounter Medications as of 04/01/2021  Medication Sig   aspirin 81 MG EC tablet Take 1 tablet (81 mg total) by mouth daily. Swallow whole.   BD PEN NEEDLE NANO 2ND GEN 32G X 4 MM MISC SMARTSIG:Injection   Cholecalciferol (VITAMIN D3 PO) Take by mouth daily.   Continuous Blood Gluc Sensor (DEXCOM G6 SENSOR) MISC SMARTSIG:Topical Every 10 Days   glucagon 1 MG injection Inject 1 mg into the muscle once as needed (for blood  sugar).   insulin degludec (TRESIBA FLEXTOUCH) 100 UNIT/ML FlexTouch Pen 65 Units daily.   levothyroxine (SYNTHROID) 25 MCG tablet Take 1 tablet (25 mcg total) by mouth daily before breakfast.   loratadine (CLARITIN) 10 MG tablet Take 10 mg by mouth daily.   MELATONIN PO Take by mouth as needed.   metFORMIN (GLUCOPHAGE) 500 MG tablet Take 500 mg by mouth 2 (two) times daily with a meal.   NOVOLOG FLEXPEN 100 UNIT/ML FlexPen Patient taking 120units   omeprazole (PRILOSEC) 20 MG capsule Take 1 capsule (20 mg total) by mouth daily.   Prenatal Vit-Fe Fumarate-FA (PRENATAL VITAMIN PO) Take 1 tablet by mouth daily.   promethazine (PHENERGAN) 25 MG tablet Take 1 tablet (25 mg total) by mouth every 6 (six) hours as needed for nausea or vomiting.   sodium chloride (OCEAN) 0.65 % SOLN nasal spray Place 1 spray into both nostrils as needed for congestion.   [DISCONTINUED] busPIRone (BUSPAR) 15 MG tablet Take 1 tablet (15 mg total) by mouth 2 (two) times daily.   [DISCONTINUED] cetirizine (ZYRTEC ALLERGY) 10 MG tablet Take 1 tablet (10 mg total) by mouth daily. Take only for 7 days, due to pregnancy   [DISCONTINUED] Desvenlafaxine Succinate ER 25 MG TB24 Take 25 mg by mouth daily.   No facility-administered encounter medications on file as of 04/01/2021.    No Known Allergies  Review of Systems  Constitutional:  Negative for activity change, appetite change, chills, diaphoresis, fatigue, fever and unexpected weight change.  HENT: Negative.    Eyes:  Positive for visual disturbance. Negative for photophobia, pain, discharge, redness and itching.  Respiratory:  Negative for cough, chest tightness and shortness of breath.   Cardiovascular:  Negative for chest pain, palpitations and leg swelling.  Gastrointestinal:  Negative for abdominal pain, blood in stool, constipation, diarrhea, nausea and vomiting.  Endocrine: Negative.  Negative for polydipsia, polyphagia and polyuria.  Genitourinary:  Negative for  decreased urine volume, difficulty urinating, dysuria, frequency and urgency.  Musculoskeletal:  Negative for arthralgias and myalgias.  Skin: Negative.   Allergic/Immunologic: Negative.   Neurological:  Negative for dizziness, tremors, seizures, syncope, facial asymmetry, speech difficulty, weakness, light-headedness, numbness and headaches.  Hematological: Negative.   Psychiatric/Behavioral:  Negative for confusion, hallucinations, sleep disturbance and suicidal ideas.   All other systems reviewed and are negative.      Objective:  BP 116/75   Pulse (!) 111   Temp (!) 97.3 F (36.3 C)   Ht '5\' 2"'  (1.575 m)   Wt 213 lb (96.6 kg)   LMP 08/30/2020   SpO2 100%   BMI 38.96 kg/m    Wt Readings from Last 3 Encounters:  04/01/21 213 lb (  96.6 kg)  03/12/21 206 lb (93.4 kg)  02/23/21 205 lb 8 oz (93.2 kg)    Physical Exam Vitals and nursing note reviewed.  Constitutional:      General: She is not in acute distress.    Appearance: Normal appearance. She is well-developed and well-groomed. She is obese. She is not ill-appearing, toxic-appearing or diaphoretic.  HENT:     Head: Normocephalic and atraumatic.     Jaw: There is normal jaw occlusion.     Right Ear: Hearing, tympanic membrane, ear canal and external ear normal.     Left Ear: Hearing, tympanic membrane, ear canal and external ear normal.     Nose: Nose normal.     Mouth/Throat:     Lips: Pink.     Mouth: Mucous membranes are moist.     Pharynx: Oropharynx is clear. Uvula midline.  Eyes:     General: Lids are normal. Vision grossly intact. Gaze aligned appropriately.     Extraocular Movements: Extraocular movements intact.     Right eye: Normal extraocular motion and no nystagmus.     Left eye: Normal extraocular motion and no nystagmus.     Conjunctiva/sclera: Conjunctivae normal.     Pupils: Pupils are equal, round, and reactive to light.     Funduscopic exam:    Right eye: Red reflex present.        Left eye: Red  reflex present.    Comments: Attempted fundoscopic exam in office, unable to clearly visualize field.   Neck:     Thyroid: No thyroid mass, thyromegaly or thyroid tenderness.     Vascular: No carotid bruit or JVD.     Trachea: Trachea and phonation normal.  Cardiovascular:     Rate and Rhythm: Normal rate and regular rhythm.     Chest Wall: PMI is not displaced.     Pulses: Normal pulses.     Heart sounds: Normal heart sounds. No murmur heard.   No friction rub. No gallop.  Pulmonary:     Effort: Pulmonary effort is normal. No respiratory distress.     Breath sounds: Normal breath sounds. No wheezing.  Abdominal:     General: Bowel sounds are normal. There is no abdominal bruit.     Palpations: Abdomen is soft. There is no hepatomegaly or splenomegaly.     Comments: Gravida abdomen  Musculoskeletal:        General: Normal range of motion.     Cervical back: Normal range of motion and neck supple.     Right lower leg: No edema.     Left lower leg: No edema.  Lymphadenopathy:     Cervical: No cervical adenopathy.  Skin:    General: Skin is warm and dry.     Capillary Refill: Capillary refill takes less than 2 seconds.     Coloration: Skin is not cyanotic, jaundiced or pale.     Findings: No rash.  Neurological:     General: No focal deficit present.     Mental Status: She is alert and oriented to person, place, and time.     Cranial Nerves: Cranial nerves are intact. No cranial nerve deficit.     Sensory: Sensation is intact. No sensory deficit.     Motor: Motor function is intact. No weakness.     Coordination: Coordination is intact. Coordination normal.     Gait: Gait is intact. Gait normal.     Deep Tendon Reflexes: Reflexes are normal and symmetric. Reflexes normal.  Psychiatric:        Attention and Perception: Attention and perception normal.        Mood and Affect: Mood and affect normal.        Speech: Speech normal.        Behavior: Behavior normal. Behavior is  cooperative.        Thought Content: Thought content normal.        Cognition and Memory: Cognition and memory normal.        Judgment: Judgment normal.    Results for orders placed or performed in visit on 03/12/21  CBC  Result Value Ref Range   WBC 8.8 3.4 - 10.8 x10E3/uL   RBC 3.81 3.77 - 5.28 x10E6/uL   Hemoglobin 11.5 11.1 - 15.9 g/dL   Hematocrit 34.9 34.0 - 46.6 %   MCV 92 79 - 97 fL   MCH 30.2 26.6 - 33.0 pg   MCHC 33.0 31.5 - 35.7 g/dL   RDW 12.0 11.7 - 15.4 %   Platelets 276 150 - 450 x10E3/uL  HIV Antibody (routine testing w rflx)  Result Value Ref Range   HIV Screen 4th Generation wRfx Non Reactive Non Reactive  RPR  Result Value Ref Range   RPR Ser Ql Non Reactive Non Reactive  Antibody screen  Result Value Ref Range   Antibody Screen Negative Negative       Pertinent labs & imaging results that were available during my care of the patient were reviewed by me and considered in my medical decision making.  Assessment & Plan:  Jolaine was seen today for blurred vision.  Diagnoses and all orders for this visit:  Visual disturbances Bilateral visual floaters Unable to see ophthalmology due to cost. Concerning for uveitis or diabetic retinopathy. Pt encouraged to follow up as this could lead to permanent loss of vision if left undiagnosed / untreated. Has follow up with OB tomorrow. BP and BS well controlled. Will check CBC and CMP today. Pt aware to report any new, worsening, or persistent symptoms. If unable to see ophthalmology, will need to determine possible underlying cause of symptoms such as infections or autoimmune processes. Follow up in 2 weeks or sooner if needed.  -     CBC with Differential/Platelet -     CMP14+EGFR -     Ambulatory referral to Ophthalmology  Need for immunization against influenza -     Flu Vaccine QUAD 72moIM (Fluarix, Fluzone & Alfiuria Quad PF)     Continue all other maintenance medications.  Follow up plan: Return in about  2 weeks (around 04/15/2021), or if symptoms worsen or fail to improve.    The above assessment and management plan was discussed with the patient. The patient verbalized understanding of and has agreed to the management plan. Patient is aware to call the clinic if they develop any new symptoms or if symptoms persist or worsen. Patient is aware when to return to the clinic for a follow-up visit. Patient educated on when it is appropriate to go to the emergency department.   MMonia Pouch FNP-C [facility] [phone]

## 2021-04-02 ENCOUNTER — Encounter: Payer: Self-pay | Admitting: Obstetrics & Gynecology

## 2021-04-02 ENCOUNTER — Ambulatory Visit (INDEPENDENT_AMBULATORY_CARE_PROVIDER_SITE_OTHER): Payer: Medicaid Other | Admitting: Obstetrics & Gynecology

## 2021-04-02 VITALS — BP 128/88 | HR 125 | Wt 213.3 lb

## 2021-04-02 DIAGNOSIS — O24319 Unspecified pre-existing diabetes mellitus in pregnancy, unspecified trimester: Secondary | ICD-10-CM

## 2021-04-02 DIAGNOSIS — Z331 Pregnant state, incidental: Secondary | ICD-10-CM

## 2021-04-02 DIAGNOSIS — O0993 Supervision of high risk pregnancy, unspecified, third trimester: Secondary | ICD-10-CM

## 2021-04-02 DIAGNOSIS — Z1389 Encounter for screening for other disorder: Secondary | ICD-10-CM

## 2021-04-02 LAB — CMP14+EGFR
ALT: 29 IU/L (ref 0–32)
AST: 20 IU/L (ref 0–40)
Albumin/Globulin Ratio: 1.1 — ABNORMAL LOW (ref 1.2–2.2)
Albumin: 3 g/dL — ABNORMAL LOW (ref 3.9–5.0)
Alkaline Phosphatase: 147 IU/L — ABNORMAL HIGH (ref 42–106)
BUN/Creatinine Ratio: 7 — ABNORMAL LOW (ref 9–23)
BUN: 4 mg/dL — ABNORMAL LOW (ref 6–20)
Bilirubin Total: 0.3 mg/dL (ref 0.0–1.2)
CO2: 17 mmol/L — ABNORMAL LOW (ref 20–29)
Calcium: 8.5 mg/dL — ABNORMAL LOW (ref 8.7–10.2)
Chloride: 104 mmol/L (ref 96–106)
Creatinine, Ser: 0.58 mg/dL (ref 0.57–1.00)
Globulin, Total: 2.7 g/dL (ref 1.5–4.5)
Glucose: 44 mg/dL — ABNORMAL LOW (ref 70–99)
Potassium: 3.8 mmol/L (ref 3.5–5.2)
Sodium: 139 mmol/L (ref 134–144)
Total Protein: 5.7 g/dL — ABNORMAL LOW (ref 6.0–8.5)
eGFR: 133 mL/min/{1.73_m2} (ref >59)

## 2021-04-02 LAB — CBC WITH DIFFERENTIAL/PLATELET
Basophils Absolute: 0 10*3/uL (ref 0.0–0.2)
Basos: 0 %
EOS (ABSOLUTE): 0.1 10*3/uL (ref 0.0–0.4)
Eos: 1 %
Hematocrit: 34 % (ref 34.0–46.6)
Hemoglobin: 10.9 g/dL — ABNORMAL LOW (ref 11.1–15.9)
Immature Grans (Abs): 0.1 10*3/uL (ref 0.0–0.1)
Immature Granulocytes: 2 %
Lymphocytes Absolute: 2 10*3/uL (ref 0.7–3.1)
Lymphs: 23 %
MCH: 28.9 pg (ref 26.6–33.0)
MCHC: 32.1 g/dL (ref 31.5–35.7)
MCV: 90 fL (ref 79–97)
Monocytes Absolute: 0.6 10*3/uL (ref 0.1–0.9)
Monocytes: 6 %
Neutrophils Absolute: 6 10*3/uL (ref 1.4–7.0)
Neutrophils: 68 %
Platelets: 305 10*3/uL (ref 150–450)
RBC: 3.77 x10E6/uL (ref 3.77–5.28)
RDW: 12.1 % (ref 11.7–15.4)
WBC: 8.8 10*3/uL (ref 3.4–10.8)

## 2021-04-02 LAB — POCT URINALYSIS DIPSTICK OB
Blood, UA: NEGATIVE
Glucose, UA: NEGATIVE
Ketones, UA: NEGATIVE
Leukocytes, UA: NEGATIVE
Nitrite, UA: NEGATIVE
POC,PROTEIN,UA: NEGATIVE

## 2021-04-02 NOTE — Progress Notes (Signed)
HIGH-RISK PREGNANCY VISIT Patient name: Linda [name] MRN [id number]  Date of birth: March 24, 2001 Chief Complaint:   Routine Prenatal Visit (Seeing spot/ waiting for appointment from opthamalogy)  History of Present Illness:   Linda [name] is a 20 y.o. G48P0000 female at [redacted]w[redacted]d with an Estimated Date of Delivery: 06/06/21 being seen today for ongoing management of a high-risk pregnancy complicated by Class C DM.    Today she reports no complaints. Contractions: Not present.  .  Movement: Present. denies leaking of fluid.   Depression screen [facility] 2/9 04/01/2021 02/03/2021 12/10/2020 10/15/2020 07/10/2020  Decreased Interest 0 1 2 2 1   Down, Depressed, Hopeless 0 0 0 2 1  PHQ - 2 Score 0 1 2 4 2   Altered sleeping 0 2 3 3 1   Tired, decreased energy 0 3 3 3 3   Change in appetite 0 0 1 1 0  Feeling bad or failure about yourself  0 0 0 1 0  Trouble concentrating 0 1 1 3 3   Moving slowly or fidgety/restless 0 0 0 2 0  Suicidal thoughts 0 0 0 0 0  PHQ-9 Score 0 7 10 17 9   Difficult doing work/chores - Somewhat difficult - - Somewhat difficult  Some recent data might be hidden     GAD 7 : Generalized Anxiety Score 04/01/2021 02/03/2021 12/10/2020 10/15/2020  Nervous, Anxious, on Edge 0 1 1 3   Control/stop worrying 0 1 1 3   Worry too much - different things 0 1 1 3   Trouble relaxing 0 2 2 3   Restless 0 0 3 3  Easily annoyed or irritable 0 3 2 3   Afraid - awful might happen 0 1 1 3   Total GAD 7 Score 0 9 11 21   Anxiety Difficulty - Somewhat difficult - -     Review of Systems:   Pertinent items are noted in HPI Denies abnormal vaginal discharge w/ itching/odor/irritation, headaches, visual changes, shortness of breath, chest pain, abdominal pain, severe nausea/vomiting, or problems with urination or bowel movements unless otherwise stated above. Pertinent History Reviewed:  Reviewed past medical,surgical, social, obstetrical and family history.  Reviewed problem list, medications and  allergies. Physical Assessment:   Vitals:   04/02/21 1434 04/02/21 1452  BP: 138/88 128/88  Pulse: (!) 112 (!) 125  Weight: 213 lb 4.8 oz (96.8 kg)   Body mass index is 39.01 kg/m.           Physical Examination:   General appearance: alert, well appearing, and in no distress  Mental status: alert, oriented to person, place, and time  Skin: warm & dry   Extremities: Edema: None    Cardiovascular: normal heart rate noted  Respiratory: normal respiratory effort, no distress  Abdomen: gravid, soft, non-tender  Pelvic: Cervical exam deferred         Fetal Status:     Movement: Present    Fetal Surveillance Testing today: FHR 137   Chaperone: N/A    Results for orders placed or performed in visit on 04/02/21 (from the past 24 hour(s))  POC Urinalysis Dipstick OB   Collection Time: 04/02/21  2:49 PM  Result Value Ref Range   Color, UA     Clarity, UA     Glucose, UA Negative Negative   Bilirubin, UA     Ketones, UA neg    Spec Grav, UA     Blood, UA neg    pH, UA     POC,PROTEIN,UA Negative Negative, Trace, Small (1+),  Moderate (2+), Large (3+), 4+   Urobilinogen, UA     Nitrite, UA neg    Leukocytes, UA Negative Negative   Appearance     Odor      Assessment & Plan:  High-risk pregnancy: G1P0000 at [redacted]w[redacted]d with an Estimated Date of Delivery: 06/06/21   1) Class C DM, a bit difficult to consistently manage, her eating is inconsistent and then sets up ad omino effecto f low/high cycling,  Tresiba 65 units daily Novolog 1:10 + 30 u w/first meal Novolog 1:10 + 25 u w/second meal  Encourgaed to eat consistent snacks    Meds: No orders of the defined types were placed in this encounter.   Labs/procedures today: none  Treatment Plan:  2 weeks begins twice weekly visits, recommend weekly BPP and NST alternating for Class C Diabetic  Reviewed: Preterm labor symptoms and general obstetric precautions including but not limited to vaginal bleeding, contractions, leaking  of fluid and fetal movement were reviewed in detail with the patient.  All questions were answered. Does have home bp cuff. Office bp cuff given: not applicable. Check bp weekly, let us know if consistently >140 and/or >90.  Follow-up: Return in about 12 days (around 04/14/2021) for NST, Nurse only: 04/17/21 OB sonogram + HROB visit.   Future Appointments  Date Time Provider Department Center  04/29/2021  1:15 PM [facility]    Orders Placed This Encounter  Procedures   POC Urinalysis Dipstick OB   Linda [name]  04/02/2021 3:30 PM

## 2021-04-08 ENCOUNTER — Ambulatory Visit (INDEPENDENT_AMBULATORY_CARE_PROVIDER_SITE_OTHER): Payer: Medicaid Other | Admitting: Nurse Practitioner

## 2021-04-08 ENCOUNTER — Inpatient Hospital Stay (HOSPITAL_COMMUNITY)
Admission: AD | Admit: 2021-04-08 | Discharge: 2021-04-09 | Disposition: A | Discharge status: Home / Self Care | Payer: Medicaid Other | Attending: Obstetrics and Gynecology | Admitting: Obstetrics and Gynecology

## 2021-04-08 ENCOUNTER — Other Ambulatory Visit: Payer: Self-pay

## 2021-04-08 ENCOUNTER — Inpatient Hospital Stay (HOSPITAL_COMMUNITY): Payer: Medicaid Other

## 2021-04-08 ENCOUNTER — Encounter: Payer: Self-pay | Admitting: Nurse Practitioner

## 2021-04-08 ENCOUNTER — Encounter (HOSPITAL_COMMUNITY): Payer: Self-pay | Admitting: Obstetrics and Gynecology

## 2021-04-08 VITALS — BP 119/82 | HR 140 | Temp 98.0°F | Ht 62.0 in | Wt 212.2 lb

## 2021-04-08 DIAGNOSIS — Z7984 Long term (current) use of oral hypoglycemic drugs: Secondary | ICD-10-CM | POA: Insufficient documentation

## 2021-04-08 DIAGNOSIS — Z7722 Contact with and (suspected) exposure to environmental tobacco smoke (acute) (chronic): Secondary | ICD-10-CM | POA: Diagnosis not present

## 2021-04-08 DIAGNOSIS — O99891 Other specified diseases and conditions complicating pregnancy: Secondary | ICD-10-CM | POA: Insufficient documentation

## 2021-04-08 DIAGNOSIS — R0602 Shortness of breath: Secondary | ICD-10-CM | POA: Diagnosis not present

## 2021-04-08 DIAGNOSIS — E039 Hypothyroidism, unspecified: Secondary | ICD-10-CM | POA: Diagnosis not present

## 2021-04-08 DIAGNOSIS — Z8349 Family history of other endocrine, nutritional and metabolic diseases: Secondary | ICD-10-CM | POA: Diagnosis not present

## 2021-04-08 DIAGNOSIS — E109 Type 1 diabetes mellitus without complications: Secondary | ICD-10-CM | POA: Diagnosis not present

## 2021-04-08 DIAGNOSIS — R42 Dizziness and giddiness: Secondary | ICD-10-CM | POA: Insufficient documentation

## 2021-04-08 DIAGNOSIS — O24013 Pre-existing diabetes mellitus, type 1, in pregnancy, third trimester: Secondary | ICD-10-CM | POA: Insufficient documentation

## 2021-04-08 DIAGNOSIS — E1065 Type 1 diabetes mellitus with hyperglycemia: Secondary | ICD-10-CM

## 2021-04-08 DIAGNOSIS — Z7982 Long term (current) use of aspirin: Secondary | ICD-10-CM | POA: Insufficient documentation

## 2021-04-08 DIAGNOSIS — O99283 Endocrine, nutritional and metabolic diseases complicating pregnancy, third trimester: Secondary | ICD-10-CM | POA: Insufficient documentation

## 2021-04-08 DIAGNOSIS — M549 Dorsalgia, unspecified: Secondary | ICD-10-CM

## 2021-04-08 DIAGNOSIS — Z3A31 31 weeks gestation of pregnancy: Secondary | ICD-10-CM | POA: Insufficient documentation

## 2021-04-08 DIAGNOSIS — Z8249 Family history of ischemic heart disease and other diseases of the circulatory system: Secondary | ICD-10-CM | POA: Diagnosis not present

## 2021-04-08 DIAGNOSIS — F419 Anxiety disorder, unspecified: Secondary | ICD-10-CM | POA: Insufficient documentation

## 2021-04-08 DIAGNOSIS — Z794 Long term (current) use of insulin: Secondary | ICD-10-CM | POA: Diagnosis not present

## 2021-04-08 DIAGNOSIS — O26893 Other specified pregnancy related conditions, third trimester: Secondary | ICD-10-CM | POA: Diagnosis not present

## 2021-04-08 DIAGNOSIS — Z79899 Other long term (current) drug therapy: Secondary | ICD-10-CM | POA: Diagnosis not present

## 2021-04-08 DIAGNOSIS — O0993 Supervision of high risk pregnancy, unspecified, third trimester: Secondary | ICD-10-CM

## 2021-04-08 DIAGNOSIS — M546 Pain in thoracic spine: Secondary | ICD-10-CM | POA: Diagnosis not present

## 2021-04-08 DIAGNOSIS — R000 Tachycardia, unspecified: Secondary | ICD-10-CM | POA: Diagnosis not present

## 2021-04-08 DIAGNOSIS — R55 Syncope and collapse: Secondary | ICD-10-CM | POA: Diagnosis not present

## 2021-04-08 DIAGNOSIS — O324XX0 Maternal care for high head at term, not applicable or unspecified: Secondary | ICD-10-CM | POA: Insufficient documentation

## 2021-04-08 DIAGNOSIS — I2699 Other pulmonary embolism without acute cor pulmonale: Secondary | ICD-10-CM | POA: Diagnosis not present

## 2021-04-08 LAB — GLUCOSE, CAPILLARY
Glucose-Capillary: 103 mg/dL — ABNORMAL HIGH (ref 70–99)
Glucose-Capillary: 37 mg/dL — CL (ref 70–99)
Glucose-Capillary: 64 mg/dL — ABNORMAL LOW (ref 70–99)

## 2021-04-08 LAB — PROTEIN / CREATININE RATIO, URINE
Creatinine, Urine: 114.27 mg/dL
Protein Creatinine Ratio: 0.09 mg/mg{Cre} (ref 0.00–0.15)
Total Protein, Urine: 10 mg/dL

## 2021-04-08 LAB — COMPREHENSIVE METABOLIC PANEL
ALT: 33 U/L (ref 0–44)
AST: 23 U/L (ref 15–41)
Albumin: 2.2 g/dL — ABNORMAL LOW (ref 3.5–5.0)
Alkaline Phosphatase: 124 U/L (ref 38–126)
Anion gap: 7 (ref 5–15)
BUN: 5 mg/dL — ABNORMAL LOW (ref 6–20)
CO2: 20 mmol/L — ABNORMAL LOW (ref 22–32)
Calcium: 8.5 mg/dL — ABNORMAL LOW (ref 8.9–10.3)
Chloride: 107 mmol/L (ref 98–111)
Creatinine, Ser: 0.54 mg/dL (ref 0.44–1.00)
GFR, Estimated: >60 mL/min (ref >60)
Glucose, Bld: 98 mg/dL (ref 70–99)
Potassium: 3.7 mmol/L (ref 3.5–5.1)
Sodium: 134 mmol/L — ABNORMAL LOW (ref 135–145)
Total Bilirubin: 0.5 mg/dL (ref 0.3–1.2)
Total Protein: 6.2 g/dL — ABNORMAL LOW (ref 6.5–8.1)

## 2021-04-08 LAB — CBC
HCT: 33.6 % — ABNORMAL LOW (ref 36.0–46.0)
Hemoglobin: 10.8 g/dL — ABNORMAL LOW (ref 12.0–15.0)
MCH: 29.3 pg (ref 26.0–34.0)
MCHC: 32.1 g/dL (ref 30.0–36.0)
MCV: 91.3 fL (ref 80.0–100.0)
Platelets: 296 10*3/uL (ref 150–400)
RBC: 3.68 MIL/uL — ABNORMAL LOW (ref 3.87–5.11)
RDW: 12.7 % (ref 11.5–15.5)
WBC: 10.5 10*3/uL (ref 4.0–10.5)
nRBC: 0 % (ref 0.0–0.2)

## 2021-04-08 LAB — URINALYSIS, ROUTINE W REFLEX MICROSCOPIC
Bilirubin Urine: NEGATIVE
Glucose, UA: NEGATIVE mg/dL
Hgb urine dipstick: NEGATIVE
Ketones, ur: NEGATIVE mg/dL
Leukocytes,Ua: NEGATIVE
Nitrite: NEGATIVE
Protein, ur: NEGATIVE mg/dL
Specific Gravity, Urine: 1.016 (ref 1.005–1.030)
pH: 6 (ref 5.0–8.0)

## 2021-04-08 LAB — GLUCOSE HEMOCUE WAIVED: Glu Hemocue Waived: 137 mg/dL — ABNORMAL HIGH (ref 65–99)

## 2021-04-08 LAB — TSH: TSH: 4.504 u[IU]/mL — ABNORMAL HIGH (ref 0.350–4.500)

## 2021-04-08 MED ORDER — LACTATED RINGERS IV SOLN: Freq: Once | INTRAVENOUS | Status: AC

## 2021-04-08 MED ORDER — HYDROXYZINE HCL 25 MG PO TABS
25.0000 mg | ORAL_TABLET | Freq: Three times a day (TID) | ORAL | Status: DC | PRN
  Administered 2021-04-08: 25 mg via ORAL
  Filled 2021-04-08 (×2): qty 1

## 2021-04-08 MED ORDER — IOHEXOL 350 MG/ML SOLN
100.0000 mL | Freq: Once | INTRAVENOUS | Status: AC | PRN
  Administered 2021-04-08: 100 mL via INTRAVENOUS

## 2021-04-08 NOTE — MAU Provider Note (Addendum)
History     CSN: [id number]  Arrival date and time: 04/08/21 1628    Chief Complaint  Patient presents with   Shortness of Breath   Back Pain   20 y.o. G1 @31 .4 wks w/Type I DM and Hypothyroidism presenting with SOB, feeling faint, and upper back pain. Sx started around 1am. She tried taking a showering but then felt like she would pass out. She did not not have syncope. Associated sx are seeing floaters. Reports sx have persisted throughout the day. Denies chest pain. Back pain is located between her shoulder blades. Report her BS have been normal today. Reports good FM. No pregnancy complaints. Hx of anxiety and panic attacks but pt reports her sx have lasted longer today than in the past. Reports hx of tachycardia that she has seen Cardiology for when she was a teen and was told should improve with exercise.   OB History     Gravida  1   Para  0   Term  0   Preterm  0   AB  0   Living  0      SAB  0   IAB  0   Ectopic  0   Multiple  0   Live Births  0           Past Medical History:  Diagnosis Date   Allergy    Anxiety    Asthma    Carpal tunnel syndrome    Depression    Diabetes mellitus without complication (HCC) 2014   Type 1   Tachycardia    Tinnitus     Past Surgical History:  Procedure Laterality Date   ADENOIDECTOMY     TONSILLECTOMY     WISDOM TOOTH EXTRACTION      Family History  Problem Relation Age of Onset   Hypertension Mother    Thyroid disease Mother    Asthma Brother    Thyroid disease Brother    COPD Paternal Grandmother    Hypertension Paternal Grandmother    COPD Maternal Grandmother    Lumbar disc disease Maternal Grandmother    Dementia Maternal Grandmother    Heart attack Maternal Grandfather    Emphysema Maternal Grandfather    Ankylosing spondylitis Maternal Grandfather    Dementia Maternal Grandfather     Social History   Tobacco Use   Smoking status: Never    Passive exposure: Yes   Smokeless  tobacco: Never  Vaping Use   Vaping Use: Former  Substance Use Topics   Alcohol use: No   Drug use: No    Allergies: No Known Allergies  Medications Prior to Admission  Medication Sig Dispense Refill Last Dose   aspirin 81 MG EC tablet Take 1 tablet (81 mg total) by mouth daily. Swallow whole. 90 tablet 3 04/08/2021   BD PEN NEEDLE NANO 2ND GEN 32G X 4 MM MISC SMARTSIG:Injection   04/08/2021   Cholecalciferol (VITAMIN D3 PO) Take by mouth daily.   04/08/2021   Continuous Blood Gluc Sensor (DEXCOM G6 SENSOR) MISC SMARTSIG:Topical Every 10 Days   04/08/2021   insulin degludec (TRESIBA FLEXTOUCH) 100 UNIT/ML FlexTouch Pen 65 Units daily.   04/07/2021   levothyroxine (SYNTHROID) 25 MCG tablet Take 1 tablet (25 mcg total) by mouth daily before breakfast. 90 tablet 3 04/08/2021   loratadine (CLARITIN) 10 MG tablet Take 10 mg by mouth daily.   04/08/2021   metFORMIN (GLUCOPHAGE) 500 MG tablet Take 500 mg by mouth 2 (two) times daily  with a meal.   Past Week   NOVOLOG FLEXPEN 100 UNIT/ML FlexPen Patient taking 120units   04/08/2021 at 1400   omeprazole (PRILOSEC) 20 MG capsule Take 1 capsule (20 mg total) by mouth daily. 7 capsule 0 04/08/2021   Prenatal Vit-Fe Fumarate-FA (PRENATAL VITAMIN PO) Take 1 tablet by mouth daily.   04/08/2021   promethazine (PHENERGAN) 25 MG tablet Take 1 tablet (25 mg total) by mouth every 6 (six) hours as needed for nausea or vomiting. 30 tablet 1 Past Month   glucagon 1 MG injection Inject 1 mg into the muscle once as needed (for blood sugar).      MELATONIN PO Take by mouth as needed.      sodium chloride (OCEAN) 0.65 % SOLN nasal spray Place 1 spray into both nostrils as needed for congestion. 30 mL 2     Review of Systems  Constitutional:  Negative for fever.  Eyes:  Positive for visual disturbance.  Respiratory:  Positive for shortness of breath. Negative for cough.   Cardiovascular:  Negative for chest pain and leg swelling.  Gastrointestinal:  Negative for  abdominal pain.  Genitourinary:  Negative for vaginal bleeding.  Musculoskeletal:  Positive for back pain.  Neurological:  Positive for light-headedness and headaches. Negative for syncope.  Physical Exam   Blood pressure 132/79, pulse (!) 108, temperature 98.4 F (36.9 C), temperature source Oral, resp. rate 18, last menstrual period 08/30/2020, SpO2 97 %. Orthostatic VS for the past 24 hrs:  BP- Lying Pulse- Lying BP- Sitting Pulse- Sitting BP- Standing at 0 minutes Pulse- Standing at 0 minutes  04/08/21 1727 134/76 117 129/72 124 121/72 155   Patient Vitals for the past 24 hrs:  BP Temp Temp src Pulse Resp SpO2  04/08/21 1940 132/79 98.4 F (36.9 C) Oral (!) 108 18 --  04/08/21 1727 134/90 -- -- (!) 145 -- --  04/08/21 1724 121/72 -- -- (!) 155 -- --  04/08/21 1722 134/76 -- -- (!) 117 -- --  04/08/21 1720 138/75 -- -- (!) 133 -- 97 %  04/08/21 1710 136/84 98.2 F (36.8 C) Oral (!) 120 (!) 22 97 %     Physical Exam Vitals and nursing note reviewed.  Constitutional:      General: She is not in acute distress.    Appearance: Normal appearance.  HENT:     Head: Normocephalic.  Cardiovascular:     Rate and Rhythm: Tachycardia present.     Heart sounds: Normal heart sounds.  Pulmonary:     Effort: Pulmonary effort is normal. No respiratory distress.     Breath sounds: Normal breath sounds. No stridor. No wheezing, rhonchi or rales.  Musculoskeletal:     Cervical back: Normal range of motion.     Right lower leg: No edema.     Left lower leg: No edema.  Skin:    General: Skin is warm and dry.  Neurological:     General: No focal deficit present.     Mental Status: She is alert and oriented to person, place, and time.  Psychiatric:        Mood and Affect: Mood is anxious (mild).  EFM: 145 bpm, mod variability, + accels, no decels Toco: none  Results for orders placed or performed during the hospital encounter of 04/08/21 (from the past 24 hour(s))  Urinalysis, Routine  w reflex microscopic Urine, Clean Catch     Status: Abnormal   Collection Time: 04/08/21  4:55 PM  Result Value  Ref Range   Color, Urine YELLOW YELLOW   APPearance HAZY (A) CLEAR   Specific Gravity, Urine 1.016 1.005 - 1.030   pH 6.0 5.0 - 8.0   Glucose, UA NEGATIVE NEGATIVE mg/dL   Hgb urine dipstick NEGATIVE NEGATIVE   Bilirubin Urine NEGATIVE NEGATIVE   Ketones, ur NEGATIVE NEGATIVE mg/dL   Protein, ur NEGATIVE NEGATIVE mg/dL   Nitrite NEGATIVE NEGATIVE   Leukocytes,Ua NEGATIVE NEGATIVE  CBC     Status: Abnormal   Collection Time: 04/08/21  5:17 PM  Result Value Ref Range   WBC 10.5 4.0 - 10.5 K/uL   RBC 3.68 (L) 3.87 - 5.11 MIL/uL   Hemoglobin 10.8 (L) 12.0 - 15.0 g/dL   HCT 29.7 (L) 98.9 - 21.1 %   MCV 91.3 80.0 - 100.0 fL   MCH 29.3 26.0 - 34.0 pg   MCHC 32.1 30.0 - 36.0 g/dL   RDW 94.1 74.0 - 81.4 %   Platelets 296 150 - 400 K/uL   nRBC 0.0 0.0 - 0.2 %  Comprehensive metabolic panel     Status: Abnormal   Collection Time: 04/08/21  5:17 PM  Result Value Ref Range   Sodium 134 (L) 135 - 145 mmol/L   Potassium 3.7 3.5 - 5.1 mmol/L   Chloride 107 98 - 111 mmol/L   CO2 20 (L) 22 - 32 mmol/L   Glucose, Bld 98 70 - 99 mg/dL   BUN 5 (L) 6 - 20 mg/dL   Creatinine, Ser 4.81 0.44 - 1.00 mg/dL   Calcium 8.5 (L) 8.9 - 10.3 mg/dL   Total Protein 6.2 (L) 6.5 - 8.1 g/dL   Albumin 2.2 (L) 3.5 - 5.0 g/dL   AST 23 15 - 41 U/L   ALT 33 0 - 44 U/L   Alkaline Phosphatase 124 38 - 126 U/L   Total Bilirubin 0.5 0.3 - 1.2 mg/dL   GFR, Estimated >85 >63 mL/min   Anion gap 7 5 - 15  Protein / creatinine ratio, urine     Status: None   Collection Time: 04/08/21  5:17 PM  Result Value Ref Range   Creatinine, Urine 114.27 mg/dL   Total Protein, Urine 10 mg/dL   Protein Creatinine Ratio 0.09 0.00 - 0.15 mg/mg[Cre]  Glucose, capillary     Status: Abnormal   Collection Time: 04/08/21  5:36 PM  Result Value Ref Range   Glucose-Capillary 103 (H) 70 - 99 mg/dL  TSH     Status: Abnormal    Collection Time: 04/08/21  5:54 PM  Result Value Ref Range   TSH 4.504 (H) 0.350 - 4.500 uIU/mL   CT Angio Chest PE W and/or Wo Contrast  Result Date: 04/08/2021 CLINICAL DATA:  Presyncope. EXAM: CT ANGIOGRAPHY CHEST WITH CONTRAST TECHNIQUE: Multidetector CT imaging of the chest was performed using the standard protocol during bolus administration of intravenous contrast. Multiplanar CT image reconstructions and MIPs were obtained to evaluate the vascular anatomy. CONTRAST:  OMNIPAQUE IOHEXOL 350 MG/ML SOLN COMPARISON:  None. FINDINGS: Cardiovascular: Satisfactory opacification of the pulmonary arteries to the segmental level. No evidence of pulmonary embolism. Normal heart size. No pericardial effusion. Mediastinum/Nodes: No enlarged mediastinal, hilar, or axillary lymph nodes. Thyroid gland, trachea, and esophagus demonstrate no significant findings. Lungs/Pleura: Lungs are clear. No pleural effusion or pneumothorax. Upper Abdomen: No acute abnormality. Musculoskeletal: No chest wall abnormality. No acute or significant osseous findings. Review of the MIP images confirms the above findings. IMPRESSION: Negative examination for pulmonary embolism or acute cardiopulmonary  disease. Electronically Signed   By: Aram Candela M.D.   On: 04/08/2021 19:40   DG Chest Port 1 View  Result Date: 04/08/2021 CLINICAL DATA:  Shortness of breath. EXAM: PORTABLE CHEST 1 VIEW COMPARISON:  Chest x-ray 09/07/2018. FINDINGS: Cardiomediastinal silhouette is within normal limits for patient is lordotic positioning. There is no focal lung consolidation, pleural effusion or pneumothorax. No acute fractures are seen. IMPRESSION: No active disease. Electronically Signed   By: Darliss Cheney M.D.   On: 04/08/2021 17:45    MAU Course  Procedures Vistaril  MDM Prenatal records reviewed: pregnancy complicated by Type I DM, asthma, Hypothyroidism, anxiety/depression. Labs, CXR, and EKG ordered. EKG shows sinus tachy.  Consult with Dr. Charlotta Newton, plan for chest CTA. Transfer of care given to Lorenda Peck, CNM  04/08/2021 8:10 PM   CTA clear Was still a bit orthostatic, so we started IV fluids and hydrated her After this she felt much better She also had another drop in blood sugar to 46.  Suspect this is due her being given apple juice alone for previous low BS.  So I ordered a sandwich with protein which helped her maintain a better BS of 115 No longer short of breath Discussed we recommend a cardiology consult for intermittent tachycardia.   Assessment and Plan  A:  Single IUP at [redacted]w[redacted]d       Upper back pain, negative CT Angiogram       Dizziiness       Unstable blood sugars in Type I Diabetic        Intermittent tachycardia        Orthostatic dizziness  P:   Discharge        Discussed frequent intake of balanced protein and carbs        Pt to call endocrinologist for eval        Cardiology referral         Encouraged to return if she develops worsening of symptoms, increase in pain, fever, or other concerning symptoms.   Aviva Signs, CNM

## 2021-04-08 NOTE — Patient Instructions (Signed)

## 2021-04-08 NOTE — Addendum Note (Signed)
Addended by: Daryll Drown on: 04/08/2021 03:37 PM   Modules accepted: Orders

## 2021-04-08 NOTE — MAU Note (Signed)
.  Linda [name] is a 20 y.o. at [redacted]w[redacted]d here in MAU reporting: Patient was in shower at 0100 this morning and her vision was going black. She felt like she was going to pass out so she sat down. She is still feeling lightheaded and seeing floaters in her vision. Has a headache she rates 1/10 and she has not taken any medication. Also feeling SOB and having sharp pain in her upper back between her shoulders. Denies VB or LOF. Endorses good fetal movement.   Pain score: 5 Vitals:   04/08/21 1710  BP: 136/84  Pulse: (!) 120  Resp: (!) 22  Temp: 98.2 F (36.8 C)  SpO2: 97%     FHT:153 Lab orders placed from triage:  UA

## 2021-04-08 NOTE — Assessment & Plan Note (Signed)
Worsening dizziness, pain between shoulder blades not well controlled.  Patient is [redacted] weeks pregnant.  Completed finger stick blood glucose, and urine protein. I sent patient to [facility] health emergency department. Patient is high risk.

## 2021-04-08 NOTE — Progress Notes (Addendum)
Acute Office Visit  Subjective:    Patient ID: Linda [name], female    DOB: [birth year], 20 y.o.   MRN: [id number]  Chief Complaint  Patient presents with   Dizziness    Dizziness This is a new problem. The current episode started yesterday. The problem has been unchanged. Associated symptoms include arthralgias, chills, headaches, nausea and weakness. Pertinent negatives include no fever or sore throat. Nothing ([redacted] weeks pregnant) aggravates the symptoms.   Patient is [redacted] weeks pregnant and in the past 24 hours has had pain between shoulder blades, worsening dizziness and almost blacking out.   Past Medical History:  Diagnosis Date   Allergy    Anxiety    Asthma    Carpal tunnel syndrome    Depression    Diabetes mellitus without complication ([place]) 3300   Type 1   Tachycardia    Tinnitus     Past Surgical History:  Procedure Laterality Date   ADENOIDECTOMY     TONSILLECTOMY     WISDOM TOOTH EXTRACTION      Family History  Problem Relation Age of Onset   Hypertension Mother    Thyroid disease Mother    Asthma Brother    Thyroid disease Brother    COPD Paternal Grandmother    Hypertension Paternal Grandmother    COPD Maternal Grandmother    Lumbar disc disease Maternal Grandmother    Dementia Maternal Grandmother    Heart attack Maternal Grandfather    Emphysema Maternal Grandfather    Ankylosing spondylitis Maternal Grandfather    Dementia Maternal Grandfather     Social History   Socioeconomic History   Marital status: Soil scientist    Spouse name: Not on file   Number of children: Not on file   Years of education: Not on file   Highest education level: Not on file  Occupational History   Not on file  Tobacco Use   Smoking status: Never    Passive exposure: Yes   Smokeless tobacco: Never  Vaping Use   Vaping Use: Former  Substance and Sexual Activity   Alcohol use: No   Drug use: No   Sexual activity: Yes    Birth  control/protection: None  Other Topics Concern   Not on file  Social History Narrative   Not on file   Social Determinants of Health   Financial Resource Strain: Low Risk    Difficulty of Paying Living Expenses: Not hard at all  Food Insecurity: No Food Insecurity   Worried About Charity fundraiser in the Last Year: Never true   [place] in the Last Year: Never true  Transportation Needs: No Transportation Needs   Lack of Transportation (Medical): No   Lack of Transportation (Non-Medical): No  Physical Activity: Inactive   Days of Exercise per Week: 0 days   Minutes of Exercise per Session: 0 min  Stress: Stress Concern Present   Feeling of Stress : Very much  Social Connections: Moderately Isolated   Frequency of Communication with Friends and Family: More than three times a week   Frequency of Social Gatherings with Friends and Family: More than three times a week   Attends Religious Services: Never   Marine scientist or Organizations: No   Attends Archivist Meetings: Never   Marital Status: Living with partner  Intimate Partner Violence: Not At Risk   Fear of Current or Ex-Partner: No   Emotionally Abused: No   Physically  Abused: No   Sexually Abused: No    Outpatient Medications Prior to Visit  Medication Sig Dispense Refill   aspirin 81 MG EC tablet Take 1 tablet (81 mg total) by mouth daily. Swallow whole. 90 tablet 3   BD PEN NEEDLE NANO 2ND GEN 32G X 4 MM MISC SMARTSIG:Injection     Cholecalciferol (VITAMIN D3 PO) Take by mouth daily.     Continuous Blood Gluc Sensor (DEXCOM G6 SENSOR) MISC SMARTSIG:Topical Every 10 Days     glucagon 1 MG injection Inject 1 mg into the muscle once as needed (for blood sugar).     insulin degludec (TRESIBA FLEXTOUCH) 100 UNIT/ML FlexTouch Pen 65 Units daily.     levothyroxine (SYNTHROID) 25 MCG tablet Take 1 tablet (25 mcg total) by mouth daily before breakfast. 90 tablet 3   loratadine (CLARITIN) 10 MG  tablet Take 10 mg by mouth daily.     MELATONIN PO Take by mouth as needed.     metFORMIN (GLUCOPHAGE) 500 MG tablet Take 500 mg by mouth 2 (two) times daily with a meal.     NOVOLOG FLEXPEN 100 UNIT/ML FlexPen Patient taking 120units     omeprazole (PRILOSEC) 20 MG capsule Take 1 capsule (20 mg total) by mouth daily. 7 capsule 0   Prenatal Vit-Fe Fumarate-FA (PRENATAL VITAMIN PO) Take 1 tablet by mouth daily.     promethazine (PHENERGAN) 25 MG tablet Take 1 tablet (25 mg total) by mouth every 6 (six) hours as needed for nausea or vomiting. 30 tablet 1   sodium chloride (OCEAN) 0.65 % SOLN nasal spray Place 1 spray into both nostrils as needed for congestion. 30 mL 2   No facility-administered medications prior to visit.    No Known Allergies  Review of Systems  Constitutional:  Positive for chills. Negative for fever.  HENT:  Negative for sore throat.   Gastrointestinal:  Positive for nausea.  Genitourinary:  Negative for flank pain and urgency.  Musculoskeletal:  Positive for arthralgias.  Neurological:  Positive for dizziness, weakness and headaches.      Objective:    Physical Exam Vitals and nursing note reviewed.  Constitutional:      Appearance: Normal appearance.  HENT:     Head: Normocephalic.     Nose: Nose normal.     Mouth/Throat:     Mouth: Mucous membranes are moist.     Pharynx: Oropharynx is clear.  Eyes:     Conjunctiva/sclera: Conjunctivae normal.  Cardiovascular:     Rate and Rhythm: Normal rate and regular rhythm.  Pulmonary:     Effort: Pulmonary effort is normal.     Breath sounds: Normal breath sounds.  Abdominal:     General: Bowel sounds are normal.  Musculoskeletal:     Right shoulder: Tenderness present.     Left shoulder: Tenderness present.  Skin:    General: Skin is warm.  Neurological:     General: No focal deficit present.     Mental Status: She is alert and oriented to person, place, and time.     Motor: Motor function is intact.      Gait: Gait is intact.  Psychiatric:        Behavior: Behavior normal.    BP 119/82   Pulse (!) 140   Temp 98 F (36.7 C)   Ht '5\' 2"'  (1.575 m)   Wt 212 lb 3.2 oz (96.3 kg)   LMP 08/30/2020   SpO2 97%   BMI 38.81 kg/m  Wt Readings from Last 3 Encounters:  04/08/21 212 lb 3.2 oz (96.3 kg)  04/02/21 213 lb 4.8 oz (96.8 kg)  04/01/21 213 lb (96.6 kg)    Health Maintenance Due  Topic Date Due   OPHTHALMOLOGY EXAM  Never done   URINE MICROALBUMIN  Never done   COVID-19 Vaccine (3 - Booster for Moderna series) 08/11/2020    There are no preventive care reminders to display for this patient.   Lab Results  Component Value Date   TSH 3.620 01/26/2021   Lab Results  Component Value Date   WBC 8.8 04/01/2021   HGB 10.9 (L) 04/01/2021   HCT 34.0 04/01/2021   MCV 90 04/01/2021   PLT 305 04/01/2021   Lab Results  Component Value Date   NA 139 04/01/2021   K 3.8 04/01/2021   CO2 17 (L) 04/01/2021   GLUCOSE 44 (L) 04/01/2021   BUN 4 (L) 04/01/2021   CREATININE 0.58 04/01/2021   BILITOT 0.3 04/01/2021   ALKPHOS 147 (H) 04/01/2021   AST 20 04/01/2021   ALT 29 04/01/2021   PROT 5.7 (L) 04/01/2021   ALBUMIN 3.0 (L) 04/01/2021   CALCIUM 8.5 (L) 04/01/2021   ANIONGAP 11 12/05/2019   EGFR 133 04/01/2021   GFR 88.02 08/15/2018   No results found for: CHOL No results found for: HDL No results found for: LDLCALC No results found for: TRIG No results found for: CHOLHDL Lab Results  Component Value Date   HGBA1C 7.4 (H) 12/10/2020       Assessment & Plan:   Problem List Items Addressed This Visit       Other   Dizziness - Primary    Worsening dizziness, pain between shoulder blades not well controlled.  Patient is [redacted] weeks pregnant.  Completed finger stick blood glucose, and urine protein. I sent patient to [facility] health emergency department. Patient is high risk.      Relevant Orders   Glucose, Random   Protein, urine, random     No orders of the  defined types were placed in this encounter.    Ivy Lynn, NP

## 2021-04-09 DIAGNOSIS — O24013 Pre-existing diabetes mellitus, type 1, in pregnancy, third trimester: Secondary | ICD-10-CM | POA: Diagnosis not present

## 2021-04-09 DIAGNOSIS — E109 Type 1 diabetes mellitus without complications: Secondary | ICD-10-CM | POA: Diagnosis not present

## 2021-04-09 DIAGNOSIS — Z3A31 31 weeks gestation of pregnancy: Secondary | ICD-10-CM | POA: Diagnosis not present

## 2021-04-09 DIAGNOSIS — O99283 Endocrine, nutritional and metabolic diseases complicating pregnancy, third trimester: Secondary | ICD-10-CM | POA: Diagnosis not present

## 2021-04-09 DIAGNOSIS — M549 Dorsalgia, unspecified: Secondary | ICD-10-CM

## 2021-04-09 DIAGNOSIS — O99891 Other specified diseases and conditions complicating pregnancy: Secondary | ICD-10-CM | POA: Diagnosis not present

## 2021-04-09 DIAGNOSIS — E039 Hypothyroidism, unspecified: Secondary | ICD-10-CM

## 2021-04-09 LAB — T4, FREE: Free T4: 0.74 ng/dL (ref 0.61–1.12)

## 2021-04-09 LAB — PROTEIN / CREATININE RATIO, URINE
Creatinine, Urine: 161.6 mg/dL
Protein, Ur: 16.2 mg/dL
Protein/Creat Ratio: 100 mg/g creat (ref 0–200)

## 2021-04-09 LAB — GLUCOSE, CAPILLARY
Glucose-Capillary: 115 mg/dL — ABNORMAL HIGH (ref 70–99)
Glucose-Capillary: 46 mg/dL — ABNORMAL LOW (ref 70–99)

## 2021-04-13 ENCOUNTER — Other Ambulatory Visit: Payer: Self-pay | Admitting: Obstetrics & Gynecology

## 2021-04-13 DIAGNOSIS — O24013 Pre-existing diabetes mellitus, type 1, in pregnancy, third trimester: Secondary | ICD-10-CM

## 2021-04-14 ENCOUNTER — Ambulatory Visit (INDEPENDENT_AMBULATORY_CARE_PROVIDER_SITE_OTHER): Payer: Medicaid Other

## 2021-04-14 ENCOUNTER — Other Ambulatory Visit: Payer: Self-pay

## 2021-04-14 ENCOUNTER — Ambulatory Visit (INDEPENDENT_AMBULATORY_CARE_PROVIDER_SITE_OTHER): Payer: Medicaid Other | Admitting: Obstetrics & Gynecology

## 2021-04-14 ENCOUNTER — Encounter: Payer: Self-pay | Admitting: Obstetrics & Gynecology

## 2021-04-14 ENCOUNTER — Other Ambulatory Visit: Payer: Medicaid Other

## 2021-04-14 VITALS — BP 136/82 | HR 119 | Wt 215.0 lb

## 2021-04-14 DIAGNOSIS — Z3A32 32 weeks gestation of pregnancy: Secondary | ICD-10-CM

## 2021-04-14 DIAGNOSIS — O0993 Supervision of high risk pregnancy, unspecified, third trimester: Secondary | ICD-10-CM

## 2021-04-14 DIAGNOSIS — O24013 Pre-existing diabetes mellitus, type 1, in pregnancy, third trimester: Secondary | ICD-10-CM

## 2021-04-14 DIAGNOSIS — E1065 Type 1 diabetes mellitus with hyperglycemia: Secondary | ICD-10-CM

## 2021-04-14 NOTE — Progress Notes (Signed)
HIGH-RISK PREGNANCY VISIT Patient name: Linda [name] MRN [id number]  Date of birth: April 12, 2001 Chief Complaint:   Routine Prenatal Visit (U/S)  History of Present Illness:   Linda [name] is a 20 y.o. G69P0000 female at [redacted]w[redacted]d with an Estimated Date of Delivery: 06/06/21 being seen today for ongoing management of a high-risk pregnancy complicated by Class C DM acceptable control with occasional high CBG, sonogram is normal.    Today she reports no complaints. Contractions: Not present. Vag. Bleeding: None.  Movement: Present. denies leaking of fluid.   Depression screen [facility] 2/9 04/08/2021 04/01/2021 02/03/2021 12/10/2020 10/15/2020  Decreased Interest 1 0 1 2 2   Down, Depressed, Hopeless 1 0 0 0 2  PHQ - 2 Score 2 0 1 2 4   Altered sleeping 2 0 2 3 3   Tired, decreased energy 3 0 3 3 3   Change in appetite 0 0 0 1 1  Feeling bad or failure about yourself  0 0 0 0 1  Trouble concentrating 2 0 1 1 3   Moving slowly or fidgety/restless 0 0 0 0 2  Suicidal thoughts 0 0 0 0 0  PHQ-9 Score 9 0 7 10 17   Difficult doing work/chores Somewhat difficult - Somewhat difficult - -  Some recent data might be hidden     GAD 7 : Generalized Anxiety Score 04/08/2021 04/01/2021 02/03/2021 12/10/2020  Nervous, Anxious, on Edge 2 0 1 1  Control/stop worrying 2 0 1 1  Worry too much - different things 2 0 1 1  Trouble relaxing 3 0 2 2  Restless 3 0 0 3  Easily annoyed or irritable 3 0 3 2  Afraid - awful might happen 2 0 1 1  Total GAD 7 Score 17 0 9 11  Anxiety Difficulty Somewhat difficult - Somewhat difficult -     Review of Systems:   Pertinent items are noted in HPI Denies abnormal vaginal discharge w/ itching/odor/irritation, headaches, visual changes, shortness of breath, chest pain, abdominal pain, severe nausea/vomiting, or problems with urination or bowel movements unless otherwise stated above. Pertinent History Reviewed:  Reviewed past medical,surgical, social, obstetrical and family  history.  Reviewed problem list, medications and allergies. Physical Assessment:   Vitals:   04/14/21 1415  BP: 136/82  Pulse: (!) 119  Weight: 215 lb (97.5 kg)  Body mass index is 39.32 kg/m.           Physical Examination:   General appearance: alert, well appearing, and in no distress  Mental status: alert, oriented to person, place, and time  Skin: warm & dry   Extremities: Edema: None    Cardiovascular: normal heart rate noted  Respiratory: normal respiratory effort, no distress  Abdomen: gravid, soft, non-tender  Pelvic: Cervical exam deferred         Fetal Status:     Movement: Present    Fetal Surveillance Testing today: BPP 8/8 with normal fluid   Chaperone: N/A    No results found for this or any previous visit (from the past 24 hour(s)).  Assessment & Plan:  High-risk pregnancy: G1P0000 at [redacted]w[redacted]d with an Estimated Date of Delivery: 06/06/21   1) Class C DM, trying to manage diet better, stable, overall contol ok but she is a little sensitive   2) Panic episode, strategies discussed  Meds: No orders of the defined types were placed in this encounter.   Labs/procedures today: 04/03/2021  Treatment Plan:  twice weekly visits  Reviewed: Preterm labor symptoms and  general obstetric precautions including but not limited to vaginal bleeding, contractions, leaking of fluid and fetal movement were reviewed in detail with the patient.  All questions were answered. Does have home bp cuff. Office bp cuff given: not applicable. Check bp daily, let us know if consistently >140 and/or >90.  Follow-up: No follow-ups on file.   Future Appointments  Date Time Provider Department Center  04/17/2021 10:10 AM [facility] NURSE [facility]  04/21/2021  2:00 PM [facility] [place] [facility] None  04/21/2021  2:50 PM Ozan, Victorino Dike, DO [facility]  04/24/2021 10:10 AM [facility] NURSE [facility]  04/28/2021  1:30 PM [facility] [place] [facility] None  04/28/2021  2:30 PM Cheral Marker, CNM [facility]  05/01/2021 10:10 AM [facility] NURSE [facility]  05/05/2021 10:45 AM [facility] [place] [facility] None  05/05/2021 11:30 AM Cheral Marker, CNM [facility]  05/08/2021 10:10 AM [facility] NURSE [facility]  05/12/2021  1:30 PM [facility] [place] [facility] None  05/12/2021  2:30 PM Cheral Marker, CNM [facility]  05/15/2021 10:10 AM [facility] NURSE [facility]  05/19/2021  1:30 PM [facility] [place] [facility] None  05/19/2021  2:30 PM Cheral Marker, CNM [facility]  05/22/2021 10:10 AM [facility] NURSE [facility]  05/26/2021  1:30 PM [facility] [place] [facility] None  05/26/2021  2:30 PM Lazaro Arms, MD [facility]  06/02/2021  2:15 PM [facility] [place] [facility] None  06/02/2021  3:10 PM Myna Hidalgo, DO [facility]  06/05/2021 10:10 AM [facility] NURSE [facility]    No orders of the defined types were placed in this encounter.  Lazaro Arms  04/14/2021 3:10 PM

## 2021-04-14 NOTE — Progress Notes (Signed)
[place] 32+3 wks,breech,cx 3.1 cm,anterior placenta gr 2,AFI 20 cm,FHR 174 bpm,BPP 8/8,EFW 2009 g 45%

## 2021-04-17 ENCOUNTER — Ambulatory Visit (INDEPENDENT_AMBULATORY_CARE_PROVIDER_SITE_OTHER): Payer: Medicaid Other | Admitting: *Deleted

## 2021-04-17 ENCOUNTER — Other Ambulatory Visit: Payer: Self-pay

## 2021-04-17 VITALS — BP 134/85 | HR 110 | Wt 216.0 lb

## 2021-04-17 DIAGNOSIS — Z331 Pregnant state, incidental: Secondary | ICD-10-CM | POA: Diagnosis not present

## 2021-04-17 DIAGNOSIS — Z1389 Encounter for screening for other disorder: Secondary | ICD-10-CM

## 2021-04-17 DIAGNOSIS — O0993 Supervision of high risk pregnancy, unspecified, third trimester: Secondary | ICD-10-CM

## 2021-04-17 DIAGNOSIS — O24019 Pre-existing diabetes mellitus, type 1, in pregnancy, unspecified trimester: Secondary | ICD-10-CM

## 2021-04-17 DIAGNOSIS — O288 Other abnormal findings on antenatal screening of mother: Secondary | ICD-10-CM

## 2021-04-17 LAB — POCT URINALYSIS DIPSTICK OB
Blood, UA: NEGATIVE
Glucose, UA: NEGATIVE
Leukocytes, UA: NEGATIVE
Nitrite, UA: NEGATIVE
POC,PROTEIN,UA: NEGATIVE

## 2021-04-17 NOTE — Progress Notes (Addendum)
   NURSE VISIT- NST  SUBJECTIVE:  Linda [name] is a 20 y.o. G1P0000 female at [redacted]w[redacted]d, here for a NST for pregnancy complicated by T1DM: Class C, currently on Metformin and Insulin .  She reports active fetal movement, contractions: none, vaginal bleeding: none, membranes: intact.   OBJECTIVE:  BP 134/85   Pulse (!) 110   Wt 216 lb (98 kg)   LMP 08/30/2020   BMI 39.51 kg/m   Appears well, no apparent distress  Results for orders placed or performed in visit on 04/17/21 (from the past 24 hour(s))  POC Urinalysis Dipstick OB   Collection Time: 04/17/21 10:23 AM  Result Value Ref Range   Color, UA     Clarity, UA     Glucose, UA Negative Negative   Bilirubin, UA     Ketones, UA trace    Spec Grav, UA     Blood, UA neg    pH, UA     POC,PROTEIN,UA Negative Negative, Trace, Small (1+), Moderate (2+), Large (3+), 4+   Urobilinogen, UA     Nitrite, UA neg    Leukocytes, UA Negative Negative   Appearance     Odor      NST: FHR baseline 130 bpm, Variability: moderate, Accelerations:present, Decelerations:  Absent= Cat 1/reactive Toco: none   ASSESSMENT: G1P0000 at [redacted]w[redacted]d with T1DM: Class C, currently on Metformin and Insulin NST reactive  PLAN: EFM strip reviewed by Dr. Despina Hidden   Recommendations: keep next appointment as scheduled    [name]  04/17/2021 11:41 AM   Attestation of Attending Supervision of Nursing Visit Encounter: Evaluation and management procedures were performed by the nursing staff under my supervision and collaboration.  I have reviewed the nurse's note and chart, and I agree with the management and plan.  Rockne Coons MD Attending Physician for the Center for [facility] 04/19/2021 5:35 AM

## 2021-04-20 ENCOUNTER — Other Ambulatory Visit: Payer: Self-pay | Admitting: Obstetrics & Gynecology

## 2021-04-20 DIAGNOSIS — O24013 Pre-existing diabetes mellitus, type 1, in pregnancy, third trimester: Secondary | ICD-10-CM

## 2021-04-20 DIAGNOSIS — E109 Type 1 diabetes mellitus without complications: Secondary | ICD-10-CM | POA: Diagnosis not present

## 2021-04-21 ENCOUNTER — Encounter: Payer: Self-pay | Admitting: Obstetrics & Gynecology

## 2021-04-21 ENCOUNTER — Other Ambulatory Visit: Payer: Medicaid Other

## 2021-04-21 ENCOUNTER — Other Ambulatory Visit: Payer: Self-pay

## 2021-04-21 ENCOUNTER — Ambulatory Visit (INDEPENDENT_AMBULATORY_CARE_PROVIDER_SITE_OTHER): Payer: Medicaid Other | Admitting: Obstetrics & Gynecology

## 2021-04-21 ENCOUNTER — Ambulatory Visit (INDEPENDENT_AMBULATORY_CARE_PROVIDER_SITE_OTHER): Payer: Medicaid Other

## 2021-04-21 VITALS — BP 121/81 | HR 112 | Wt 216.8 lb

## 2021-04-21 DIAGNOSIS — O24013 Pre-existing diabetes mellitus, type 1, in pregnancy, third trimester: Secondary | ICD-10-CM | POA: Diagnosis not present

## 2021-04-21 DIAGNOSIS — O24319 Unspecified pre-existing diabetes mellitus in pregnancy, unspecified trimester: Secondary | ICD-10-CM

## 2021-04-21 DIAGNOSIS — Z3A33 33 weeks gestation of pregnancy: Secondary | ICD-10-CM

## 2021-04-21 DIAGNOSIS — O0993 Supervision of high risk pregnancy, unspecified, third trimester: Secondary | ICD-10-CM

## 2021-04-21 NOTE — Progress Notes (Signed)
[place] 33+3 wks,cephalic,fhr 138 bpm,AFI  15 cm,BPP 8/8,anterior placenta gr 2

## 2021-04-21 NOTE — Progress Notes (Signed)
HIGH-RISK PREGNANCY VISIT Patient name: Linda [name] MRN [id number]  Date of birth: [birth year] Chief Complaint:   Routine Prenatal Visit  History of Present Illness:   Linda [name] is a 20 y.o. G49P0000 female at [redacted]w[redacted]d with an Estimated Date of Delivery: 06/06/21 being seen today for ongoing management of a high-risk pregnancy complicated by: Class C DM: Current meds: Triseba 60u, 1-10 SSI, 30u with meals  -Dep/Anxiety- on Pristiq/Buspar -Hypothryoidism- on synthroid  Today she reports no complaints. Notes that her lows and highs have seemed to improve.   Contractions: Not present. Vag. Bleeding: None.  Movement: Present. denies leaking of fluid.   Depression screen [facility] 2/9 04/08/2021 04/01/2021 02/03/2021 12/10/2020 10/15/2020  Decreased Interest 1 0 1 2 2   Down, Depressed, Hopeless 1 0 0 0 2  PHQ - 2 Score 2 0 1 2 4   Altered sleeping 2 0 2 3 3   Tired, decreased energy 3 0 3 3 3   Change in appetite 0 0 0 1 1  Feeling bad or failure about yourself  0 0 0 0 1  Trouble concentrating 2 0 1 1 3   Moving slowly or fidgety/restless 0 0 0 0 2  Suicidal thoughts 0 0 0 0 0  PHQ-9 Score 9 0 7 10 17   Difficult doing work/chores Somewhat difficult - Somewhat difficult - -  Some recent data might be hidden     Current Outpatient Medications  Medication Instructions   aspirin 81 mg, Oral, Daily, Swallow whole.   BD PEN NEEDLE NANO 2ND GEN 32G X 4 MM MISC SMARTSIG:Injection   Cholecalciferol (VITAMIN D3 PO) Oral, Daily   Continuous Blood Gluc Sensor (DEXCOM G6 SENSOR) MISC SMARTSIG:Topical Every 10 Days   glucagon 1 mg, Intramuscular, Once PRN   levothyroxine (SYNTHROID) 25 mcg, Oral, Daily before breakfast   loratadine (CLARITIN) 10 mg, Oral, Daily   MELATONIN PO Oral, As needed   metFORMIN (GLUCOPHAGE) 500 mg, Oral, 2 times daily with meals   NOVOLOG FLEXPEN 100 UNIT/ML FlexPen Patient taking 120units   omeprazole (PRILOSEC) 20 mg, Oral, Daily   Prenatal Vit-Fe  Fumarate-FA (PRENATAL VITAMIN PO) 1 tablet, Oral, Daily   promethazine (PHENERGAN) 25 mg, Oral, Every 6 hours PRN   sodium chloride (OCEAN) 0.65 % SOLN nasal spray 1 spray, Each Nare, As needed   Tresiba FlexTouch 65 Units, Daily     Review of Systems:   Pertinent items are noted in HPI Denies abnormal vaginal discharge w/ itching/odor/irritation, headaches, visual changes, shortness of breath, chest pain, abdominal pain, severe nausea/vomiting, or problems with urination or bowel movements unless otherwise stated above. Pertinent History Reviewed:  Reviewed past medical,surgical, social, obstetrical and family history.  Reviewed problem list, medications and allergies. Physical Assessment:   Vitals:   04/21/21 1438  BP: 121/81  Pulse: (!) 112  Weight: 216 lb 12.8 oz (98.3 kg)  Body mass index is 39.65 kg/m.           Physical Examination:   General appearance: alert, well appearing, and in no distress  Mental status: alert, oriented to person, place, and time  Skin: warm & dry   Extremities: Edema: Trace    Cardiovascular: normal heart rate noted  Respiratory: normal respiratory effort, no distress  Abdomen: gravid, soft, non-tender  Pelvic: Cervical exam deferred         Fetal Status: Fetal Heart Rate (bpm): 138 by   Movement: Present    Fetal Surveillance Testing today: BPP- cephalic,fhr 138 bpm,AFI  15 cm,BPP 8/8,anterior placenta gr 2   Chaperone: N/A    Assessment & Plan:  High-risk pregnancy: G1P0000 at [redacted]w[redacted]d with an Estimated Date of Delivery: 06/06/21   1) Class C DM -no change to current insulin regimen, doing better- less hypoglycemic episodes in am -BPP 8/8  2) Hypothyroidism []  TSH check due for this trimester (advise anytime b/w 34-36wk)  Meds: No orders of the defined types were placed in this encounter.   Labs/procedures today: BPP  Treatment Plan:  continue current OB care with twice weekly antepartum testing and growth q 4wks  Reviewed:  Preterm labor symptoms and general obstetric precautions including but not limited to vaginal bleeding, contractions, leaking of fluid and fetal movement were reviewed in detail with the patient.  All questions were answered. Pt has home bp cuff. Check bp weekly, let know if >140/90.   Follow-up: No follow-ups on file.   Future Appointments  Date Time Provider Department Center  04/24/2021 10:10 AM [facility] NURSE [facility]  04/28/2021  1:30 PM [facility] 04/30/2021 [facility] None  04/28/2021  2:30 PM 04/30/2021, CNM [facility]  05/01/2021 10:10 AM [facility] NURSE [facility]  05/05/2021 10:45 AM [facility] 13/07/2020 [facility] None  05/05/2021 11:30 AM 13/07/2020, CNM [facility]  05/08/2021 10:10 AM [facility] NURSE [facility]  05/12/2021  1:30 PM [facility] 13/02/2021 [facility] None  05/12/2021  2:30 PM 13/02/2021, CNM [facility]  05/15/2021 10:10 AM [facility] NURSE [facility]  05/19/2021  1:30 PM [facility] 05/21/2021 [facility] None  05/19/2021  2:30 PM 05/21/2021, CNM [facility]  05/22/2021 10:10 AM [facility] NURSE [facility]  05/26/2021  1:30 PM [facility] 05/28/2021 [facility] None  05/26/2021  2:30 PM 05/28/2021, MD [facility]  06/02/2021  2:15 PM [facility] 06/04/2021 [facility] None  06/02/2021  3:10 PM 06/04/2021, DO [facility]  06/05/2021 10:10 AM [facility] NURSE [facility]    No orders of the defined types were placed in this encounter.   14/08/2020, DO Attending Obstetrician & Gynecologist, [facility] for [facility]

## 2021-04-24 ENCOUNTER — Ambulatory Visit (INDEPENDENT_AMBULATORY_CARE_PROVIDER_SITE_OTHER): Payer: Medicaid Other | Admitting: *Deleted

## 2021-04-24 ENCOUNTER — Other Ambulatory Visit: Payer: Self-pay

## 2021-04-24 DIAGNOSIS — E1065 Type 1 diabetes mellitus with hyperglycemia: Secondary | ICD-10-CM | POA: Diagnosis not present

## 2021-04-24 DIAGNOSIS — O0993 Supervision of high risk pregnancy, unspecified, third trimester: Secondary | ICD-10-CM

## 2021-04-24 NOTE — Progress Notes (Addendum)
   NURSE VISIT- NST  SUBJECTIVE:  Linda [name] is a 20 y.o. G1P0000 female at [redacted]w[redacted]d, here for a NST for pregnancy complicated by T1DM: Class C, currently on Insulin .  She reports active fetal movement, contractions: none, vaginal bleeding: none, membranes: intact.   OBJECTIVE:  BP 138/82   Pulse (!) 104   Wt 216 lb (98 kg)   LMP 08/30/2020   BMI 39.51 kg/m   Appears well, no apparent distress  No results found for this or any previous visit (from the past 24 hour(s)).  NST: FHR baseline 135 bpm, Variability: moderate, Accelerations:present, Decelerations:  Absent= Cat 1/reactive Toco: none   ASSESSMENT: G1P0000 at [redacted]w[redacted]d with T1DM: Class C, currently on Insulin NST reactive  PLAN: EFM strip reviewed by [name], CNM   Recommendations: keep next appointment as scheduled    [name]  04/24/2021 11:41 AM  Chart reviewed for nurse visit. Agree with plan of care.  [name], CNM 04/24/2021 12:24 PM

## 2021-04-28 ENCOUNTER — Other Ambulatory Visit: Payer: Self-pay

## 2021-04-28 ENCOUNTER — Ambulatory Visit (INDEPENDENT_AMBULATORY_CARE_PROVIDER_SITE_OTHER): Payer: Medicaid Other | Admitting: Obstetrics & Gynecology

## 2021-04-28 ENCOUNTER — Other Ambulatory Visit: Payer: Medicaid Other

## 2021-04-28 ENCOUNTER — Encounter: Payer: Medicaid Other | Admitting: Women's Health

## 2021-04-28 ENCOUNTER — Encounter: Payer: Self-pay | Admitting: Obstetrics & Gynecology

## 2021-04-28 VITALS — BP 119/79 | HR 124 | Wt 215.2 lb

## 2021-04-28 DIAGNOSIS — O99283 Endocrine, nutritional and metabolic diseases complicating pregnancy, third trimester: Secondary | ICD-10-CM | POA: Diagnosis not present

## 2021-04-28 DIAGNOSIS — O24013 Pre-existing diabetes mellitus, type 1, in pregnancy, third trimester: Secondary | ICD-10-CM | POA: Diagnosis not present

## 2021-04-28 DIAGNOSIS — E039 Hypothyroidism, unspecified: Secondary | ICD-10-CM | POA: Diagnosis not present

## 2021-04-28 DIAGNOSIS — Z794 Long term (current) use of insulin: Secondary | ICD-10-CM | POA: Diagnosis not present

## 2021-04-28 DIAGNOSIS — Z3A35 35 weeks gestation of pregnancy: Secondary | ICD-10-CM | POA: Diagnosis not present

## 2021-04-28 DIAGNOSIS — E559 Vitamin D deficiency, unspecified: Secondary | ICD-10-CM | POA: Diagnosis not present

## 2021-04-28 DIAGNOSIS — O0993 Supervision of high risk pregnancy, unspecified, third trimester: Secondary | ICD-10-CM | POA: Diagnosis not present

## 2021-04-28 DIAGNOSIS — E10649 Type 1 diabetes mellitus with hypoglycemia without coma: Secondary | ICD-10-CM | POA: Diagnosis not present

## 2021-04-28 DIAGNOSIS — Z3A34 34 weeks gestation of pregnancy: Secondary | ICD-10-CM

## 2021-04-28 LAB — POCT URINALYSIS DIPSTICK OB
Blood, UA: NEGATIVE
Glucose, UA: NEGATIVE
Ketones, UA: NEGATIVE
Leukocytes, UA: NEGATIVE
Nitrite, UA: NEGATIVE
POC,PROTEIN,UA: NEGATIVE

## 2021-04-28 NOTE — Progress Notes (Signed)
HIGH-RISK PREGNANCY VISIT Patient name: Linda [name] MRN [id number]  Date of birth: 11/25/2000 Chief Complaint:   Routine Prenatal Visit, High Risk Gestation, and Non-stress Test  History of Present Illness:   Linda [name] is a 20 y.o. G58P0000 female at [redacted]w[redacted]d with an Estimated Date of Delivery: 06/06/21 being seen today for ongoing management of a high-risk pregnancy  complicated by:  Class C DM Triseba 65u daily Novolog 30u with meals  Starting to note elevated after 1st meal  -Dep/Anxiety- on Pristiq/Buspar -Hypothryoidism- on synthroid  Today she reports no complaints.   Contractions: Irritability. Vag. Bleeding: None.  Movement: Present. denies leaking of fluid.   Depression screen [facility] 2/9 04/08/2021 04/01/2021 02/03/2021 12/10/2020 10/15/2020  Decreased Interest 1 0 1 2 2   Down, Depressed, Hopeless 1 0 0 0 2  PHQ - 2 Score 2 0 1 2 4   Altered sleeping 2 0 2 3 3   Tired, decreased energy 3 0 3 3 3   Change in appetite 0 0 0 1 1  Feeling bad or failure about yourself  0 0 0 0 1  Trouble concentrating 2 0 1 1 3   Moving slowly or fidgety/restless 0 0 0 0 2  Suicidal thoughts 0 0 0 0 0  PHQ-9 Score 9 0 7 10 17   Difficult doing work/chores Somewhat difficult - Somewhat difficult - -  Some recent data might be hidden     Current Outpatient Medications  Medication Instructions   aspirin 81 mg, Oral, Daily, Swallow whole.   BD PEN NEEDLE NANO 2ND GEN 32G X 4 MM MISC SMARTSIG:Injection   Cholecalciferol (VITAMIN D3 PO) Oral, Daily   Continuous Blood Gluc Sensor (DEXCOM G6 SENSOR) MISC SMARTSIG:Topical Every 10 Days   glucagon 1 mg, Intramuscular, Once PRN   levothyroxine (SYNTHROID) 25 mcg, Oral, Daily before breakfast   loratadine (CLARITIN) 10 mg, Oral, Daily   MELATONIN PO Oral, As needed   metFORMIN (GLUCOPHAGE) 500 mg, Oral, 2 times daily with meals   NOVOLOG FLEXPEN 100 UNIT/ML FlexPen Patient taking 120units   omeprazole (PRILOSEC) 20 mg, Oral,  Daily   Prenatal Vit-Fe Fumarate-FA (PRENATAL VITAMIN PO) 1 tablet, Oral, Daily   promethazine (PHENERGAN) 25 mg, Oral, Every 6 hours PRN   sodium chloride (OCEAN) 0.65 % SOLN nasal spray 1 spray, Each Nare, As needed   Tresiba FlexTouch 60 Units, Daily     Review of Systems:   Pertinent items are noted in HPI Denies abnormal vaginal discharge w/ itching/odor/irritation, headaches, visual changes, shortness of breath, chest pain, abdominal pain, severe nausea/vomiting, or problems with urination or bowel movements unless otherwise stated above. Pertinent History Reviewed:  Reviewed past medical,surgical, social, obstetrical and family history.  Reviewed problem list, medications and allergies. Physical Assessment:   Vitals:   04/28/21 0937  BP: 119/79  Pulse: (!) 124  Weight: 215 lb 3.2 oz (97.6 kg)  Body mass index is 39.36 kg/m.           Physical Examination:   General appearance: alert, well appearing, and in no distress  Mental status: alert, oriented to person, place, and time  Skin: warm & dry   Extremities: Edema: Trace    Cardiovascular: normal heart rate noted  Respiratory: normal respiratory effort, no distress  Abdomen: gravid, soft, non-tender  Pelvic: Cervical exam deferred         Fetal Status:     Movement: Present    Fetal Surveillance Testing today: NST   NST being performed due  to Class C DM  Fetal Monitoring:  Baseline: 130 bpm, Variability: moderate, Accelerations: present, The accelerations are >15 bpm and more than 2 in 20 minutes, and Decelerations: Absent     Final diagnosis:  Reactive NST  Chaperone: N/A    Results for orders placed or performed in visit on 04/28/21 (from the past 24 hour(s))  POC Urinalysis Dipstick OB   Collection Time: 04/28/21  9:49 AM  Result Value Ref Range   Color, UA     Clarity, UA     Glucose, UA Negative Negative   Bilirubin, UA     Ketones, UA neg    Spec Grav, UA     Blood, UA neg    pH, UA      POC,PROTEIN,UA Negative Negative, Trace, Small (1+), Moderate (2+), Large (3+), 4+   Urobilinogen, UA     Nitrite, UA neg    Leukocytes, UA Negative Negative   Appearance     Odor       Assessment & Plan:  High-risk pregnancy: G1P0000 at [redacted]w[redacted]d with an Estimated Date of Delivery: 06/06/21   Class C DM Triseba 65u daily Novolog 30u with meals Increase to 32 up to 34 with 1st meal  -Dep/Anxiety- on Pristiq/Buspar -Hypothryoidism- on synthroid , on 10/5- elevated TSH, normal t4,  []  repeat levels today   Meds: No orders of the defined types were placed in this encounter.   Labs/procedures today: thyroid panel, NST  Treatment Plan:  continue twice weekly testing, IOL @ 39wk  Reviewed: Preterm labor symptoms and general obstetric precautions including but not limited to vaginal bleeding, contractions, leaking of fluid and fetal movement were reviewed in detail with the patient.  All questions were answered. Pt has home bp cuff. Check bp weekly, let know if >140/90.   Follow-up: Return in about 1 week (around 05/05/2021) for HROB visit- as scheduled.   Future Appointments  Date Time Provider Department Center  05/01/2021 10:10 AM [facility] NURSE [facility]  05/05/2021 10:30 AM [facility] 13/07/2020 [facility] None  05/05/2021 11:30 AM 13/07/2020, CNM [facility]  05/08/2021 10:10 AM [facility] NURSE [facility]  05/12/2021  1:30 PM [facility] 13/02/2021 [facility] None  05/12/2021  2:30 PM 13/02/2021, CNM [facility]  05/15/2021 10:10 AM [facility] NURSE [facility]  05/19/2021  1:30 PM [facility] 05/21/2021 [facility] None  05/19/2021  2:30 PM 05/21/2021, CNM [facility]  05/22/2021 10:10 AM [facility] NURSE [facility]  05/26/2021  1:30 PM [facility] 05/28/2021 [facility] None  05/26/2021  2:30 PM 05/28/2021, MD [facility]  06/02/2021  2:15 PM [facility] 06/04/2021 [facility] None  06/02/2021  3:10 PM 06/04/2021, DO [facility]   06/05/2021 10:10 AM [facility] NURSE [facility]    Orders Placed This Encounter  Procedures   Thyroid Panel With TSH   POC Urinalysis Dipstick OB    14/08/2020, DO Attending Obstetrician & Gynecologist, [facility] Hidalgo, [facility]

## 2021-04-29 ENCOUNTER — Telehealth: Payer: Self-pay | Admitting: Obstetrics & Gynecology

## 2021-04-29 LAB — THYROID PANEL WITH TSH
Free Thyroxine Index: 1.8 (ref 1.2–4.9)
T3 Uptake Ratio: 15 % — ABNORMAL LOW (ref 24–39)
T4, Total: 12.1 ug/dL — ABNORMAL HIGH (ref 4.5–12.0)
TSH: 5.28 u[IU]/mL — ABNORMAL HIGH (ref 0.450–4.500)

## 2021-04-30 ENCOUNTER — Other Ambulatory Visit: Payer: Self-pay

## 2021-04-30 ENCOUNTER — Inpatient Hospital Stay (HOSPITAL_COMMUNITY)
Admission: AD | Admit: 2021-04-30 | Discharge: 2021-04-30 | Disposition: A | Discharge status: Home / Self Care | Payer: Medicaid Other | Attending: Obstetrics and Gynecology | Admitting: Obstetrics and Gynecology

## 2021-04-30 ENCOUNTER — Encounter (HOSPITAL_COMMUNITY): Payer: Self-pay | Admitting: Obstetrics and Gynecology

## 2021-04-30 DIAGNOSIS — Z79899 Other long term (current) drug therapy: Secondary | ICD-10-CM | POA: Diagnosis not present

## 2021-04-30 DIAGNOSIS — R109 Unspecified abdominal pain: Secondary | ICD-10-CM

## 2021-04-30 DIAGNOSIS — R1031 Right lower quadrant pain: Secondary | ICD-10-CM | POA: Insufficient documentation

## 2021-04-30 DIAGNOSIS — O0993 Supervision of high risk pregnancy, unspecified, third trimester: Secondary | ICD-10-CM

## 2021-04-30 DIAGNOSIS — O26893 Other specified pregnancy related conditions, third trimester: Secondary | ICD-10-CM | POA: Diagnosis not present

## 2021-04-30 DIAGNOSIS — Z3A34 34 weeks gestation of pregnancy: Secondary | ICD-10-CM

## 2021-04-30 DIAGNOSIS — Z3689 Encounter for other specified antenatal screening: Secondary | ICD-10-CM | POA: Insufficient documentation

## 2021-04-30 DIAGNOSIS — R1032 Left lower quadrant pain: Secondary | ICD-10-CM | POA: Insufficient documentation

## 2021-04-30 DIAGNOSIS — Z87891 Personal history of nicotine dependence: Secondary | ICD-10-CM | POA: Diagnosis not present

## 2021-04-30 DIAGNOSIS — R102 Pelvic and perineal pain: Secondary | ICD-10-CM | POA: Diagnosis not present

## 2021-04-30 LAB — URINALYSIS, ROUTINE W REFLEX MICROSCOPIC
Bilirubin Urine: NEGATIVE
Glucose, UA: NEGATIVE mg/dL
Hgb urine dipstick: NEGATIVE
Ketones, ur: 5 mg/dL — AB
Leukocytes,Ua: NEGATIVE
Nitrite: NEGATIVE
Protein, ur: NEGATIVE mg/dL
Specific Gravity, Urine: 1.005 (ref 1.005–1.030)
pH: 6 (ref 5.0–8.0)

## 2021-04-30 MED ORDER — CYCLOBENZAPRINE HCL 5 MG PO TABS
10.0000 mg | ORAL_TABLET | Freq: Once | ORAL | Status: AC
  Administered 2021-04-30: 10 mg via ORAL
  Filled 2021-04-30: qty 2

## 2021-04-30 MED ORDER — ACETAMINOPHEN 500 MG PO TABS
1000.0000 mg | ORAL_TABLET | Freq: Once | ORAL | Status: AC
  Administered 2021-04-30: 1000 mg via ORAL
  Filled 2021-04-30: qty 2

## 2021-04-30 NOTE — MAU Provider Note (Addendum)
Chief Complaint:  Abdominal Pain   Event Date/Time   First Provider Initiated Contact with Patient 04/30/21 0735     HPI: Linda [name] is a 20 y.o. G1P0000 at 85w5dwho presents to maternity admissions reporting cramping.  Started about midnight She reports good fetal movement (hurts when baby kicks),  denies LOF, vaginal bleeding, vaginal itching/burning, urinary symptoms, h/a, dizziness, n/v, diarrhea, constipation or fever/chills.  Abdominal Pain This is a new problem. The current episode started today. The problem occurs intermittently. The problem is unchanged. The pain is located in the LLQ, RLQ and suprapubic region. The quality of the pain is described as sharp. The pain does not radiate. Pertinent negatives include no diarrhea, dysuria, fever, frequency, nausea or vomiting. The symptoms are relieved by palpation, movement and certain positions. Past treatments include nothing.       Past Medical History: Past Medical History:  Diagnosis Date   Allergy    Anxiety    Asthma    Carpal tunnel syndrome    Depression    Diabetes mellitus without complication (HCC) 2014   Type 1   Tachycardia    Tinnitus     Past obstetric history: OB History  Gravida Para Term Preterm AB Living  1 0 0 0 0 0  SAB IAB Ectopic Multiple Live Births  0 0 0 0 0    # Outcome Date GA Lbr Len/2nd Weight Sex Delivery Anes PTL Lv  1 Current             Past Surgical History: Past Surgical History:  Procedure Laterality Date   ADENOIDECTOMY     TONSILLECTOMY     WISDOM TOOTH EXTRACTION      Family History: Family History  Problem Relation Age of Onset   Hypertension Mother    Thyroid disease Mother    Asthma Brother    Thyroid disease Brother    COPD Paternal Grandmother    Hypertension Paternal Grandmother    COPD Maternal Grandmother    Lumbar disc disease Maternal Grandmother    Dementia Maternal Grandmother    Heart attack Maternal Grandfather    Emphysema Maternal  Grandfather    Ankylosing spondylitis Maternal Grandfather    Dementia Maternal Grandfather     Social History: Social History   Tobacco Use   Smoking status: Never    Passive exposure: Yes   Smokeless tobacco: Never  Vaping Use   Vaping Use: Former  Substance Use Topics   Alcohol use: No   Drug use: No    Allergies: No Known Allergies  Meds:  Medications Prior to Admission  Medication Sig Dispense Refill Last Dose   aspirin 81 MG EC tablet Take 1 tablet (81 mg total) by mouth daily. Swallow whole. 90 tablet 3 04/29/2021   Cholecalciferol (VITAMIN D3 PO) Take by mouth daily.   04/29/2021   levothyroxine (SYNTHROID) 25 MCG tablet Take 1 tablet (25 mcg total) by mouth daily before breakfast. 90 tablet 3 04/29/2021   omeprazole (PRILOSEC) 20 MG capsule Take 1 capsule (20 mg total) by mouth daily. 7 capsule 0 04/29/2021   Prenatal Vit-Fe Fumarate-FA (PRENATAL VITAMIN PO) Take 1 tablet by mouth daily.   04/29/2021   BD PEN NEEDLE NANO 2ND GEN 32G X 4 MM MISC SMARTSIG:Injection      Continuous Blood Gluc Sensor (DEXCOM G6 SENSOR) MISC SMARTSIG:Topical Every 10 Days      glucagon 1 MG injection Inject 1 mg into the muscle once as needed (for blood sugar).  insulin degludec (TRESIBA FLEXTOUCH) 100 UNIT/ML FlexTouch Pen 60 Units daily.      loratadine (CLARITIN) 10 MG tablet Take 10 mg by mouth daily.      MELATONIN PO Take by mouth as needed.      metFORMIN (GLUCOPHAGE) 500 MG tablet Take 500 mg by mouth 2 (two) times daily with a meal.      NOVOLOG FLEXPEN 100 UNIT/ML FlexPen Patient taking 120units      promethazine (PHENERGAN) 25 MG tablet Take 1 tablet (25 mg total) by mouth every 6 (six) hours as needed for nausea or vomiting. 30 tablet 1    sodium chloride (OCEAN) 0.65 % SOLN nasal spray Place 1 spray into both nostrils as needed for congestion. 30 mL 2     I have reviewed patient's Past Medical Hx, Surgical Hx, Family Hx, Social Hx, medications and allergies.   ROS:   Review of Systems  Constitutional:  Negative for fever.  Gastrointestinal:  Positive for abdominal pain. Negative for diarrhea, nausea and vomiting.  Genitourinary:  Negative for dysuria and frequency.  Other systems negative  Physical Exam  No data found. Constitutional: Well-developed, well-nourished female in no acute distress.  Cardiovascular: normal rate and rhythm Respiratory: normal effort, clear to auscultation bilaterally GI: Abd soft, non-tender, gravid appropriate for gestational age.   No rebound or guarding. MS: Extremities nontender, no edema, normal ROM Neurologic: Alert and oriented x 4.  GU: Neg CVAT.  PELVIC EXAM:   Cervix closed/30%/ballotable  FHT:  Baseline 140 , moderate variability, accelerations present, no decelerations Contractions:    Labs:  A/Positive/-- (06/08 1541)  Imaging:    MAU Course/MDM: I have ordered UA and EFM NST reviewed, reassuring  Assessment: Single IUP at [redacted]w[redacted]d Lower abdominal pain   Plan: Report given to oncoming provider   Wynelle Bourgeois CNM, MSN Certified Nurse-Midwife 04/30/2021 7:36 AM  Reassessment (9:07 AM) -Nurse reports patient with improvement of intermittent pain, now 2/10, and constant pain has subsided. -Patient to follow up as scheduled. -Encouraged to call primary office or return to MAU if symptoms worsen or with the onset of new symptoms. -Discharged to home in improved condition.  Cherre Robins MSN, CNM Advanced Practice Provider, [facility]

## 2021-04-30 NOTE — MAU Note (Signed)
...  Linda [name] is a 20 y.o. at [redacted]w[redacted]d here in MAU reporting: lower abdominal cramping since midnight last night. She states she is experiencing constant pain that she rates a 4/10 and intermittent abdominal pain that radiates up through her entire stomach that she rates 8/10. +FM. No VB or LOF.   Last IC last night at 2100.  FHT: 145 initial external Lab orders placed from triage: UA

## 2021-05-01 ENCOUNTER — Ambulatory Visit (INDEPENDENT_AMBULATORY_CARE_PROVIDER_SITE_OTHER): Payer: Medicaid Other | Admitting: *Deleted

## 2021-05-01 VITALS — BP 117/74 | HR 116 | Wt 213.5 lb

## 2021-05-01 DIAGNOSIS — Z3A34 34 weeks gestation of pregnancy: Secondary | ICD-10-CM

## 2021-05-01 DIAGNOSIS — O24019 Pre-existing diabetes mellitus, type 1, in pregnancy, unspecified trimester: Secondary | ICD-10-CM | POA: Diagnosis not present

## 2021-05-01 DIAGNOSIS — O0993 Supervision of high risk pregnancy, unspecified, third trimester: Secondary | ICD-10-CM | POA: Diagnosis not present

## 2021-05-01 LAB — POCT URINALYSIS DIPSTICK OB
Blood, UA: NEGATIVE
Glucose, UA: NEGATIVE
Leukocytes, UA: NEGATIVE
Nitrite, UA: NEGATIVE
POC,PROTEIN,UA: NEGATIVE

## 2021-05-01 NOTE — Progress Notes (Addendum)
   NURSE VISIT- NST  SUBJECTIVE:  Linda [name] is a 20 y.o. 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  She reports active fetal movement, contractions: none, vaginal bleeding: none, membranes: intact.   OBJECTIVE:  BP 117/74   Pulse (!) 116   Wt 213 lb 8 oz (96.8 kg)   LMP 08/30/2020   BMI 39.05 kg/m   Appears well, no apparent distress  Results for orders placed or performed in visit on 05/01/21 (from the past 24 hour(s))  POC Urinalysis Dipstick OB   Collection Time: 05/01/21 10:08 AM  Result Value Ref Range   Color, UA     Clarity, UA     Glucose, UA Negative Negative   Bilirubin, UA     Ketones, UA 4+    Spec Grav, UA     Blood, UA neg    pH, UA     POC,PROTEIN,UA Negative Negative, Trace, Small (1+), Moderate (2+), Large (3+), 4+   Urobilinogen, UA     Nitrite, UA neg    Leukocytes, UA Negative Negative   Appearance     Odor      NST: FHR baseline 140 bpm, Variability: moderate, Accelerations:present, Decelerations:  Absent= Cat 1 /reactive Toco: none   ASSESSMENT: G1P0000 at [redacted]w[redacted]d with T1DM: Class C, currently on Insulin, Novolog and tresiba. NST reactive  PLAN: EFM strip reviewed by Dr. Charlotta [name]   Recommendations: keep next appointment as scheduled    Linda [name]  05/01/2021 11:12 AM  Chart reviewed for nurse visit. Agree with plan of care.  Linda Hidalgo, DO 05/02/2021 11:07 AM

## 2021-05-04 ENCOUNTER — Other Ambulatory Visit: Payer: Self-pay | Admitting: Obstetrics & Gynecology

## 2021-05-04 DIAGNOSIS — O24013 Pre-existing diabetes mellitus, type 1, in pregnancy, third trimester: Secondary | ICD-10-CM

## 2021-05-05 ENCOUNTER — Ambulatory Visit (INDEPENDENT_AMBULATORY_CARE_PROVIDER_SITE_OTHER): Payer: Medicaid Other

## 2021-05-05 ENCOUNTER — Encounter: Payer: Self-pay | Admitting: Obstetrics & Gynecology

## 2021-05-05 ENCOUNTER — Other Ambulatory Visit: Payer: Medicaid Other

## 2021-05-05 ENCOUNTER — Ambulatory Visit (INDEPENDENT_AMBULATORY_CARE_PROVIDER_SITE_OTHER): Payer: Medicaid Other | Admitting: Obstetrics & Gynecology

## 2021-05-05 ENCOUNTER — Other Ambulatory Visit: Payer: Self-pay

## 2021-05-05 VITALS — BP 117/80 | HR 111 | Wt 217.5 lb

## 2021-05-05 DIAGNOSIS — O0993 Supervision of high risk pregnancy, unspecified, third trimester: Secondary | ICD-10-CM

## 2021-05-05 DIAGNOSIS — O24013 Pre-existing diabetes mellitus, type 1, in pregnancy, third trimester: Secondary | ICD-10-CM | POA: Diagnosis not present

## 2021-05-05 DIAGNOSIS — Z3A35 35 weeks gestation of pregnancy: Secondary | ICD-10-CM | POA: Diagnosis not present

## 2021-05-05 LAB — POCT URINALYSIS DIPSTICK OB
Blood, UA: NEGATIVE
Glucose, UA: NEGATIVE
Ketones, UA: NEGATIVE
Leukocytes, UA: NEGATIVE
Nitrite, UA: NEGATIVE
POC,PROTEIN,UA: NEGATIVE

## 2021-05-05 NOTE — Progress Notes (Signed)
[place] 35+3 wks,cephalic,BPP 8/8,CX 3 cm,anterior placenta gr 3,AFI 16.8 cm,FHR 133 BPM

## 2021-05-05 NOTE — Progress Notes (Signed)
HIGH-RISK PREGNANCY VISIT Patient name: Linda [name] MRN [id number]  Date of birth: October 19, 2000 Chief Complaint:   High Risk Gestation ([place] today)  History of Present Illness:   Linda [name] is a 20 y.o. G1P0000 female at [redacted]w[redacted]d with an Estimated Date of Delivery: 06/06/21 being seen today for ongoing management of a high-risk pregnancy complicated by Class C DM on triseba and humolog.    Today she reports no complaints. Contractions: Irritability. Vag. Bleeding: None.  Movement: Present. denies leaking of fluid.   Depression screen [facility] 2/9 04/08/2021 04/01/2021 02/03/2021 12/10/2020 10/15/2020  Decreased Interest 1 0 1 2 2   Down, Depressed, Hopeless 1 0 0 0 2  PHQ - 2 Score 2 0 1 2 4   Altered sleeping 2 0 2 3 3   Tired, decreased energy 3 0 3 3 3   Change in appetite 0 0 0 1 1  Feeling bad or failure about yourself  0 0 0 0 1  Trouble concentrating 2 0 1 1 3   Moving slowly or fidgety/restless 0 0 0 0 2  Suicidal thoughts 0 0 0 0 0  PHQ-9 Score 9 0 7 10 17   Difficult doing work/chores Somewhat difficult - Somewhat difficult - -  Some recent data might be hidden     GAD 7 : Generalized Anxiety Score 04/08/2021 04/01/2021 02/03/2021 12/10/2020  Nervous, Anxious, on Edge 2 0 1 1  Control/stop worrying 2 0 1 1  Worry too much - different things 2 0 1 1  Trouble relaxing 3 0 2 2  Restless 3 0 0 3  Easily annoyed or irritable 3 0 3 2  Afraid - awful might happen 2 0 1 1  Total GAD 7 Score 17 0 9 11  Anxiety Difficulty Somewhat difficult - Somewhat difficult -     Review of Systems:   Pertinent items are noted in HPI Denies abnormal vaginal discharge w/ itching/odor/irritation, headaches, visual changes, shortness of breath, chest pain, abdominal pain, severe nausea/vomiting, or problems with urination or bowel movements unless otherwise stated above. Pertinent History Reviewed:  Reviewed past medical,surgical, social, obstetrical and family history.  Reviewed problem list,  medications and allergies. Physical Assessment:   Vitals:   05/05/21 1116  BP: 117/80  Pulse: (!) 111  Weight: 217 lb 8 oz (98.7 kg)  Body mass index is 39.78 kg/m.           Physical Examination:   General appearance: alert, well appearing, and in no distress  Mental status: alert, oriented to person, place, and time  Skin: warm & dry   Extremities: Edema: None    Cardiovascular: normal heart rate noted  Respiratory: normal respiratory effort, no distress  Abdomen: gravid, soft, non-tender  Pelvic: Cervical exam deferred         Fetal Status:     Movement: Present    Fetal Surveillance Testing today: BPP 8/8    Chaperone: N/A    Results for orders placed or performed in visit on 05/05/21 (from the past 24 hour(s))  POC Urinalysis Dipstick OB   Collection Time: 05/05/21 11:17 AM  Result Value Ref Range   Color, UA     Clarity, UA     Glucose, UA Negative Negative   Bilirubin, UA     Ketones, UA neg    Spec Grav, UA     Blood, UA neg    pH, UA     POC,PROTEIN,UA Negative Negative, Trace, Small (1+), Moderate (2+), Large (3+), 4+  Urobilinogen, UA     Nitrite, UA neg    Leukocytes, UA Negative Negative   Appearance     Odor      Assessment & Plan:  High-risk pregnancy: G1P0000 at [redacted]w[redacted]d with an Estimated Date of Delivery: 06/06/21   1) Class C DM, stable, Triseba 60, 1:10, 40 reg with first meal, 30 with second meal  2) Mild hypothyroid, on synthroid,   Meds: No orders of the defined types were placed in this encounter.   Labs/procedures today: none  Treatment Plan:  twice weekly assessment/surveillance  Reviewed: Preterm labor symptoms and general obstetric precautions including but not limited to vaginal bleeding, contractions, leaking of fluid and fetal movement were reviewed in detail with the patient.  All questions were answered. Does have home bp cuff. Office bp cuff given: not applicable. Check bp daily, let us know if consistently >140 and/or  >90.  Follow-up: No follow-ups on file.   Future Appointments  Date Time Provider Department Center  05/08/2021 10:10 AM [facility] NURSE [facility]  05/12/2021  1:30 PM [facility] [place] [facility] None  05/12/2021  2:30 PM Cheral Marker, CNM [facility]  05/15/2021 10:10 AM [facility] NURSE [facility]  05/19/2021  1:30 PM [facility] [place] [facility] None  05/19/2021  2:30 PM Cheral Marker, CNM [facility]  05/22/2021 10:10 AM [facility] NURSE [facility]  05/26/2021  1:30 PM [facility] [place] [facility] None  05/26/2021  2:30 PM Lazaro Arms, MD [facility]  06/02/2021  2:15 PM [facility] [place] [facility] None  06/02/2021  3:10 PM Myna Hidalgo, DO [facility]  06/05/2021 10:10 AM [facility] NURSE [facility]    Orders Placed This Encounter  Procedures   POC Urinalysis Dipstick OB   Linda [name]  05/05/2021 11:58 AM

## 2021-05-08 ENCOUNTER — Ambulatory Visit (INDEPENDENT_AMBULATORY_CARE_PROVIDER_SITE_OTHER): Payer: Medicaid Other | Admitting: *Deleted

## 2021-05-08 ENCOUNTER — Encounter: Payer: Medicaid Other | Admitting: Obstetrics & Gynecology

## 2021-05-08 ENCOUNTER — Other Ambulatory Visit: Payer: Self-pay

## 2021-05-08 ENCOUNTER — Other Ambulatory Visit: Payer: Medicaid Other

## 2021-05-08 VITALS — BP 126/83 | HR 127 | Wt 213.6 lb

## 2021-05-08 DIAGNOSIS — E1065 Type 1 diabetes mellitus with hyperglycemia: Secondary | ICD-10-CM | POA: Diagnosis not present

## 2021-05-08 DIAGNOSIS — O0993 Supervision of high risk pregnancy, unspecified, third trimester: Secondary | ICD-10-CM

## 2021-05-08 DIAGNOSIS — R102 Pelvic and perineal pain: Secondary | ICD-10-CM

## 2021-05-08 LAB — POCT URINALYSIS DIPSTICK OB
Blood, UA: NEGATIVE
Glucose, UA: NEGATIVE
Leukocytes, UA: NEGATIVE
Nitrite, UA: NEGATIVE

## 2021-05-08 NOTE — Progress Notes (Addendum)
   NURSE VISIT- NST  SUBJECTIVE:  Linda [name] is a 20 y.o. G1P0000 female at [redacted]w[redacted]d, here for a NST for pregnancy complicated by T1DM: Class C, currently on Insulin .  She reports active fetal movement, contractions: none, vaginal bleeding: none, membranes: intact.   OBJECTIVE:  BP 126/83   Pulse (!) 127   Wt 213 lb 9.6 oz (96.9 kg)   LMP 08/30/2020   BMI 39.07 kg/m   Appears well, no apparent distress  Results for orders placed or performed in visit on 05/08/21 (from the past 24 hour(s))  POC Urinalysis Dipstick OB   Collection Time: 05/08/21 10:26 AM  Result Value Ref Range   Color, UA     Clarity, UA     Glucose, UA Negative Negative   Bilirubin, UA     Ketones, UA large    Spec Grav, UA     Blood, UA neg    pH, UA     POC,PROTEIN,UA Trace Negative, Trace, Small (1+), Moderate (2+), Large (3+), 4+   Urobilinogen, UA     Nitrite, UA neg    Leukocytes, UA Negative Negative   Appearance     Odor      NST: FHR baseline 140 bpm, Variability: moderate, Accelerations:present, Decelerations:  Absent= Cat 1/reactive Toco: none   ASSESSMENT: G1P0000 at [redacted]w[redacted]d with T1DM: Class C, currently on Insulin. NST reactive  PLAN: EFM strip reviewed by Dr. Despina Hidden   Recommendations: keep next appointment as scheduled    [name]  05/08/2021 11:24 AM   Attestation of Attending Supervision of Nursing Visit Encounter: Evaluation and management procedures were performed by the nursing staff under my supervision and collaboration.  I have reviewed the nurse's note and chart, and I agree with the management and plan.  Rockne Coons MD Attending Physician for the Center [facility] 05/15/2021 10:04 AM

## 2021-05-11 ENCOUNTER — Other Ambulatory Visit: Payer: Self-pay | Admitting: Obstetrics & Gynecology

## 2021-05-11 DIAGNOSIS — O24013 Pre-existing diabetes mellitus, type 1, in pregnancy, third trimester: Secondary | ICD-10-CM

## 2021-05-12 ENCOUNTER — Other Ambulatory Visit (HOSPITAL_COMMUNITY)
Admission: RE | Admit: 2021-05-12 | Discharge: 2021-05-12 | Disposition: A | Discharge status: Home / Self Care | Payer: Medicaid Other | Source: Ambulatory Visit | Attending: Women's Health | Admitting: Women's Health

## 2021-05-12 ENCOUNTER — Ambulatory Visit (INDEPENDENT_AMBULATORY_CARE_PROVIDER_SITE_OTHER): Payer: Medicaid Other | Admitting: Women's Health

## 2021-05-12 ENCOUNTER — Other Ambulatory Visit: Payer: Medicaid Other

## 2021-05-12 ENCOUNTER — Encounter: Payer: Self-pay | Admitting: Women's Health

## 2021-05-12 ENCOUNTER — Ambulatory Visit (INDEPENDENT_AMBULATORY_CARE_PROVIDER_SITE_OTHER): Payer: Medicaid Other

## 2021-05-12 ENCOUNTER — Other Ambulatory Visit: Payer: Self-pay

## 2021-05-12 VITALS — BP 132/80 | HR 109 | Wt 221.2 lb

## 2021-05-12 DIAGNOSIS — O24013 Pre-existing diabetes mellitus, type 1, in pregnancy, third trimester: Secondary | ICD-10-CM

## 2021-05-12 DIAGNOSIS — Z3A36 36 weeks gestation of pregnancy: Secondary | ICD-10-CM | POA: Insufficient documentation

## 2021-05-12 DIAGNOSIS — O0993 Supervision of high risk pregnancy, unspecified, third trimester: Secondary | ICD-10-CM

## 2021-05-12 DIAGNOSIS — O24019 Pre-existing diabetes mellitus, type 1, in pregnancy, unspecified trimester: Secondary | ICD-10-CM

## 2021-05-12 DIAGNOSIS — O0992 Supervision of high risk pregnancy, unspecified, second trimester: Secondary | ICD-10-CM

## 2021-05-12 LAB — POCT URINALYSIS DIPSTICK OB
Blood, UA: NEGATIVE
Glucose, UA: NEGATIVE
Leukocytes, UA: NEGATIVE
Nitrite, UA: NEGATIVE
POC,PROTEIN,UA: NEGATIVE

## 2021-05-12 NOTE — Progress Notes (Signed)
HIGH-RISK PREGNANCY VISIT Patient name: Linda [name] MRN [id number]  Date of birth: Nov 03, 2000 Chief Complaint:   Routine Prenatal Visit (BPP)  History of Present Illness:   Linda [name] is a 20 y.o. G57P0000 female at [redacted]w[redacted]d with an Estimated Date of Delivery: 06/06/21 being seen today for ongoing management of a high-risk pregnancy complicated by diabetes mellitus T2DM: Class C, currently on Triseba 60u daily, regular insulin 40u w/ 1st meal, 30u w/ 2nd meal, metformin 1,000mg  q am.    Today she reports  intermittent pains in vagina/pelvis since getting here . FBS 99, 67, 69, 135, 76, 123 (135 & 123 were after eating snack b/c woke up w/ sugar in 40s), 2hr pp 87-182 (majority >120). Contractions: Irritability. Vag. Bleeding: None.  Movement: Present. denies leaking of fluid.   Depression screen [facility] 2/9 04/08/2021 04/01/2021 02/03/2021 12/10/2020 10/15/2020  Decreased Interest 1 0 1 2 2   Down, Depressed, Hopeless 1 0 0 0 2  PHQ - 2 Score 2 0 1 2 4   Altered sleeping 2 0 2 3 3   Tired, decreased energy 3 0 3 3 3   Change in appetite 0 0 0 1 1  Feeling bad or failure about yourself  0 0 0 0 1  Trouble concentrating 2 0 1 1 3   Moving slowly or fidgety/restless 0 0 0 0 2  Suicidal thoughts 0 0 0 0 0  PHQ-9 Score 9 0 7 10 17   Difficult doing work/chores Somewhat difficult - Somewhat difficult - -  Some recent data might be hidden     GAD 7 : Generalized Anxiety Score 04/08/2021 04/01/2021 02/03/2021 12/10/2020  Nervous, Anxious, on Edge 2 0 1 1  Control/stop worrying 2 0 1 1  Worry too much - different things 2 0 1 1  Trouble relaxing 3 0 2 2  Restless 3 0 0 3  Easily annoyed or irritable 3 0 3 2  Afraid - awful might happen 2 0 1 1  Total GAD 7 Score 17 0 9 11  Anxiety Difficulty Somewhat difficult - Somewhat difficult -     Review of Systems:   Pertinent items are noted in HPI Denies abnormal vaginal discharge w/ itching/odor/irritation, headaches, visual changes, shortness  of breath, chest pain, abdominal pain, severe nausea/vomiting, or problems with urination or bowel movements unless otherwise stated above. Pertinent History Reviewed:  Reviewed past medical,surgical, social, obstetrical and family history.  Reviewed problem list, medications and allergies. Physical Assessment:   Vitals:   05/12/21 1410  BP: 132/80  Pulse: (!) 109  Weight: 221 lb 3.2 oz (100.3 kg)  Body mass index is 40.46 kg/m.           Physical Examination:   General appearance: alert, well appearing, and in no distress  Mental status: alert, oriented to person, place, and time  Skin: warm & dry   Extremities: Edema: None    Cardiovascular: normal heart rate noted  Respiratory: normal respiratory effort, no distress  Abdomen: gravid, soft, non-tender  Pelvic: Cervical exam performed  Dilation: Closed Effacement (%): Thick Station: Ballotable  Fetal Status: Fetal Heart Rate (bpm): 148 u/s   Movement: Present Presentation: Vertex  Fetal Surveillance Testing today: 36+3 wks,cephalic,BPP 8/8,anterior placenta gr 3,AFI 20.9 cm,FHR 148 BPM,EFW 3062 g 66%  Chaperone: 06/08/2021    Results for orders placed or performed in visit on 05/12/21 (from the past 24 hour(s))  POC Urinalysis Dipstick OB   Collection Time: 05/12/21  2:35 PM  Result  Value Ref Range   Color, UA     Clarity, UA     Glucose, UA Negative Negative   Bilirubin, UA     Ketones, UA small    Spec Grav, UA     Blood, UA neg    pH, UA     POC,PROTEIN,UA Negative Negative, Trace, Small (1+), Moderate (2+), Large (3+), 4+   Urobilinogen, UA     Nitrite, UA neg    Leukocytes, UA Negative Negative   Appearance     Odor      Assessment & Plan:  High-risk pregnancy: G1P0000 at [redacted]w[redacted]d with an Estimated Date of Delivery: 06/06/21   1) Class C IDDM, discussed sugars w/ Dr. Despina Hidden, to stay on current regimen- Triseba 60u daily, regular insulin 40u w/ 1st meal, 30u w/ 2nd meal, metformin 1,000mg  q AM. EFW today  66% w/ normal AFI.   2) Hypothyroidism, on synthroid daily, last TSH 5.280, Dr. Charlotta Newton discussed w/ her, plan to repeat in 3-4wks  Meds: No orders of the defined types were placed in this encounter.   Labs/procedures today: GBS, GC/CT, SVE, and U/S  Treatment Plan:  2x/wk testing or weekly BPP     Deliver @ 39wks:_____   Reviewed: Preterm labor symptoms and general obstetric precautions including but not limited to vaginal bleeding, contractions, leaking of fluid and fetal movement were reviewed in detail with the patient.  All questions were answered. Does have home bp cuff. Office bp cuff given: not applicable. Check bp weekly, let us know if consistently >140 and/or >90.  Follow-up: Return for As scheduled.   Future Appointments  Date Time Provider Department Center  05/15/2021 10:10 AM [facility] NURSE [facility]  05/19/2021  1:30 PM [facility] [place] [facility] None  05/19/2021  2:30 PM Cheral Marker, CNM [facility]  05/22/2021 10:10 AM [facility] NURSE [facility]  05/26/2021  1:30 PM [facility] [place] [facility] None  05/26/2021  2:30 PM Lazaro Arms, MD [facility]  06/02/2021  2:15 PM [facility] [place] [facility] None  06/02/2021  3:10 PM Myna Hidalgo, DO [facility]  06/05/2021 10:10 AM [facility] NURSE [facility]    Orders Placed This Encounter  Procedures  . Culture, beta strep (group b only)  . POC Urinalysis Dipstick OB   Cheral Marker CNM, [facility] 05/12/2021 3:01 PM

## 2021-05-12 NOTE — Progress Notes (Signed)
[place] 36+3 wks,cephalic,BPP 8/8,anterior placenta gr 3,AFI 20.9 cm,FHR 148 BPM,EFW 3062 g 66%

## 2021-05-12 NOTE — Patient Instructions (Signed)
Haddie, thank you for choosing our office today! We appreciate the opportunity to meet your healthcare needs. You may receive a short survey by mail, e-mail, or through [employer]. If you are happy with your care we would appreciate if you could take just a few minutes to complete the survey questions. We read all of your comments and take your feedback very seriously. Thank you again for choosing our office.  [facility] [employer] Team at [facility]  [facility] at [facility] ([address] [place], [place] [address]) Entrance C, located off of [address] Free 24/7 valet parking   CLASSES: Go to [email or web address] to register for classes (childbirth, breastfeeding, waterbirth, infant CPR, daddy bootcamp, etc.)  Call the office [phone]) or go to [facility] if: You begin to have strong, frequent contractions Your water breaks.  Sometimes it is a big gush of fluid, sometimes it is just a trickle that keeps getting your panties wet or running down your legs You have vaginal bleeding.  It is normal to have a small amount of spotting if your cervix was checked.  You don't feel your baby moving like normal.  If you don't, get you something to eat and drink and lay down and focus on feeling your baby move.   If your baby is still not moving like normal, you should call the office or go to [facility].  Call the office [phone]) or go to [facility] hospital for these signs of pre-eclampsia: Severe headache that does not go away with Tylenol Visual changes- seeing spots, double, blurred vision Pain under your right breast or upper abdomen that does not go away with Tums or heartburn medicine Nausea and/or vomiting Severe swelling in your hands, feet, and face   Tdap Vaccine It is recommended that you get the Tdap vaccine during the third trimester of EACH pregnancy to help protect your baby from getting pertussis (whooping cough) 27-36 weeks is the BEST time to do  this so that you can pass the protection on to your baby. During pregnancy is better than after pregnancy, but if you are unable to get it during pregnancy it will be offered at the hospital.  You can get this vaccine with [place], at the health department, your family doctor, or some local pharmacies Everyone who will be around your baby should also be up-to-date on their vaccines before the baby comes. Adults (who are not pregnant) only need 1 dose of Tdap during adulthood.   [facility] Pediatricians/Family Doctors [facility]): [address] [place] Rib[place], [phone]           [facility]: [address], [phone]                [facility] Medicine [facility]): [address], [phone] (call to ask if accepting patients) [facility] Department: [address], [place], [phone]    [facility] Pediatricians/Family Doctors [facility]): [phone] [address], [phone] [facility]: [address] [place], [phone] [facility]: [address], [phone]  [facility] Doctors  [facility]): [phone] [facility]: [address] [phone]   [facility] Doctors [facility]: [address], [phone]  [facility] Family Doctors  [facility]: [phone], [phone]  Home Blood Pressure Monitoring for Patients   Your provider has recommended that you check your  blood pressure (BP) at least once a week at home. If you do not have a blood pressure cuff at home, one will be provided for you. Contact your provider if you have not received your monitor within 1 week.   Helpful Tips for Accurate Home Blood Pressure Checks  Don't smoke, exercise, or drink caffeine 30 minutes before checking your BP Use the restroom before checking your BP (a full bladder can raise your  pressure) Relax in a comfortable upright chair Feet on the ground Left arm resting comfortably on a flat surface at the level of your heart Legs uncrossed Back supported Sit quietly and don't talk Place the cuff on your bare arm Adjust snuggly, so that only two fingertips can fit between your skin and the top of the cuff Check 2 readings separated by at least one minute Keep a log of your BP readings For a visual, please reference this diagram: http://ccnc.care/bpdiagram  Provider Name: [facility]     Phone: [phone]  Zone 1: ALL CLEAR  Continue to monitor your symptoms:  BP reading is less than 140 (top number) or less than 90 (bottom number)  No right upper stomach pain No headaches or seeing spots No feeling nauseated or throwing up No swelling in face and hands  Zone 2: CAUTION Call your doctor's office for any of the following:  BP reading is greater than 140 (top number) or greater than 90 (bottom number)  Stomach pain under your ribs in the middle or right side Headaches or seeing spots Feeling nauseated or throwing up Swelling in face and hands  Zone 3: EMERGENCY  Seek immediate medical care if you have any of the following:  BP reading is greater than160 (top number) or greater than 110 (bottom number) Severe headaches not improving with Tylenol Serious difficulty catching your breath Any worsening symptoms from Zone 2  Preterm Labor and Birth Information  The normal length of a pregnancy is 39-41 weeks. Preterm labor is when labor starts before 37 completed weeks of pregnancy. What are the risk factors for preterm labor? Preterm labor is more likely to occur in women who: Have certain infections during pregnancy such as a bladder infection, sexually transmitted infection, or infection inside the uterus (chorioamnionitis). Have a shorter-than-normal cervix. Have gone into preterm labor before. Have had surgery on their cervix. Are younger than age 17  or older than age 35. Are African American. Are pregnant with twins or multiple babies (multiple gestation). Take street drugs or smoke while pregnant. Do not gain enough weight while pregnant. Became pregnant shortly after having been pregnant. What are the symptoms of preterm labor? Symptoms of preterm labor include: Cramps similar to those that can happen during a menstrual period. The cramps may happen with diarrhea. Pain in the abdomen or lower back. Regular uterine contractions that may feel like tightening of the abdomen. A feeling of increased pressure in the pelvis. Increased watery or bloody mucus discharge from the vagina. Water breaking (ruptured amniotic sac). Why is it important to recognize signs of preterm labor? It is important to recognize signs of preterm labor because babies who are born prematurely may not be fully developed. This can put them at an increased risk for: Long-term (chronic) heart and lung problems. Difficulty immediately after birth with regulating body systems, including blood sugar, body temperature, heart rate, and breathing rate. Bleeding in the brain. Cerebral palsy. Learning difficulties. Death. These risks are highest for babies who are born before 34 weeks   of pregnancy. How is preterm labor treated? Treatment depends on the length of your pregnancy, your condition, and the health of your baby. It may involve: Having a stitch (suture) placed in your cervix to prevent your cervix from opening too early (cerclage). Taking or being given medicines, such as: Hormone medicines. These may be given early in pregnancy to help support the pregnancy. Medicine to stop contractions. Medicines to help mature the baby's lungs. These may be prescribed if the risk of delivery is high. Medicines to prevent your baby from developing cerebral palsy. If the labor happens before 34 weeks of pregnancy, you may need to stay in the hospital. What should I do if I  think I am in preterm labor? If you think that you are going into preterm labor, call your health care provider right away. How can I prevent preterm labor in future pregnancies? To increase your chance of having a full-term pregnancy: Do not use any tobacco products, such as cigarettes, chewing tobacco, and e-cigarettes. If you need help quitting, ask your health care provider. Do not use street drugs or medicines that have not been prescribed to you during your pregnancy. Talk with your health care provider before taking any herbal supplements, even if you have been taking them regularly. Make sure you gain a healthy amount of weight during your pregnancy. Watch for infection. If you think that you might have an infection, get it checked right away. Make sure to tell your health care provider if you have gone into preterm labor before. This information is not intended to replace advice given to you by your health care provider. Make sure you discuss any questions you have with your health care provider. Document Revised: 10/13/2018 Document Reviewed: 11/12/2015 Elsevier Patient Education  2020 Elsevier Inc.   

## 2021-05-14 LAB — CERVICOVAGINAL ANCILLARY ONLY
Bacterial Vaginitis (gardnerella): NEGATIVE
Candida Glabrata: NEGATIVE
Candida Vaginitis: NEGATIVE
Chlamydia: NEGATIVE
Comment: NEGATIVE
Comment: NEGATIVE
Comment: NEGATIVE
Comment: NEGATIVE
Comment: NEGATIVE
Comment: NORMAL
Neisseria Gonorrhea: NEGATIVE
Trichomonas: NEGATIVE

## 2021-05-15 ENCOUNTER — Ambulatory Visit (INDEPENDENT_AMBULATORY_CARE_PROVIDER_SITE_OTHER): Payer: Medicaid Other | Admitting: *Deleted

## 2021-05-15 ENCOUNTER — Other Ambulatory Visit: Payer: Medicaid Other

## 2021-05-15 ENCOUNTER — Other Ambulatory Visit: Payer: Self-pay

## 2021-05-15 VITALS — BP 125/82 | HR 113 | Wt 227.0 lb

## 2021-05-15 DIAGNOSIS — O24019 Pre-existing diabetes mellitus, type 1, in pregnancy, unspecified trimester: Secondary | ICD-10-CM | POA: Diagnosis not present

## 2021-05-15 DIAGNOSIS — O288 Other abnormal findings on antenatal screening of mother: Secondary | ICD-10-CM

## 2021-05-15 DIAGNOSIS — Z3A37 37 weeks gestation of pregnancy: Secondary | ICD-10-CM | POA: Diagnosis not present

## 2021-05-15 DIAGNOSIS — Z331 Pregnant state, incidental: Secondary | ICD-10-CM

## 2021-05-15 DIAGNOSIS — O0993 Supervision of high risk pregnancy, unspecified, third trimester: Secondary | ICD-10-CM | POA: Diagnosis not present

## 2021-05-15 DIAGNOSIS — E1065 Type 1 diabetes mellitus with hyperglycemia: Secondary | ICD-10-CM

## 2021-05-15 DIAGNOSIS — Z1389 Encounter for screening for other disorder: Secondary | ICD-10-CM

## 2021-05-15 LAB — POCT URINALYSIS DIPSTICK OB
Blood, UA: NEGATIVE
Glucose, UA: NEGATIVE
Leukocytes, UA: NEGATIVE
Nitrite, UA: NEGATIVE

## 2021-05-16 LAB — PROTEIN / CREATININE RATIO, URINE
Creatinine, Urine: 182.5 mg/dL
Protein, Ur: 29.8 mg/dL
Protein/Creat Ratio: 163 mg/g creat (ref 0–200)

## 2021-05-16 LAB — CULTURE, BETA STREP (GROUP B ONLY): Strep Gp B Culture: NEGATIVE

## 2021-05-18 ENCOUNTER — Other Ambulatory Visit: Payer: Self-pay | Admitting: Women's Health

## 2021-05-18 DIAGNOSIS — O24113 Pre-existing diabetes mellitus, type 2, in pregnancy, third trimester: Secondary | ICD-10-CM

## 2021-05-18 NOTE — Progress Notes (Addendum)
   NURSE VISIT- NST  SUBJECTIVE:  Linda [name] is a 20 y.o. G1P0000 female at [redacted]w[redacted]d, here for a NST for pregnancy complicated by T1DM: Class C, currently on insulin .  She reports active fetal movement, contractions: none, vaginal bleeding: none, membranes: intact.   OBJECTIVE:  BP 125/82   Pulse (!) 113   Wt 227 lb (103 kg)   LMP 08/30/2020   BMI 41.52 kg/m   Appears well, no apparent distress  Urine small ketones  NST: FHR baseline 125 bpm, Variability: moderate, Accelerations:present, Decelerations:  Absent= Cat 1/reactive Toco: none   ASSESSMENT: G1P0000 at [redacted]w[redacted]d with T1DM: Class C, currently on insulin NST reactive  PLAN: EFM strip reviewed by Joellyn Haff, CNM, [facility]   Recommendations: keep next appointment as scheduled    [name]  05/18/2021 9:15 AM  Chart reviewed for nurse visit. Agree with plan of care.  Cheral Marker, [place] 05/18/2021 1:37 PM

## 2021-05-19 ENCOUNTER — Other Ambulatory Visit: Payer: Medicaid Other

## 2021-05-19 ENCOUNTER — Other Ambulatory Visit: Payer: Self-pay

## 2021-05-19 ENCOUNTER — Inpatient Hospital Stay (HOSPITAL_COMMUNITY)
Admission: AD | Admit: 2021-05-19 | Discharge: 2021-05-19 | Disposition: A | Discharge status: Home / Self Care | Payer: Medicaid Other | Attending: Obstetrics and Gynecology | Admitting: Obstetrics and Gynecology

## 2021-05-19 ENCOUNTER — Encounter: Payer: Medicaid Other | Admitting: Women's Health

## 2021-05-19 ENCOUNTER — Ambulatory Visit (INDEPENDENT_AMBULATORY_CARE_PROVIDER_SITE_OTHER): Payer: Medicaid Other

## 2021-05-19 ENCOUNTER — Encounter (HOSPITAL_COMMUNITY): Payer: Self-pay | Admitting: Obstetrics and Gynecology

## 2021-05-19 DIAGNOSIS — Z3689 Encounter for other specified antenatal screening: Secondary | ICD-10-CM | POA: Insufficient documentation

## 2021-05-19 DIAGNOSIS — O471 False labor at or after 37 completed weeks of gestation: Secondary | ICD-10-CM | POA: Diagnosis not present

## 2021-05-19 DIAGNOSIS — Z3A37 37 weeks gestation of pregnancy: Secondary | ICD-10-CM | POA: Diagnosis not present

## 2021-05-19 DIAGNOSIS — O0993 Supervision of high risk pregnancy, unspecified, third trimester: Secondary | ICD-10-CM

## 2021-05-19 DIAGNOSIS — O24113 Pre-existing diabetes mellitus, type 2, in pregnancy, third trimester: Secondary | ICD-10-CM

## 2021-05-19 NOTE — MAU Provider Note (Signed)
S: [name] is a 20 y.o. G1P0000 at [redacted]w[redacted]d  who presents to MAU today for labor evaluation.     Cervical exam by RN:  Dilation: Closed Effacement (%): Thick Station: [employer] Exam by:: [employer], RN  Fetal Monitoring: reactive Baseline: 135 Variability: moderate Accelerations: 15x15 Decelerations: none Contractions: UI  MDM Discussed patient with RN. NST reviewed.   A: SIUP at [redacted]w[redacted]d  False labor with reactive NST  P: Discharge home [facility] information included in AVS Patient to follow-up with [facility] as scheduled  [name] return to MAU as needed or when in labor   Bernerd Limbo, [place] 05/19/2021 3:16 AM

## 2021-05-19 NOTE — MAU Note (Signed)
Ctxs for last couple hours and a lot of pelvic pressure. Denies VB or LOF. Last cervical exam cervix closed

## 2021-05-19 NOTE — Progress Notes (Signed)
[place] 37+3 wks,cephalic,BPP 8/8,anterior placenta gr 3,mild polyhydramnios,AFI 25 cm,FHR 157 bpm

## 2021-05-19 NOTE — Discharge Instructions (Signed)

## 2021-05-21 DIAGNOSIS — E109 Type 1 diabetes mellitus without complications: Secondary | ICD-10-CM | POA: Diagnosis not present

## 2021-05-22 ENCOUNTER — Inpatient Hospital Stay (HOSPITAL_COMMUNITY)
Admission: AD | Admit: 2021-05-22 | Discharge: 2021-05-27 | DRG: 807 | Disposition: A | Discharge status: Home / Self Care | Payer: Medicaid Other | Attending: Obstetrics and Gynecology | Admitting: Obstetrics and Gynecology

## 2021-05-22 ENCOUNTER — Other Ambulatory Visit: Payer: Self-pay

## 2021-05-22 ENCOUNTER — Encounter (HOSPITAL_COMMUNITY): Payer: Self-pay | Admitting: Obstetrics and Gynecology

## 2021-05-22 ENCOUNTER — Other Ambulatory Visit: Payer: Medicaid Other

## 2021-05-22 DIAGNOSIS — O2402 Pre-existing diabetes mellitus, type 1, in childbirth: Secondary | ICD-10-CM | POA: Diagnosis present

## 2021-05-22 DIAGNOSIS — O99284 Endocrine, nutritional and metabolic diseases complicating childbirth: Secondary | ICD-10-CM | POA: Diagnosis not present

## 2021-05-22 DIAGNOSIS — O134 Gestational [pregnancy-induced] hypertension without significant proteinuria, complicating childbirth: Secondary | ICD-10-CM | POA: Diagnosis not present

## 2021-05-22 DIAGNOSIS — E109 Type 1 diabetes mellitus without complications: Secondary | ICD-10-CM | POA: Diagnosis present

## 2021-05-22 DIAGNOSIS — O0990 Supervision of high risk pregnancy, unspecified, unspecified trimester: Secondary | ICD-10-CM

## 2021-05-22 DIAGNOSIS — Z7982 Long term (current) use of aspirin: Secondary | ICD-10-CM

## 2021-05-22 DIAGNOSIS — Z794 Long term (current) use of insulin: Secondary | ICD-10-CM

## 2021-05-22 DIAGNOSIS — O99214 Obesity complicating childbirth: Secondary | ICD-10-CM | POA: Diagnosis not present

## 2021-05-22 DIAGNOSIS — F419 Anxiety disorder, unspecified: Secondary | ICD-10-CM | POA: Diagnosis present

## 2021-05-22 DIAGNOSIS — E039 Hypothyroidism, unspecified: Secondary | ICD-10-CM | POA: Diagnosis present

## 2021-05-22 DIAGNOSIS — O133 Gestational [pregnancy-induced] hypertension without significant proteinuria, third trimester: Secondary | ICD-10-CM

## 2021-05-22 DIAGNOSIS — Z3A38 38 weeks gestation of pregnancy: Secondary | ICD-10-CM

## 2021-05-22 DIAGNOSIS — F4310 Post-traumatic stress disorder, unspecified: Secondary | ICD-10-CM | POA: Diagnosis present

## 2021-05-22 DIAGNOSIS — F410 Panic disorder [episodic paroxysmal anxiety] without agoraphobia: Secondary | ICD-10-CM | POA: Diagnosis not present

## 2021-05-22 DIAGNOSIS — Z20822 Contact with and (suspected) exposure to covid-19: Secondary | ICD-10-CM | POA: Diagnosis present

## 2021-05-22 DIAGNOSIS — F32A Depression, unspecified: Secondary | ICD-10-CM | POA: Diagnosis not present

## 2021-05-22 DIAGNOSIS — Z7984 Long term (current) use of oral hypoglycemic drugs: Secondary | ICD-10-CM

## 2021-05-22 DIAGNOSIS — O99344 Other mental disorders complicating childbirth: Secondary | ICD-10-CM | POA: Diagnosis not present

## 2021-05-22 DIAGNOSIS — Z8759 Personal history of other complications of pregnancy, childbirth and the puerperium: Secondary | ICD-10-CM | POA: Diagnosis present

## 2021-05-22 DIAGNOSIS — O139 Gestational [pregnancy-induced] hypertension without significant proteinuria, unspecified trimester: Secondary | ICD-10-CM | POA: Diagnosis present

## 2021-05-22 LAB — POCT FERN TEST: POCT Fern Test: NEGATIVE

## 2021-05-22 NOTE — MAU Note (Signed)
..  Linda [name] is a 20 y.o. at [redacted]w[redacted]d here in MAU reporting: Reports a gush of fluid an hour ago and leaking since then. Reports occasional contractions. Denies vaginal bleeding. +FM

## 2021-05-22 NOTE — MAU Provider Note (Signed)
S: [name] is a 20 y.o. G1P0000 at [redacted]w[redacted]d  who presents to MAU today complaining of leaking of fluid throughout the day.  She denies vaginal bleeding. She denies contractions. She reports normal fetal movement.  She reports sexual activity earlier today  O: BP 135/79 (BP Location: Right Arm)   Pulse (!) 120   Temp 98.5 F (36.9 C) (Oral)   Resp 20   LMP 08/30/2020   SpO2 99%   Vitals:   05/23/21 0016 05/23/21 0031 05/23/21 0046 05/23/21 0101  BP: 125/89 130/89 (!) 138/97 (!) 141/97  Pulse: (!) 119 (!) 117 (!) 116 (!) 126  Resp:      Temp:      TempSrc:      SpO2:        GENERAL: Well-developed, well-nourished female in no acute distress.  HEAD: Normocephalic, atraumatic.  CHEST: Normal effort of breathing, regular heart rate ABDOMEN: Soft, nontender, gravid PELVIC: Normal external female genitalia. Vagina is pink and rugated. Cervix not visualized d/t position. Normal discharge.  Negative pooling. Fern Collected.  Cervical exam:  Deferred   Fetal Monitoring: FHT: 135 bpm, Mod Var, -Decels, +Accels Toco: No ctx graphed  No results found for this or any previous visit (from the past 24 hour(s)).   A: SIUP at [redacted]w[redacted]d  Membranes intact Elevated BP  P: Fern Negative BP elevated Patient informed that further evaluation to occur. PreE Labs ordered  Gerrit Heck, [place] 05/22/2021 11:24 PM   Reassessment (1:07 AM) PC and CBC ratio returns negative CMP pending BP initially normalizing, but now increasing. L&D team notified of recommendation for admission-agreeable. Patient informed of POC. BSUS confirms vertex presentation.  Cherre Robins MSN, CNM Advanced Practice Provider, [facility] [employer]

## 2021-05-23 DIAGNOSIS — Z8759 Personal history of other complications of pregnancy, childbirth and the puerperium: Secondary | ICD-10-CM | POA: Diagnosis present

## 2021-05-23 DIAGNOSIS — O139 Gestational [pregnancy-induced] hypertension without significant proteinuria, unspecified trimester: Secondary | ICD-10-CM | POA: Diagnosis present

## 2021-05-23 LAB — GLUCOSE, CAPILLARY
Glucose-Capillary: 139 mg/dL — ABNORMAL HIGH (ref 70–99)
Glucose-Capillary: 142 mg/dL — ABNORMAL HIGH (ref 70–99)
Glucose-Capillary: 132 mg/dL — ABNORMAL HIGH (ref 70–99)
Glucose-Capillary: 141 mg/dL — ABNORMAL HIGH (ref 70–99)
Glucose-Capillary: 147 mg/dL — ABNORMAL HIGH (ref 70–99)
Glucose-Capillary: 140 mg/dL — ABNORMAL HIGH (ref 70–99)
Glucose-Capillary: 136 mg/dL — ABNORMAL HIGH (ref 70–99)
Glucose-Capillary: 119 mg/dL — ABNORMAL HIGH (ref 70–99)
Glucose-Capillary: 109 mg/dL — ABNORMAL HIGH (ref 70–99)
Glucose-Capillary: 108 mg/dL — ABNORMAL HIGH (ref 70–99)
Glucose-Capillary: 106 mg/dL — ABNORMAL HIGH (ref 70–99)

## 2021-05-23 LAB — TYPE AND SCREEN
ABO/RH(D): A POS
Antibody Screen: NEGATIVE

## 2021-05-23 LAB — PROTEIN / CREATININE RATIO, URINE
Creatinine, Urine: 436.44 mg/dL
Protein Creatinine Ratio: 0.09 mg/mg{Cre} (ref 0.00–0.15)
Total Protein, Urine: 39 mg/dL

## 2021-05-23 LAB — COMPREHENSIVE METABOLIC PANEL
ALT: 14 U/L (ref 0–44)
ALT: 12 U/L (ref 0–44)
AST: 16 U/L (ref 15–41)
AST: 15 U/L (ref 15–41)
Albumin: 2.1 g/dL — ABNORMAL LOW (ref 3.5–5.0)
Albumin: 1.7 g/dL — ABNORMAL LOW (ref 3.5–5.0)
Alkaline Phosphatase: 162 U/L — ABNORMAL HIGH (ref 38–126)
Alkaline Phosphatase: 138 U/L — ABNORMAL HIGH (ref 38–126)
Anion gap: 8 (ref 5–15)
Anion gap: 8 (ref 5–15)
BUN: 7 mg/dL (ref 6–20)
BUN: 5 mg/dL — ABNORMAL LOW (ref 6–20)
CO2: 21 mmol/L — ABNORMAL LOW (ref 22–32)
CO2: 18 mmol/L — ABNORMAL LOW (ref 22–32)
Calcium: 8.6 mg/dL — ABNORMAL LOW (ref 8.9–10.3)
Calcium: 8.3 mg/dL — ABNORMAL LOW (ref 8.9–10.3)
Chloride: 106 mmol/L (ref 98–111)
Chloride: 108 mmol/L (ref 98–111)
Creatinine, Ser: 1.01 mg/dL — ABNORMAL HIGH (ref 0.44–1.00)
Creatinine, Ser: 0.78 mg/dL (ref 0.44–1.00)
GFR, Estimated: >60 mL/min (ref >60)
GFR, Estimated: >60 mL/min (ref >60)
Glucose, Bld: 166 mg/dL — ABNORMAL HIGH (ref 70–99)
Glucose, Bld: 141 mg/dL — ABNORMAL HIGH (ref 70–99)
Potassium: 4.1 mmol/L (ref 3.5–5.1)
Potassium: 3.3 mmol/L — ABNORMAL LOW (ref 3.5–5.1)
Sodium: 135 mmol/L (ref 135–145)
Sodium: 134 mmol/L — ABNORMAL LOW (ref 135–145)
Total Bilirubin: 0.3 mg/dL (ref 0.3–1.2)
Total Bilirubin: 0.3 mg/dL (ref 0.3–1.2)
Total Protein: 5.7 g/dL — ABNORMAL LOW (ref 6.5–8.1)
Total Protein: 4.8 g/dL — ABNORMAL LOW (ref 6.5–8.1)

## 2021-05-23 LAB — RPR: RPR Ser Ql: NONREACTIVE

## 2021-05-23 LAB — CBC
HCT: 31 % — ABNORMAL LOW (ref 36.0–46.0)
Hemoglobin: 9.6 g/dL — ABNORMAL LOW (ref 12.0–15.0)
MCH: 26.5 pg (ref 26.0–34.0)
MCHC: 31 g/dL (ref 30.0–36.0)
MCV: 85.6 fL (ref 80.0–100.0)
Platelets: 229 10*3/uL (ref 150–400)
RBC: 3.62 MIL/uL — ABNORMAL LOW (ref 3.87–5.11)
RDW: 13.3 % (ref 11.5–15.5)
WBC: 6.5 10*3/uL (ref 4.0–10.5)
nRBC: 0 % (ref 0.0–0.2)

## 2021-05-23 LAB — RESP PANEL BY RT-PCR (FLU A&B, COVID) ARPGX2
Influenza A by PCR: NEGATIVE
Influenza B by PCR: NEGATIVE
SARS Coronavirus 2 by RT PCR: NEGATIVE

## 2021-05-23 MED ORDER — LACTATED RINGERS IV SOLN: INTRAVENOUS | Status: DC

## 2021-05-23 MED ORDER — ACETAMINOPHEN 325 MG PO TABS: 650.0000 mg | ORAL_TABLET | ORAL | Status: DC | PRN

## 2021-05-23 MED ORDER — FENTANYL CITRATE (PF) 100 MCG/2ML IJ SOLN
100.0000 ug | INTRAMUSCULAR | Status: DC | PRN
  Administered 2021-05-23: 100 ug via INTRAVENOUS
  Filled 2021-05-23: qty 2

## 2021-05-23 MED ORDER — MISOPROSTOL 50MCG HALF TABLET
50.0000 ug | ORAL_TABLET | ORAL | Status: DC | PRN
  Administered 2021-05-23 (×2): 50 ug via BUCCAL
  Filled 2021-05-23 (×2): qty 1

## 2021-05-23 MED ORDER — OXYCODONE-ACETAMINOPHEN 5-325 MG PO TABS: 2.0000 | ORAL_TABLET | ORAL | Status: DC | PRN

## 2021-05-23 MED ORDER — OXYCODONE-ACETAMINOPHEN 5-325 MG PO TABS: 1.0000 | ORAL_TABLET | ORAL | Status: DC | PRN

## 2021-05-23 MED ORDER — OXYTOCIN BOLUS FROM INFUSION
333.0000 mL | Freq: Once | INTRAVENOUS | Status: AC
  Administered 2021-05-25: 333 mL via INTRAVENOUS

## 2021-05-23 MED ORDER — SODIUM CHLORIDE 0.9 % IV SOLN
12.5000 mg | Freq: Four times a day (QID) | INTRAVENOUS | Status: DC | PRN
  Administered 2021-05-23: 12.5 mg via INTRAVENOUS
  Filled 2021-05-23: qty 0.5

## 2021-05-23 MED ORDER — DEXTROSE 50 % IV SOLN
0.0000 mL | INTRAVENOUS | Status: DC | PRN
  Administered 2021-05-24: 25 mL via INTRAVENOUS
  Filled 2021-05-23: qty 50

## 2021-05-23 MED ORDER — SOD CITRATE-CITRIC ACID 500-334 MG/5ML PO SOLN
30.0000 mL | ORAL | Status: DC | PRN
  Administered 2021-05-24: 30 mL via ORAL
  Filled 2021-05-23 (×2): qty 30

## 2021-05-23 MED ORDER — TERBUTALINE SULFATE 1 MG/ML IJ SOLN: 0.2500 mg | Freq: Once | INTRAMUSCULAR | Status: DC | PRN

## 2021-05-23 MED ORDER — ONDANSETRON HCL 4 MG/2ML IJ SOLN
4.0000 mg | Freq: Four times a day (QID) | INTRAMUSCULAR | Status: DC | PRN
  Administered 2021-05-24 – 2021-05-25 (×2): 4 mg via INTRAVENOUS
  Filled 2021-05-23 (×2): qty 2

## 2021-05-23 MED ORDER — LIDOCAINE HCL (PF) 1 % IJ SOLN
30.0000 mL | INTRAMUSCULAR | Status: AC | PRN
  Administered 2021-05-25: 30 mL via SUBCUTANEOUS
  Filled 2021-05-23: qty 30

## 2021-05-23 MED ORDER — INSULIN REGULAR(HUMAN) IN NACL 100-0.9 UT/100ML-% IV SOLN
INTRAVENOUS | Status: DC
  Administered 2021-05-23: 2.6 [IU]/h via INTRAVENOUS
  Administered 2021-05-24: 1.1 [IU]/h via INTRAVENOUS
  Administered 2021-05-25: 1.7 [IU]/h via INTRAVENOUS
  Administered 2021-05-25: 1 [IU]/h via INTRAVENOUS
  Filled 2021-05-23 (×2): qty 100

## 2021-05-23 MED ORDER — MISOPROSTOL 25 MCG QUARTER TABLET
25.0000 ug | ORAL_TABLET | ORAL | Status: DC | PRN
  Administered 2021-05-23 (×2): 25 ug via VAGINAL
  Filled 2021-05-23 (×2): qty 1

## 2021-05-23 MED ORDER — DEXTROSE IN LACTATED RINGERS 5 % IV SOLN: INTRAVENOUS | Status: DC

## 2021-05-23 MED ORDER — LACTATED RINGERS IV SOLN: 500.0000 mL | INTRAVENOUS | Status: DC | PRN

## 2021-05-23 MED ORDER — OXYTOCIN-SODIUM CHLORIDE 30-0.9 UT/500ML-% IV SOLN
2.5000 [IU]/h | INTRAVENOUS | Status: DC
  Filled 2021-05-23: qty 500

## 2021-05-23 NOTE — Progress Notes (Signed)
Inpatient Diabetes Program Recommendations  AACE/ADA: New Consensus Statement on Inpatient Glycemic Control (2015)  Target Ranges:  Prepandial:   less than 140 mg/dL      Peak postprandial:   less than 180 mg/dL (1-2 hours)      Critically ill patients:  140 - 180 mg/dL   Lab Results  Component Value Date   GLUCAP 140 (H) 05/23/2021   HGBA1C 7.4 (H) 12/10/2020    Review of Glycemic Control  Latest Reference Range & Units 05/23/21 05:21 05/23/21 06:17 05/23/21 07:50 05/23/21 08:53  Glucose-Capillary 70 - 99 mg/dL 025 (H) 427 (H) 062 (H) 140 (H)   Diabetes history: DM 1 Outpatient Diabetes medications:  Tresiba 60 units daily, Metformin 500 mg bid, Novolog 1 unit:5 grams of CHO (prior to pregnancy)-  Current orders for Inpatient glycemic control:  IV insulin (goal 90-120)  Inpatient Diabetes Program Recommendations:    Note patient admitted for IOL.  Agree with IV insulin.  Patient has history of Type 1 DM.  She will need IV insulin until after delivery.   -When patient is transitioned off insulin drip, she will need basal insulin 2 hours prior to d/c of insulin drip.  Consider Semglee 30 units 2 hours prior to d/c of insulin drip and Novolog sensitive q 4 hours.  Once eating consistently, will likely need meal coverage as well.  Consider Novolog 6-8 units with meals.  Will follow.   Thanks,  Beryl Meager, RN, BC-ADM Inpatient Diabetes Coordinator Pager [phone]  (8a-5p)

## 2021-05-23 NOTE — H&P (Signed)
OBSTETRIC ADMISSION HISTORY AND PHYSICAL  Linda [name] is a 20 y.o. female G1P0000 with IUP at [redacted]w[redacted]d by LMP presenting for IOL due to gestational hypertension. She initially presented to the MAU for leakage of fluid/occasional contractions, with ROM not found, however noted to have persistently elevated Bps.  She reports +FMs, no VB, no blurry vision (reports chronic history of floaters not increased recently), headaches or peripheral edema, and RUQ pain.  She plans on breast feeding. She request POPs for birth control. She received her prenatal care at [facility]   Dating: By LMP (c/s 10 wk [place]) --->  Estimated Date of Delivery: 06/06/21  Sono:    @[redacted]w[redacted]d , CWD, normal anatomy, cephalic presentation, [id number], 66% EFW   Prenatal History/Complications:  --Pre-existing T1DM, follows with [facility]. Recent A1c 5.5% in 02/2021. Current reigmen: Triseba 60U, regular insulin 40U with first meal, 30U second meal, and metformin 1000mg  daily. Pre-pregnancy: 1:5 ratio of novolog for meals, 55U tresiba.   --Hypothyroid: Started on synthroid during pregnancy   --Depression/anxiety/panic attacks: On pristiq and buspar  --Mild intermittent asthma: no controller   Past Medical History: Past Medical History:  Diagnosis Date   Allergy    Anxiety    Asthma    Carpal tunnel syndrome    Depression    Diabetes mellitus without complication ([place]) 123456   Type 1   Tachycardia    Tinnitus     Past Surgical History: Past Surgical History:  Procedure Laterality Date   ADENOIDECTOMY     TONSILLECTOMY     WISDOM TOOTH EXTRACTION      Obstetrical History: OB History     Gravida  1   Para  0   Term  0   Preterm  0   AB  0   Living  0      SAB  0   IAB  0   Ectopic  0   Multiple  0   Live Births  0           Social History Social History   Socioeconomic History   Marital status: Soil scientist    Spouse name: Not on file   Number of children: Not on  file   Years of education: Not on file   Highest education level: Not on file  Occupational History   Not on file  Tobacco Use   Smoking status: Never    Passive exposure: Yes   Smokeless tobacco: Never  Vaping Use   Vaping Use: Former  Substance and Sexual Activity   Alcohol use: No   Drug use: No   Sexual activity: Yes    Birth control/protection: None  Other Topics Concern   Not on file  Social History Narrative   Not on file   Social Determinants of Health   Financial Resource Strain: Low Risk    Difficulty of Paying Living Expenses: Not hard at all  Food Insecurity: No Food Insecurity   Worried About Charity fundraiser in the Last Year: Never true   [place] in the Last Year: Never true  Transportation Needs: No Transportation Needs   Lack of Transportation (Medical): No   Lack of Transportation (Non-Medical): No  Physical Activity: Inactive   Days of Exercise per Week: 0 days   Minutes of Exercise per Session: 0 min  Stress: Stress Concern Present   Feeling of Stress : Very much  Social Connections: Moderately Isolated   Frequency of Communication with Friends and  Family: More than three times a week   Frequency of Social Gatherings with Friends and Family: More than three times a week   Attends Religious Services: Never   Marine scientist or Organizations: No   Attends Music therapist: Never   Marital Status: Living with partner    Family History: Family History  Problem Relation Age of Onset   Hypertension Mother    Thyroid disease Mother    Asthma Brother    Thyroid disease Brother    COPD Paternal Grandmother    Hypertension Paternal Grandmother    COPD Maternal Grandmother    Lumbar disc disease Maternal Grandmother    Dementia Maternal Grandmother    Heart attack Maternal Grandfather    Emphysema Maternal Grandfather    Ankylosing spondylitis Maternal Grandfather    Dementia Maternal Grandfather     Allergies: No  Known Allergies  Medications Prior to Admission  Medication Sig Dispense Refill Last Dose   aspirin 81 MG EC tablet Take 1 tablet (81 mg total) by mouth daily. Swallow whole. 90 tablet 3 05/21/2021   Cholecalciferol (VITAMIN D3 PO) Take by mouth daily.   05/21/2021   levothyroxine (SYNTHROID) 25 MCG tablet Take 1 tablet (25 mcg total) by mouth daily before breakfast. 90 tablet 3 05/21/2021   metFORMIN (GLUCOPHAGE) 500 MG tablet Take 500 mg by mouth 2 (two) times daily with a meal.   05/21/2021   omeprazole (PRILOSEC) 20 MG capsule Take 1 capsule (20 mg total) by mouth daily. 7 capsule 0 05/21/2021   Prenatal Vit-Fe Fumarate-FA (PRENATAL VITAMIN PO) Take 1 tablet by mouth daily.   05/21/2021   BD PEN NEEDLE NANO 2ND GEN 32G X 4 MM MISC SMARTSIG:Injection      Continuous Blood Gluc Sensor (DEXCOM G6 SENSOR) MISC SMARTSIG:Topical Every 10 Days      glucagon 1 MG injection Inject 1 mg into the muscle once as needed (for blood sugar).      insulin degludec (TRESIBA FLEXTOUCH) 100 UNIT/ML FlexTouch Pen 60 Units daily.      loratadine (CLARITIN) 10 MG tablet Take 10 mg by mouth daily.      MELATONIN PO Take by mouth as needed.      NOVOLOG FLEXPEN 100 UNIT/ML FlexPen Patient taking 120units      promethazine (PHENERGAN) 25 MG tablet Take 1 tablet (25 mg total) by mouth every 6 (six) hours as needed for nausea or vomiting. 30 tablet 1    sodium chloride (OCEAN) 0.65 % SOLN nasal spray Place 1 spray into both nostrils as needed for congestion. 30 mL 2      Review of Systems   All systems reviewed and negative except as stated in HPI  Blood pressure (!) 141/97, pulse (!) 126, temperature 98.5 F (36.9 C), temperature source Oral, resp. rate 20, last menstrual period 08/30/2020, SpO2 98 %. General appearance: alert, cooperative, and no distress Lungs: Normal WOB  Heart: regular rate and rhythm Abdomen: soft, non-tender Pelvic: NEFG Extremities: Homans sign is negative, no sign of  DVT Presentation: cephalic Fetal monitoringBaseline: 135 bpm, Variability: Good {> 6 bpm), Accelerations: Reactive, and Decelerations: Absent Uterine activity irregular, now every 2-6      Prenatal labs: ABO, Rh: A/Positive/-- (06/08 1541) Antibody: Negative (09/08 1631) Rubella: 8.88 (06/08 1541) RPR: Non Reactive (09/08 1631)  HBsAg: Negative (06/08 1541)  HIV: Non Reactive (09/08 1631)  GBS: Negative/-- (11/08 1623)  Genetic screening  LR female  Anatomy US normal   Prenatal Transfer  Tool  Maternal Diabetes: Yes:  Diabetes Type:  Insulin/Medication controlled T1DM Genetic Screening: Normal Maternal Ultrasounds/Referrals: Normal Fetal Ultrasounds or other Referrals:  Fetal echo Maternal Substance Abuse:  No Significant Maternal Medications: Insulin, metformin, synthroid  Significant Maternal Lab Results: Group B Strep negative  Results for orders placed or performed during the hospital encounter of 05/22/21 (from the past 24 hour(s))  POCT fern test   Collection Time: 05/22/21 11:37 PM  Result Value Ref Range   POCT Fern Test Negative = intact amniotic membranes   Protein / creatinine ratio, urine   Collection Time: 05/22/21 11:55 PM  Result Value Ref Range   Creatinine, Urine 436.44 mg/dL   Total Protein, Urine 39 mg/dL   Protein Creatinine Ratio 0.09 0.00 - 0.15 mg/mg[Cre]  Comprehensive metabolic panel   Collection Time: 05/23/21 12:16 AM  Result Value Ref Range   Sodium 135 135 - 145 mmol/L   Potassium 4.1 3.5 - 5.1 mmol/L   Chloride 106 98 - 111 mmol/L   CO2 21 (L) 22 - 32 mmol/L   Glucose, Bld 166 (H) 70 - 99 mg/dL   BUN 7 6 - 20 mg/dL   Creatinine, Ser 0.25 (H) 0.44 - 1.00 mg/dL   Calcium 8.6 (L) 8.9 - 10.3 mg/dL   Total Protein 5.7 (L) 6.5 - 8.1 g/dL   Albumin 2.1 (L) 3.5 - 5.0 g/dL   AST 16 15 - 41 U/L   ALT 14 0 - 44 U/L   Alkaline Phosphatase 162 (H) 38 - 126 U/L   Total Bilirubin 0.3 0.3 - 1.2 mg/dL   GFR, Estimated >85 >27 mL/min   Anion gap 8 5  - 15  CBC   Collection Time: 05/23/21 12:16 AM  Result Value Ref Range   WBC 6.5 4.0 - 10.5 K/uL   RBC 3.62 (L) 3.87 - 5.11 MIL/uL   Hemoglobin 9.6 (L) 12.0 - 15.0 g/dL   HCT 78.2 (L) 42.3 - 53.6 %   MCV 85.6 80.0 - 100.0 fL   MCH 26.5 26.0 - 34.0 pg   MCHC 31.0 30.0 - 36.0 g/dL   RDW 14.4 31.5 - 40.0 %   Platelets 229 150 - 400 K/uL   nRBC 0.0 0.0 - 0.2 %    Patient Active Problem List   Diagnosis Date Noted   Dizziness 04/08/2021   Fetal head 1/5 above pelvic brim 04/08/2021   Mold exposure 03/25/2021   Hypothyroid 12/15/2020   Binge eating disorder 12/10/2020   Supervision of high-risk pregnancy 12/04/2020   Vitamin D deficiency 12/02/2020   Pre-existing type 1 diabetes mellitus during pregnancy, antepartum 11/12/2020   Abscess 07/08/2020   Irritable bowel syndrome with constipation 12/08/2018   DKA (diabetic ketoacidoses) 05/22/2018   Anxiety and depression 01/16/2018   Panic attack 01/16/2018   Acne vulgaris 10/29/2016   Type 1 diabetes (HCC) 06/04/2013   Asthma with acute exacerbation 09/21/2012    Assessment/Plan:  Lillia Pauls Tomkiewicz is a 20 y.o. G1P0000 at [redacted]w[redacted]d here for IOL due to gestational hypertension.   #Labor: Contracting about every 2 minutes since arrival on L&D and starting to feel them more, will continue to monitor, if spacing will give cytotec. Otherwise, will allow expectant management for the next 2 or so hours.  #Pain: PRN #FWB: Cat 1 #ID: GBS negative  #MOF: Breastfeeding  #MOC: POPs #Circ: NA female "[name]"   #Gestational Hypertension: SBP 130-140's since arrival. Cr 1.01 on admit (previous 0.5 in 04/2021), otherwise pre-e labs WNL. Although almost doubling  of creatinine, with known diabetes and change in eating/drinking habits over the last week, will give 500cc and recheck CMP around 0700. No concerning symptoms. Cont to monitor.    #T1DM: CBG 166 on admit, however has been reasonably controlled at home. Follows with endocrinology.  Currently on tresiba/novolog/metformin. Plan to do endotool while in labor and return to pre-pregnancy insulin regimen postpartum (tresiba ~55 U, metformin, Novolog 1:5 ratio with meals). EFW 66% at 36w.  #Anxiety/depression: Stable currently. Cont to monitor. Will need pp depression follow up.    #Hypothyroidism: On synthroid 25 mcg. Can consider dc vs continuation postpartum, not on synthroid prior to pregnancy.    Patriciaann Clan, DO  05/23/2021, 1:45 AM

## 2021-05-23 NOTE — Progress Notes (Signed)
Labor Progress Note Shawneequa Idali Lafever is a 20 y.o. G1P0000 at [redacted]w[redacted]d presented for IOL d/t gHTN.   S: Doing well. Feeling some contractions.   O:  BP 136/83   Pulse 99   Temp 98.2 F (36.8 C) (Oral)   Resp 18   Ht 5\' 2"  (1.575 m)   Wt 101.6 kg   LMP 08/30/2020   SpO2 98%   BMI 40.97 kg/m  EFM: 130/mod/15x15/none  CVE: Dilation: Closed Effacement (%): Thick Station: -3 Presentation: Vertex Exam by:: Dr.Jansel Vonstein   A&P: 20 y.o. G1P0000 [redacted]w[redacted]d  #Labor: Minimal progression with contractions spaced. Placed cytotec vaginally.  #Pain: PRN #FWB: Cat 1 #GBS negative  #T1DM: CBGs 130-140, just recently started on Endotool. Will cont to titrate.   #GHTN: BP WNL. CMP to be collected shortly, will f/u Cr.   [redacted]w[redacted]d, DO 6:37 AM

## 2021-05-23 NOTE — Progress Notes (Signed)
Labor Progress Note Linda [name] is a 20 y.o. G1P0000 at [redacted]w[redacted]d who presented for IOL due to gHTN.   S: Resting comfortably. No concerns at this time.  O:  BP (!) 108/57   Pulse 92   Temp 98.6 F (37 C) (Oral)   Resp 18   Ht 5\' 2"  (1.575 m)   Wt 101.6 kg   LMP 08/30/2020   SpO2 98%   BMI 40.97 kg/m   EFM: Baseline 135 bpm, moderate variability, + accels, no decels  Toco: Intermittent contractions  CVE: Dilation: Closed Effacement (%): Thick Station: -3 Presentation: Vertex Exam by:: Dariana Garbett MD   A&P: 20 y.o. G1P0000 [redacted]w[redacted]d   #Labor: Cervix remains closed. Will give additional dose of Cytotec via buccal route and reassess in 4 hours.  #Pain: PRN #FWB: Cat 1  #GBS negative  #gHTN: BP remains in normal range since last check. No symptoms. Will continue to monitor.   #T1DM: On Endotool. CBGs now at goal and less than 120. Will plan to use Dexcom CGM on patient's arm for ongoing glucose checks, as her readings are compatible with glucometer and will allow for less fingersticks. Will continue to monitor closely.   [redacted]w[redacted]d, MD 3:25 PM

## 2021-05-23 NOTE — MAU Note (Signed)
Verified vertex presentation via bedside ultrasound with Jessica Emly, CNM  ?

## 2021-05-23 NOTE — Telephone Encounter (Signed)
Pt was seen in office and able to address.  Seems as though she had not been compliant with taking medication fasting.  Will continue to monitor

## 2021-05-23 NOTE — Progress Notes (Signed)
Patient ID: Linda [name], female   DOB: 2001/05/16, 20 y.o.   MRN: [id number] Attempted balloon placement  Vitals:   05/23/21 1925 05/23/21 2020 05/23/21 2100 05/23/21 2215  BP: 116/76 129/80 127/88 126/85  Pulse: 92 93 92 100  Resp: 17 17 17    Temp: 98.4 F (36.9 C)     TempSrc: Oral     SpO2:      Weight:      Height:       FHR stable UCs irregular  Dilation: 1 Effacement (%): 70 Station: -3 Presentation: Vertex Exam by:: [email or web address], CNM  Unable to get balloon to pass, so stopped attempt Will do one more cytotec and reevaluate in 4 hours

## 2021-05-23 NOTE — Progress Notes (Signed)
Labor Progress Note Linda [name] is a 20 y.o. G1P0000 at [redacted]w[redacted]d who presented for IOL due to gHTN.   S: Doing well. Mildly contracting with Cytotec. No concerns at this time.   O:  BP 120/66   Pulse 93   Temp 98 F (36.7 C) (Oral)   Resp 18   Ht 5\' 2"  (1.575 m)   Wt 101.6 kg   LMP 08/30/2020   SpO2 98%   BMI 40.97 kg/m   EFM: Baseline 135 bpm, moderate variability, + accels, no decels   CVE: Dilation: Closed Effacement (%): Thick Station: -3 Presentation: Vertex Exam by:: A Showfety RN   A&P: 20 y.o. G1P0000 [redacted]w[redacted]d   #Labor: SVE unchanged. Repeat dose of Cytotec given vaginally. Will reassess in 4 hours. Attempt foley balloon placement when able.  #Pain: PRN #FWB: Cat 1  #GBS negative  #gHTN: BP normal to mild range. No symptoms. Pre-E labs on admission reassuring aside from mild rise in creatinine to 1.01 from 0.54 previously. Repeat creatinine this AM improved to 0.78. Will continue to monitor.   #T1DM: On Endotool. CBGs trending down to goal, last 119. Range 119-147 since admission. Will continue to monitor per protocol.   [redacted]w[redacted]d, MD 11:20 AM

## 2021-05-24 ENCOUNTER — Inpatient Hospital Stay (HOSPITAL_COMMUNITY): Payer: Medicaid Other | Admitting: Anesthesiology

## 2021-05-24 DIAGNOSIS — O9902 Anemia complicating childbirth: Secondary | ICD-10-CM | POA: Diagnosis not present

## 2021-05-24 DIAGNOSIS — D649 Anemia, unspecified: Secondary | ICD-10-CM | POA: Diagnosis not present

## 2021-05-24 DIAGNOSIS — O134 Gestational [pregnancy-induced] hypertension without significant proteinuria, complicating childbirth: Secondary | ICD-10-CM | POA: Diagnosis not present

## 2021-05-24 DIAGNOSIS — Z3A38 38 weeks gestation of pregnancy: Secondary | ICD-10-CM | POA: Diagnosis not present

## 2021-05-24 LAB — CBC
HCT: 30.3 % — ABNORMAL LOW (ref 36.0–46.0)
Hemoglobin: 9.5 g/dL — ABNORMAL LOW (ref 12.0–15.0)
MCH: 26.6 pg (ref 26.0–34.0)
MCHC: 31.4 g/dL (ref 30.0–36.0)
MCV: 84.9 fL (ref 80.0–100.0)
Platelets: 190 10*3/uL (ref 150–400)
RBC: 3.57 MIL/uL — ABNORMAL LOW (ref 3.87–5.11)
RDW: 13.2 % (ref 11.5–15.5)
WBC: 8.1 10*3/uL (ref 4.0–10.5)
nRBC: 0 % (ref 0.0–0.2)

## 2021-05-24 MED ORDER — PHENYLEPHRINE 40 MCG/ML (10ML) SYRINGE FOR IV PUSH (FOR BLOOD PRESSURE SUPPORT): 80.0000 ug | PREFILLED_SYRINGE | INTRAVENOUS | Status: DC | PRN

## 2021-05-24 MED ORDER — TERBUTALINE SULFATE 1 MG/ML IJ SOLN: 0.2500 mg | Freq: Once | INTRAMUSCULAR | Status: DC | PRN

## 2021-05-24 MED ORDER — EPHEDRINE 5 MG/ML INJ: 10.0000 mg | INTRAVENOUS | Status: DC | PRN

## 2021-05-24 MED ORDER — DIPHENHYDRAMINE HCL 50 MG/ML IJ SOLN: 12.5000 mg | INTRAMUSCULAR | Status: DC | PRN

## 2021-05-24 MED ORDER — LIDOCAINE-EPINEPHRINE (PF) 2 %-1:200000 IJ SOLN
INTRAMUSCULAR | Status: DC | PRN
  Administered 2021-05-24: 5 mL via EPIDURAL

## 2021-05-24 MED ORDER — FENTANYL-BUPIVACAINE-NACL 0.5-0.125-0.9 MG/250ML-% EP SOLN
12.0000 mL/h | EPIDURAL | Status: DC | PRN
  Administered 2021-05-24 – 2021-05-25 (×3): 12 mL/h via EPIDURAL
  Filled 2021-05-24 (×3): qty 250

## 2021-05-24 MED ORDER — LEVOTHYROXINE SODIUM 25 MCG PO TABS
25.0000 ug | ORAL_TABLET | Freq: Every day | ORAL | Status: DC
  Administered 2021-05-24: 25 ug via ORAL
  Filled 2021-05-24 (×2): qty 1

## 2021-05-24 MED ORDER — OXYTOCIN-SODIUM CHLORIDE 30-0.9 UT/500ML-% IV SOLN
1.0000 m[IU]/min | INTRAVENOUS | Status: DC
  Administered 2021-05-24: 2 m[IU]/min via INTRAVENOUS
  Filled 2021-05-24: qty 500

## 2021-05-24 MED ORDER — LACTATED RINGERS IV SOLN
500.0000 mL | Freq: Once | INTRAVENOUS | Status: AC
  Administered 2021-05-24: 500 mL via INTRAVENOUS

## 2021-05-24 NOTE — Progress Notes (Signed)
Jozy Wende Longstreth is a 20 y.o. G1P0000 at [redacted]w[redacted]d admitted for induction of labor due to gHTN and hx of T1DM.  Subjective: Feels comfortable with epidural  Objective: BP 115/65   Pulse 92   Temp 98.6 F (37 C) (Axillary)   Resp 17   Ht 5\' 2"  (1.575 m)   Wt 101.6 kg   LMP 08/30/2020   SpO2 98%   BMI 40.97 kg/m  No intake/output data recorded. Total I/O In: 0  Out: 450 [Urine:450]  FHT:  FHR: 145 bpm, variability: moderate,  accelerations:  Present,  decelerations:  Absent UC:   every 3-4 min SVE:   Dilation: 4 Effacement (%): 80 Station: -2 Exam by:: Dr. [email or web address]  Labs: Lab Results  Component Value Date   WBC 8.1 05/24/2021   HGB 9.5 (L) 05/24/2021   HCT 30.3 (L) 05/24/2021   MCV 84.9 05/24/2021   PLT 190 05/24/2021    Assessment / Plan: G1P0000 at [redacted]w[redacted]d admitted for induction of labor due to gHTN and hx of T1DM.  Labor: s/p cytotec x3, SROM at 0200, foley bulb now out. Pit started at 1015. Contractions not tracing on external monitor but able to palpate. Placed IUPC for better contraction tracing and to assist with Pitocin titration. gHTN:   now normotensive.  Cr intially elevated, then downtrended to normal. Continue to monitor BP and for signs of preE  Fetal Wellbeing:  Category I Pain Control:  Epidural I/D:   GBS neg   #T1DM On endotool. Continue to monitor [redacted]w[redacted]d 05/24/2021, 1:03 PM

## 2021-05-24 NOTE — Progress Notes (Cosign Needed Addendum)
Labor Progress Note Linda [name] is a 20 y.o. G1P0000 at [redacted]w[redacted]d presented for IOL gHTN, hx type 1 DM  S: Patient comfortable with epidural  O:  BP (!) 99/56   Pulse 84   Temp 98.8 F (37.1 C) (Axillary)   Resp 18   Ht 5\' 2"  (1.575 m)   Wt 101.6 kg   LMP 08/30/2020   SpO2 98%   BMI 40.97 kg/m    FHT Baseline HR: 130 Variability: minimal Accels: present Decels: none   CVE: Dilation: 8 Effacement (%): 70 (on left side of cervix) Station: -1 Presentation: Vertex Exam by:: [email or web address], RN   A&P: 20 y.o. G1P0000 [redacted]w[redacted]d IOL gHTN #Labor: Progressed since last check. Cervix very soft L>R. Contractions more frequent (every 2-3 minutes) at adequate strength per IUPC.Continuing with Pit at current dose.  #Pain: Epidural #FWB: Category 2 #GBS negative  #T1DM: One CBG of 65 per Dexcom. Given juice with improvement of glucose. No symptoms of hypoglycemia, no nausea  [redacted]w[redacted]d, DO 11:01 PM

## 2021-05-24 NOTE — Progress Notes (Addendum)
Labor Progress Note Linda [name] is a 20 y.o. G1P0000 at [redacted]w[redacted]d presented for IOL due to gHTN and hx T1DM  S: Feeling pressure, but comfortable with epidural. Laying in left lateral recumbant which makes her more comfortable  O:  BP 107/62   Pulse 90   Temp 98.3 F (36.8 C) (Oral)   Resp 18   Ht 5\' 2"  (1.575 m)   Wt 101.6 kg   LMP 08/30/2020   SpO2 98%   BMI 40.97 kg/m   FHT Baseline HR: 120 Variability: Moderate Accelerations: present Decelerations: none  CVE: Dilation: 6.5 Effacement (%): 70 Station: -2 Presentation: Vertex Exam by:: Dr. [email or web address]   A&P: 20 y.o. G1P0000 [redacted]w[redacted]d admitted for IOL d/t gHTN and hx T1DM #Labor: S/p Cytotec x 3. SROM at 0200. FB out at 1145. On Pit (26 milli-units), no plan for pit break at this time. IUPC in place- showing inadequate contractions. Plan for cervical check in next hour #Pain: Epidural #FWB: Cat 1 #GBS negative #T1DM: On endotool. Using Dexcom CGM for glucose checks. Monitoring closely #gHTN: Normotensive. Monitoring for BP and signs of preE  [redacted]w[redacted]d, DO 10:06 PM

## 2021-05-24 NOTE — Progress Notes (Signed)
Labor Progress Note Linda [name] is a 20 y.o. G1P0000 at [redacted]w[redacted]d presented for IOL for gHTN  S:  Patient comfortable with epidural  O:  BP 94/60 (BP Location: Right Arm)   Pulse 92   Temp 98.8 F (37.1 C) (Oral)   Resp 17   Ht 5\' 2"  (1.575 m)   Wt 101.6 kg   LMP 08/30/2020   SpO2 98%   BMI 40.97 kg/m   Fetal Tracing:  Baseline: 130 Variability: moderate Accels: 15x15 Decels: none  Toco: none   CVE: Dilation: 1.5 Effacement (%): 80 Station: -2 Presentation: Vertex Exam by:: [email or web address], CNM   A&P: 20 y.o. G1P0000 [redacted]w[redacted]d IOL gHTN #Labor: Cervic unchanged. Last cytotec 2200. Discussed with patient placement of foley balloon for cervical ripening. Risks and benefits reviewed. Patient agreeable to plan of care. Foley balloon inserted without difficulty and inflated with 60cc sterile water. Patient tolerated procedure well. Will start pitocin 2x2 since cervix is thin.   #Pain:  epidural #FWB: Cat 1 #GBS negative  [redacted]w[redacted]d, CNM 10:42 AM

## 2021-05-24 NOTE — Anesthesia Procedure Notes (Signed)
Epidural Patient location during procedure: OB Start time: 05/24/2021 3:10 AM End time: 05/24/2021 3:30 AM  Staffing Anesthesiologist: Elmer Picker, MD Performed: anesthesiologist   Preanesthetic Checklist Completed: patient identified, IV checked, risks and benefits discussed, monitors and equipment checked, pre-op evaluation and timeout performed  Epidural Patient position: sitting Prep: DuraPrep and site prepped and draped Patient monitoring: continuous pulse ox, blood pressure, heart rate and cardiac monitor Approach: midline Location: L3-L4 Injection technique: LOR air  Needle:  Needle type: Tuohy  Needle gauge: 17 G Needle length: 9 cm Needle insertion depth: 7 cm Catheter type: closed end flexible Catheter size: 19 Gauge Catheter at skin depth: 12 cm Test dose: negative  Assessment Sensory level: T8 Events: blood not aspirated, injection not painful, no injection resistance, no paresthesia and negative IV test  Additional Notes Patient identified. Risks/Benefits/Options discussed with patient including but not limited to bleeding, infection, nerve damage, paralysis, failed block, incomplete pain control, headache, blood pressure changes, nausea, vomiting, reactions to medication both or allergic, itching and postpartum back pain. Confirmed with bedside nurse the patient's most recent platelet count. Confirmed with patient that they are not currently taking any anticoagulation, have any bleeding history or any family history of bleeding disorders. Patient expressed understanding and wished to proceed. All questions were answered. Sterile technique was used throughout the entire procedure. Please see nursing notes for vital signs. Test dose was given through epidural catheter and negative prior to continuing to dose epidural or start infusion. Warning signs of high block given to the patient including shortness of breath, tingling/numbness in hands, complete motor block,  or any concerning symptoms with instructions to call for help. Patient was given instructions on fall risk and not to get out of bed. All questions and concerns addressed with instructions to call with any issues or inadequate analgesia.    1st attempt with aspiration of blood. Catheter removed with tip intact. 2nd attempt with negative aspiration. Reason for block:procedure for pain

## 2021-05-24 NOTE — Anesthesia Preprocedure Evaluation (Addendum)
Anesthesia Evaluation  Patient identified by MRN, date of birth, ID band Patient awake    Reviewed: Allergy & Precautions, NPO status , Patient's Chart, lab work & pertinent test results  Airway Mallampati: III  TM Distance: >3 FB Neck ROM: Full    Dental no notable dental hx. (+) Teeth Intact, Dental Advisory Given   Pulmonary asthma ,    Pulmonary exam normal breath sounds clear to auscultation       Cardiovascular hypertension (gHTN), Normal cardiovascular exam Rhythm:Regular Rate:Normal     Neuro/Psych PSYCHIATRIC DISORDERS Anxiety Depression negative neurological ROS     GI/Hepatic negative GI ROS, Neg liver ROS,   Endo/Other  diabetes, Type 1, Oral Hypoglycemic Agents, Insulin DependentHypothyroidism Morbid obesity (BMI 41)  Renal/GU negative Renal ROS  negative genitourinary   Musculoskeletal negative musculoskeletal ROS (+)   Abdominal   Peds  Hematology  (+) Blood dyscrasia (Hgb 9.6), anemia ,   Anesthesia Other Findings   Reproductive/Obstetrics (+) Pregnancy                             Anesthesia Physical Anesthesia Plan  ASA: 3  Anesthesia Plan: Epidural   Post-op Pain Management:    Induction:   PONV Risk Score and Plan: Treatment may vary due to age or medical condition  Airway Management Planned: Natural Airway  Additional Equipment:   Intra-op Plan:   Post-operative Plan:   Informed Consent: I have reviewed the patients History and Physical, chart, labs and discussed the procedure including the risks, benefits and alternatives for the proposed anesthesia with the patient or authorized representative who has indicated his/her understanding and acceptance.       Plan Discussed with: Anesthesiologist  Anesthesia Plan Comments: (Patient identified. Risks, benefits, options discussed with patient including but not limited to bleeding, infection, nerve damage,  paralysis, failed block, incomplete pain control, headache, blood pressure changes, nausea, vomiting, reactions to medication, itching, and post partum back pain. Confirmed with bedside nurse the patient's most recent platelet count. Confirmed with the patient that they are not taking any anticoagulation, have any bleeding history or any family history of bleeding disorders. Patient expressed understanding and wishes to proceed. All questions were answered. )        Anesthesia Quick Evaluation

## 2021-05-24 NOTE — Progress Notes (Signed)
Labor Progress Note Linda [name] is a 20 y.o. G1P0000 at [redacted]w[redacted]d presented for IOL d/t GHTN    S: Doing well, just had epidural placed after contractions were becoming much stronger and closer together. RN reports she started leaking clear fluid about 2 hours ago.   O:  BP 123/79   Pulse (!) 101   Temp 98.6 F (37 C)   Resp 18   Ht 5\' 2"  (1.575 m)   Wt 101.6 kg   LMP 08/30/2020   SpO2 100%   BMI 40.97 kg/m  EFM: 145/mod/none/none  CVE: Dilation: 1.5 Effacement (%): 80 Station: -3 Presentation: Vertex Exam by:: Dr. [email or web address]   A&P: 20 y.o. G1P0000 [redacted]w[redacted]d  #Labor: Some progression since last check with more frequent and painful contractions, now recently s/p epidural placement. Cervix very soft, unlikely to benefit from FB at this point. Will allow expectant management with recent SROM. Will monitor closely, consider addition of pit if spacing.  #Pain: Epidural now in place  #FWB: Cat 1  #GBS negative  #T1DM: CBGs now at goal with Endotool. Cont to monitor.   #Gestational HTN: BP WNL recently. Pre-e labs wnl on admit (Cr improved after IVF bolus). No symptoms. Cont to monitor.   [redacted]w[redacted]d, DO 4:14 AM

## 2021-05-25 ENCOUNTER — Encounter (HOSPITAL_COMMUNITY): Payer: Self-pay | Admitting: Obstetrics and Gynecology

## 2021-05-25 DIAGNOSIS — O134 Gestational [pregnancy-induced] hypertension without significant proteinuria, complicating childbirth: Secondary | ICD-10-CM | POA: Diagnosis not present

## 2021-05-25 DIAGNOSIS — Z3A38 38 weeks gestation of pregnancy: Secondary | ICD-10-CM | POA: Diagnosis not present

## 2021-05-25 DIAGNOSIS — O2402 Pre-existing diabetes mellitus, type 1, in childbirth: Secondary | ICD-10-CM | POA: Diagnosis not present

## 2021-05-25 LAB — GLUCOSE, CAPILLARY
Glucose-Capillary: 108 mg/dL — ABNORMAL HIGH (ref 70–99)
Glucose-Capillary: 102 mg/dL — ABNORMAL HIGH (ref 70–99)
Glucose-Capillary: 78 mg/dL (ref 70–99)
Glucose-Capillary: 84 mg/dL (ref 70–99)
Glucose-Capillary: 92 mg/dL (ref 70–99)
Glucose-Capillary: 79 mg/dL (ref 70–99)

## 2021-05-25 MED ORDER — INSULIN ASPART 100 UNIT/ML IJ SOLN
0.0000 [IU] | Freq: Three times a day (TID) | INTRAMUSCULAR | Status: DC
  Administered 2021-05-25: 1 [IU] via SUBCUTANEOUS
  Administered 2021-05-26: 3 [IU] via SUBCUTANEOUS
  Administered 2021-05-26: 5 [IU] via SUBCUTANEOUS
  Administered 2021-05-26: 3 [IU] via SUBCUTANEOUS

## 2021-05-25 MED ORDER — INSULIN GLARGINE-YFGN 100 UNIT/ML ~~LOC~~ SOLN
20.0000 [IU] | Freq: Every day | SUBCUTANEOUS | Status: DC
  Administered 2021-05-25: 20 [IU] via SUBCUTANEOUS
  Filled 2021-05-25 (×2): qty 0.2

## 2021-05-25 MED ORDER — FUROSEMIDE 20 MG PO TABS
20.0000 mg | ORAL_TABLET | Freq: Two times a day (BID) | ORAL | Status: DC
  Administered 2021-05-25 – 2021-05-27 (×4): 20 mg via ORAL
  Filled 2021-05-25 (×5): qty 1

## 2021-05-25 MED ORDER — DIPHENHYDRAMINE HCL 25 MG PO CAPS: 25.0000 mg | ORAL_CAPSULE | Freq: Four times a day (QID) | ORAL | Status: DC | PRN

## 2021-05-25 MED ORDER — SENNOSIDES-DOCUSATE SODIUM 8.6-50 MG PO TABS
2.0000 | ORAL_TABLET | Freq: Every day | ORAL | Status: DC
  Administered 2021-05-26 – 2021-05-27 (×2): 2 via ORAL
  Filled 2021-05-25 (×2): qty 2

## 2021-05-25 MED ORDER — MISOPROSTOL 200 MCG PO TABS
ORAL_TABLET | ORAL | Status: AC
  Administered 2021-05-25: 1000 ug via RECTAL
  Filled 2021-05-25: qty 5

## 2021-05-25 MED ORDER — WITCH HAZEL-GLYCERIN EX PADS
1.0000 "application " | MEDICATED_PAD | CUTANEOUS | Status: DC | PRN
  Administered 2021-05-27: 1 via TOPICAL

## 2021-05-25 MED ORDER — FUROSEMIDE 20 MG PO TABS
20.0000 mg | ORAL_TABLET | Freq: Two times a day (BID) | ORAL | Status: DC
Start: 2021-05-25 — End: 2021-05-25

## 2021-05-25 MED ORDER — SIMETHICONE 80 MG PO CHEW: 80.0000 mg | CHEWABLE_TABLET | ORAL | Status: DC | PRN

## 2021-05-25 MED ORDER — ONDANSETRON HCL 4 MG/2ML IJ SOLN: 4.0000 mg | INTRAMUSCULAR | Status: DC | PRN

## 2021-05-25 MED ORDER — IBUPROFEN 600 MG PO TABS
600.0000 mg | ORAL_TABLET | Freq: Four times a day (QID) | ORAL | Status: DC
  Administered 2021-05-25 – 2021-05-27 (×8): 600 mg via ORAL
  Filled 2021-05-25 (×8): qty 1

## 2021-05-25 MED ORDER — ONDANSETRON HCL 4 MG PO TABS: 4.0000 mg | ORAL_TABLET | ORAL | Status: DC | PRN

## 2021-05-25 MED ORDER — LEVOTHYROXINE SODIUM 50 MCG PO TABS
25.0000 ug | ORAL_TABLET | Freq: Every day | ORAL | Status: DC
  Administered 2021-05-25 – 2021-05-27 (×3): 25 ug via ORAL
  Filled 2021-05-25 (×2): qty 1

## 2021-05-25 MED ORDER — DIBUCAINE (PERIANAL) 1 % EX OINT: 1.0000 "application " | TOPICAL_OINTMENT | CUTANEOUS | Status: DC | PRN

## 2021-05-25 MED ORDER — TETANUS-DIPHTH-ACELL PERTUSSIS 5-2.5-18.5 LF-MCG/0.5 IM SUSY: 0.5000 mL | PREFILLED_SYRINGE | Freq: Once | INTRAMUSCULAR | Status: DC

## 2021-05-25 MED ORDER — MEASLES, MUMPS & RUBELLA VAC IJ SOLR: 0.5000 mL | Freq: Once | INTRAMUSCULAR | Status: DC

## 2021-05-25 MED ORDER — BENZOCAINE-MENTHOL 20-0.5 % EX AERO
1.0000 "application " | INHALATION_SPRAY | CUTANEOUS | Status: DC | PRN
  Administered 2021-05-25: 1 via TOPICAL
  Filled 2021-05-25: qty 56

## 2021-05-25 MED ORDER — TRANEXAMIC ACID-NACL 1000-0.7 MG/100ML-% IV SOLN
1000.0000 mg | Freq: Once | INTRAVENOUS | Status: AC
  Administered 2021-05-25: 1000 mg via INTRAVENOUS
  Filled 2021-05-25: qty 100

## 2021-05-25 MED ORDER — ACETAMINOPHEN 325 MG PO TABS
650.0000 mg | ORAL_TABLET | ORAL | Status: DC | PRN
  Administered 2021-05-26: 650 mg via ORAL
  Filled 2021-05-25: qty 2

## 2021-05-25 MED ORDER — COCONUT OIL OIL: 1.0000 "application " | TOPICAL_OIL | Status: DC | PRN

## 2021-05-25 MED ORDER — PRENATAL MULTIVITAMIN CH
1.0000 | ORAL_TABLET | Freq: Every day | ORAL | Status: DC
  Administered 2021-05-26 – 2021-05-27 (×2): 1 via ORAL
  Filled 2021-05-25 (×2): qty 1

## 2021-05-25 NOTE — Discharge Summary (Addendum)
Postpartum Discharge Summary    Patient Name: Casey Maxfield DOB: 2000-07-23 MRN: [id number]  Date of admission: 05/22/2021 Delivery date:05/25/2021  Delivering provider: Genia Del  Date of discharge: 05/27/2021  Admitting diagnosis: Gestational hypertension [O13.9] Intrauterine pregnancy: [redacted]w[redacted]d    Secondary diagnosis:  Principal Problem:   SVD (spontaneous vaginal delivery) Active Problems:   Type 1 diabetes ([place]   Anxiety and depression   Supervision of high-risk pregnancy   Hypothyroid   Gestational hypertension  Additional problems: None    Discharge diagnosis: Term Pregnancy Delivered                                              Post partum procedures: None Augmentation: AROM, Pitocin, Cytotec, and IP Foley Complications: None  Hospital course: Induction of Labor With Vaginal Delivery   20y.o. yo G1P1001 at 3103w2das admitted to the hospital 05/22/2021 for induction of labor.  Indication for induction: Gestational hypertension.  Patient started her induction with Cytotec followed by foley balloon placement. She was then started on Pitocin and AROM was performed. She had slow progression and had an IUPC placed for improved contraction monitoring. She slowly progressed to complete and had an uncomplicated vaginal delivery. Membrane Rupture Time/Date: 4:08 AM ,05/24/2021   Delivery Method:Vaginal, Spontaneous  Episiotomy: None  Lacerations:  1st degree;Perineal;Periurethral  Details of delivery can be found in separate delivery note.  Patient had a routine postpartum course. For her blood glucose management she was given Semglee and mealtime insulin and SSI. At discharge upon discussion with diabetes educator her home insulin regimen was changed to [place]Units nightly and CHO 1U/10g CHO on 11/25. Patient has follow up appointment with endocrinology. Patient is discharged home 05/27/21.  Newborn Data: Birth date:05/25/2021  Birth time:2:58 PM   Gender:Female  Living status:Living  Apgars:8 ,9  Weight:3230 g   Magnesium Sulfate received: No BMZ received: No Rhophylac: N/A MMR: N/A T-DaP: Given prenatally Flu: Given prenatally Transfusion: No   Physical exam  Vitals:   05/26/21 0553 05/26/21 1437 05/26/21 2214 05/27/21 0519  BP:  124/86 132/81 123/85  Pulse: 97 96 87 75  Resp: '17 16 18 16  ' Temp: 98.6 F (37 C) 98.1 F (36.7 C) 97.8 F (36.6 C) 97.8 F (36.6 C)  TempSrc: Oral Oral Oral Oral  SpO2: 97% 96% 97% 97%  Weight:      Height:       General: alert Lochia: appropriate Uterine Fundus: firm Incision: N/A DVT Evaluation: No evidence of DVT seen on physical exam.  Labs: Lab Results  Component Value Date   WBC 8.1 05/24/2021   HGB 9.5 (L) 05/24/2021   HCT 30.3 (L) 05/24/2021   MCV 84.9 05/24/2021   PLT 190 05/24/2021   CMP Latest Ref Rng & Units 05/23/2021  Glucose 70 - 99 mg/dL 141(H)  BUN 6 - 20 mg/dL 5(L)  Creatinine 0.44 - 1.00 mg/dL 0.78  Sodium 135 - 145 mmol/L 134(L)  Potassium 3.5 - 5.1 mmol/L 3.3(L)  Chloride 98 - 111 mmol/L 108  CO2 22 - 32 mmol/L 18(L)  Calcium 8.9 - 10.3 mg/dL 8.3(L)  Total Protein 6.5 - 8.1 g/dL 4.8(L)  Total Bilirubin 0.3 - 1.2 mg/dL 0.3  Alkaline Phos 38 - 126 U/L 138(H)  AST 15 - 41 U/L 15  ALT 0 - 44 U/L 12   Edinburgh  Score: Edinburgh Postnatal Depression Scale Screening Tool 05/26/2021  I have been able to laugh and see the funny side of things. 0  I have looked forward with enjoyment to things. 0  I have blamed myself unnecessarily when things went wrong. 1  I have been anxious or worried for no good reason. 2  I have felt scared or panicky for no good reason. 2  Things have been getting on top of me. 1  I have been so unhappy that I have had difficulty sleeping. 0  I have felt sad or miserable. 1  I have been so unhappy that I have been crying. 0  The thought of harming myself has occurred to me. 0  Edinburgh Postnatal Depression Scale Total 7      After visit meds:  Allergies as of 05/27/2021   No Known Allergies      Medication List     STOP taking these medications    aspirin 81 MG EC tablet   loratadine 10 MG tablet Commonly known as: CLARITIN   omeprazole 20 MG capsule Commonly known as: PRILOSEC   promethazine 25 MG tablet Commonly known as: PHENERGAN   sodium chloride 0.65 % Soln nasal spray Commonly known as: OCEAN   VITAMIN D3 PO       TAKE these medications    acetaminophen 325 MG tablet Commonly known as: Tylenol Take 2 tablets (650 mg total) by mouth every 4 (four) hours as needed (for pain scale < 4).   BD Pen Needle Nano 2nd Gen 32G X 4 MM Misc Generic drug: Insulin Pen Needle SMARTSIG:Injection   Dexcom G6 Sensor Misc SMARTSIG:Topical Every 10 Days   furosemide 20 MG tablet Commonly known as: LASIX Take 1 tablet (20 mg total) by mouth 2 (two) times daily for 3 doses.   glucagon 1 MG injection Inject 1 mg into the muscle once as needed (for blood sugar).   ibuprofen 600 MG tablet Commonly known as: ADVIL Take 1 tablet (600 mg total) by mouth every 6 (six) hours.   levothyroxine 25 MCG tablet Commonly known as: Synthroid Take 1 tablet (25 mcg total) by mouth daily before breakfast.   MELATONIN PO Take by mouth as needed.   metFORMIN 500 MG tablet Commonly known as: GLUCOPHAGE Take 500 mg by mouth 2 (two) times daily with a meal.   NIFEdipine 30 MG 24 hr tablet Commonly known as: ADALAT CC Take 1 tablet (30 mg total) by mouth daily.   norethindrone 0.35 MG tablet Commonly known as: Ortho Micronor Take 1 tablet (0.35 mg total) by mouth daily.   NovoLOG FlexPen 100 UNIT/ML FlexPen Generic drug: insulin aspart Inject 1 unit for every 10g of carbs with every snack or meal. What changed: See the new instructions.   PRENATAL VITAMIN PO Take 1 tablet by mouth daily.   Tyler Aas FlexTouch 100 UNIT/ML FlexTouch Pen Generic drug: insulin degludec Inject 20 Units into the  skin daily. What changed:  how much to take how to take this          Discharge home in stable condition Infant Feeding: Breast Infant Disposition:home with mother Discharge instruction: per After Visit Summary and Postpartum booklet. Activity: Advance as tolerated. Pelvic rest for 6 weeks.  Diet: routine diet Future Appointments: Future Appointments  Date Time Provider [place]  06/03/2021 10:50 AM [facility] NURSE [facility]  07/09/2021 11:30 AM Roma Schanz, CNM [facility]   Follow up Visit: Message sent to [facility] by Dr.  Albert on 05/25/21.   Please schedule this patient for a In person postpartum visit in  4-6 weeks  with the following provider: Any provider. Additional Postpartum F/U: Postpartum Depression checkup and BP check 1 week. Already has endocrinology follow up on 11/25 High risk pregnancy complicated by:  gHTN and T1DM Delivery mode:  Vaginal, Spontaneous  Anticipated Birth Control:  POPs   Renard Matter, MD, MPH OB Fellow, Faculty Practice

## 2021-05-25 NOTE — Progress Notes (Signed)
Patient ID: Linda [name], female   DOB: 2000/10/21, 19 y.o.   MRN: [id number]  Feeling some pressure, but not a huge amount; sitting up in throne position  BPs 117/69, 123/82 FHR 130s, +accels, occ mi variables Ctx q 2-5 mins with Pit at 58mu/min Cx 9/90/vtx 0  Last CBGs: 102, 108  IUP@38 .2wks gHTN T1DM Protracted active stage, but making change  Increase Pit to 20mu/min; recheck cx in next 2-3hrs  [name] [facility] 2 05/25/2021 5:24 AM

## 2021-05-25 NOTE — Progress Notes (Signed)
Inpatient Diabetes Program Recommendations  AACE/ADA: New Consensus Statement on Inpatient Glycemic Control (2015)  Target Ranges:  Prepandial:   less than 140 mg/dL      Peak postprandial:   less than 180 mg/dL (1-2 hours)      Critically ill patients:  140 - 180 mg/dL   Lab Results  Component Value Date   GLUCAP 79 05/25/2021   HGBA1C 7.4 (H) 12/10/2020    Review of Glycemic Control  Diabetes history: DM 1 Outpatient Diabetes medications:  Tresiba 60 units daily, Metformin 500 mg bid, Novolog 1 unit:5 grams of CHO (prior to pregnancy)-  Current orders for Inpatient glycemic control:  IV insulin (goal 90-120)  Inpatient Diabetes Program Recommendations:    Once baby is delivered, consider Semglee 20 units daily (2 hours prior to d/c of insulin drip), Novolog sensitive q 4 hours, and Novolog 1 units/10 grams CHO with meals.  Will follow.   Thanks,  Beryl Meager, RN, BC-ADM Inpatient Diabetes Coordinator Pager [phone]  (8a-5p)

## 2021-05-25 NOTE — Progress Notes (Signed)
Labor Progress Note Linda [name] is a 20 y.o. G1P0000 at [redacted]w[redacted]d who presented for IOL due to gHTN.  S: Doing well. Feeling more pressure than before. No other concerns at this time.  O:  BP 106/70   Pulse (!) 130   Temp 99 F (37.2 C) (Oral)   Resp 16   Ht 5\' 2"  (1.575 m)   Wt 101.6 kg   LMP 08/30/2020   SpO2 99%   BMI 40.97 kg/m   EFM: Baseline 155 bpm, min-mod variability, no accels, no decels  CVE: Dilation: Lip/rim Effacement (%): 100 Station: 0 Presentation: Vertex Exam by:: lee   A&P: 20 y.o. G1P0000 [redacted]w[redacted]d   #Labor: Progressing well. Nearly complete. Will plan to labor down for the next hour or so and reassess.  #Pain: Epidural  #FWB: Cat 1  #GBS negative  #gHTN: Normal BPs at this time. No symptoms. Will continue to monitor.   #T1DM: On Endotool. CBGs stable. Will continue to monitor.   [redacted]w[redacted]d, MD 10:13 AM

## 2021-05-25 NOTE — Lactation Note (Signed)
This note was copied from a baby's chart. Lactation Consultation Note  Patient Name: Linda [name] YWVPX'T Date: 05/25/2021   Age:21 hours P1, mom was eating dinner and Mom would like LC to return later for [facility] consult.  Maternal Data    Feeding Nipple Type: Slow - flow  LATCH Score                    Lactation Tools Discussed/Used    Interventions    Discharge    Consult Status      Danelle Earthly 05/25/2021, 7:25 PM

## 2021-05-25 NOTE — Progress Notes (Signed)
Patient now complete but remains at 0 station. Will labor down and reassess in 1 hour. Cat 1 tracing.   Evalina Field, MD

## 2021-05-25 NOTE — Progress Notes (Addendum)
Labor Progress Note Linda [name] is a 20 y.o. G1P0000 at [redacted]w[redacted]d presented for IOL due to gHTN, hx type 1 DM.  S: Feeling more intense pressure, contractions.  O:  BP 106/70   Pulse 86   Temp 98.4 F (36.9 C) (Axillary)   Resp 16   Ht 5\' 2"  (1.575 m)   Wt 101.6 kg   LMP 08/30/2020   SpO2 98%   BMI 40.97 kg/m   FHT Baseline HR: 130 Variability: moderate Accelerations: present Decelerations: none  CVE: Dilation: 8 Effacement (%): 70 (on left side of cervix) Station: -1 Presentation: Vertex Exam by:: [email or web address], RN   A&P: 20 y.o. G1P0000 [redacted]w[redacted]d  #Labor: Unchanged from last cervical check. Continuing with pitocin at current dose. More frequent and painful contractions. IUPC tracing showing contractions every 2-3 minutes. Will monitor closely #Pain: Epidural #FWB: Cat 1 #GBS negative #T1DM: Monitoring CBGs using dexcom. On endotool. Will monitor #gHTN: BP wnl. No symptoms.  Linda Lai, DO 1:18 AM

## 2021-05-25 NOTE — Lactation Note (Signed)
This note was copied from a baby's chart. Lactation Consultation Note  Patient Name: Linda [name] MCNOB'S Date: 05/25/2021 Reason for consult: L&D Initial assessment;Early term 37-38.6wks;Primapara;1st time breastfeeding Age:21 hours   Initial L&D Consult:  Visited with family < 1 hour after birth Assisted baby "[name]" to latch, however, she was not at all interested in initiating a suck.  Gentle stimulation and breast compressions demonstrated.  Reassured parents that lactation services will be available on the M/B unit.  Allowed time for family bonding.     Maternal Data    Feeding Mother's Current Feeding Choice: Breast Milk  LATCH Score Latch: Too sleepy or reluctant, no latch achieved, no sucking elicited.  Audible Swallowing: None  Type of Nipple: Everted at rest and after stimulation (short shafted)  Comfort (Breast/Nipple): Soft / non-tender  Hold (Positioning): Assistance needed to correctly position infant at breast and maintain latch.  LATCH Score: 5   Lactation Tools Discussed/Used    Interventions Interventions: Assisted with latch;Skin to skin  Discharge    Consult Status Consult Status: Follow-up from L&D    Linda [name] 05/25/2021, 3:40 PM

## 2021-05-26 ENCOUNTER — Other Ambulatory Visit (HOSPITAL_COMMUNITY): Payer: Self-pay

## 2021-05-26 ENCOUNTER — Other Ambulatory Visit: Payer: Medicaid Other

## 2021-05-26 ENCOUNTER — Encounter: Payer: Medicaid Other | Admitting: Obstetrics & Gynecology

## 2021-05-26 MED ORDER — INSULIN GLARGINE-YFGN 100 UNIT/ML ~~LOC~~ SOLN
35.0000 [IU] | Freq: Every day | SUBCUTANEOUS | Status: DC
  Filled 2021-05-26: qty 0.35

## 2021-05-26 MED ORDER — NIFEDIPINE ER OSMOTIC RELEASE 30 MG PO TB24
30.0000 mg | ORAL_TABLET | Freq: Every day | ORAL | Status: DC
  Administered 2021-05-26 – 2021-05-27 (×2): 30 mg via ORAL
  Filled 2021-05-26 (×2): qty 1

## 2021-05-26 MED ORDER — IBUPROFEN 600 MG PO TABS
600.0000 mg | ORAL_TABLET | Freq: Four times a day (QID) | ORAL | 0 refills | Status: DC
  Filled 2021-05-26: qty 30, 8d supply, fill #0

## 2021-05-26 MED ORDER — INSULIN GLARGINE-YFGN 100 UNIT/ML ~~LOC~~ SOLN
20.0000 [IU] | Freq: Every day | SUBCUTANEOUS | Status: DC
Start: 2021-05-26 — End: 2021-05-27
  Administered 2021-05-26: 20 [IU] via SUBCUTANEOUS
  Filled 2021-05-26 (×4): qty 0.2

## 2021-05-26 MED ORDER — INSULIN ASPART 100 UNIT/ML IJ SOLN
1.0000 [IU] | Freq: Three times a day (TID) | INTRAMUSCULAR | Status: DC
Start: 2021-05-26 — End: 2021-05-27
  Administered 2021-05-26: 6 [IU] via SUBCUTANEOUS

## 2021-05-26 MED ORDER — ACETAMINOPHEN 325 MG PO TABS
650.0000 mg | ORAL_TABLET | ORAL | 0 refills | Status: AC | PRN
  Filled 2021-05-26: qty 60, 5d supply, fill #0

## 2021-05-26 MED ORDER — NORETHINDRONE 0.35 MG PO TABS
1.0000 | ORAL_TABLET | Freq: Every day | ORAL | 0 refills | Status: DC
  Filled 2021-05-26: qty 28, 28d supply, fill #0

## 2021-05-26 MED ORDER — NIFEDIPINE ER 30 MG PO TB24
30.0000 mg | ORAL_TABLET | Freq: Every day | ORAL | 0 refills | Status: DC
  Filled 2021-05-26: qty 60, 60d supply, fill #0

## 2021-05-26 MED ORDER — FUROSEMIDE 20 MG PO TABS
20.0000 mg | ORAL_TABLET | Freq: Two times a day (BID) | ORAL | 0 refills | Status: DC
  Filled 2021-05-26: qty 3, 2d supply, fill #0

## 2021-05-26 NOTE — Lactation Note (Signed)
This note was copied from a baby's chart. Lactation Consultation Note  Patient Name: Linda [name] JWJXB'[id number] Date: 05/26/2021 Reason for consult: Initial assessment;Mother's request;Difficult latch;1st time breastfeeding;Early term 37-38.6wks;Maternal endocrine disorder Age:21 hours Per mom, she really wants to breastfeed infant due her hx DM1. Per mom, infant did not latch in L&D, infant was given 13 mls of formula due low blood sugar. Per mom, she made few attempts on MBU but infant would not latch. Mom used hand pump prior to latch infant and pumped 3 mls of colostrum. LC observed mom has very flat nipples, mom made multiple attempts to latch infant, mom was fitted with 24 mm NS. Mom pre-pump breast with hand pump, then used 24 mm NS, infant latched on mom's right breast using the football hold position, breastfeed for 15 minutes, colostrum was present in NS. Infant was given 3 mls of colostrum and 3 mls of formula using slow flow bottle nipple.  Mom was shown how to use DEBP and was  still pumping when LC the  room,  mom had about 6 mls  EBM pumped. Mom made aware of O/P services, breastfeeding support groups, community resources, and our phone # for post-discharge questions.   Mom's plan: 1- Mom will pre-pump breast prior to applying 24 mm NS, mom will continue to breastfeed infant according to feeding cues, skin to skin. 2- Mom will ask  RN/LC for latch assistance if need or how to apply 24 mm NS. 3- Mom will give infant back any EBM first  after latching infant at the breast, before formula from using the DEBP, mom knows to pump every 3 hours for 15 minutes on initial setting.  Maternal Data Has patient been taught Hand Expression?: Yes Does the patient have breastfeeding experience prior to this delivery?: No  Feeding Mother's Current Feeding Choice: Breast Milk and Formula (Per mom, infant given formula due to low blood sugar.) Nipple Type: Slow - flow  LATCH Score Latch:  Grasps breast easily, tongue down, lips flanged, rhythmical sucking. (Infant sustained latch with 24 mm NS)  Audible Swallowing: A few with stimulation  Type of Nipple: Flat  Comfort (Breast/Nipple): Soft / non-tender  Hold (Positioning): Assistance needed to correctly position infant at breast and maintain latch.  LATCH Score: 7   Lactation Tools Discussed/Used Tools: Pump Breast pump type: Double-Electric Breast Pump;Manual Pump Education: Setup, frequency, and cleaning;Milk Storage Reason for Pumping: Mom pre-pump with hand pump prior to latching infant at breast using 24 mm NS, use DEBP due infant refusing to latch, infant having tight tissue connecting top lip to the gum is short and tight ( maxillary labial frenum) and infant not extending tongue past gumline when crying. Pumping frequency: Mom knows to pump every 3 hours for 15 minutes on inital setting. Pumped volume: 6 mL  Interventions Interventions: Breast feeding basics reviewed;Assisted with latch;Skin to skin;Hand express;Pre-pump if needed;Breast compression;Adjust position;Support pillows;Position options;Expressed milk;Hand pump;DEBP;Education;Pace feeding;LC Services brochure  Discharge Pump: DEBP;Manual WIC Program: Yes  Consult Status Consult Status: Follow-up Date: 05/26/21 Follow-up type: In-patient    Danelle Earthly 05/26/2021, 12:50 AM

## 2021-05-26 NOTE — Lactation Note (Signed)
This note was copied from a baby's chart. Lactation Consultation Note  Patient Name: Linda [name] GYKZL'D Date: 05/26/2021   Age:21 hours  LC came to help with breastfeeding. Mom sleeping at the time. LC to return later.  Mom taking Pristiq (L 3 [place] Slider book) and Buspar ( L3 [place] Slider book) prior to pregnancy. LC to encourage Mother to review these medications with Pediatrician.   Maternal Data    Feeding    LATCH Score                    Lactation Tools Discussed/Used    Interventions    Discharge    Consult Status      Linda Crudup  [name] 05/26/2021, 5:32 PM

## 2021-05-26 NOTE — Lactation Note (Signed)
This note was copied from a baby's chart. Lactation Consultation Note  Patient Name: Linda Genia Perin [name] Date: 05/26/2021 Reason for consult: Follow-up assessment;Mother's request;Difficult latch;Early term 37-38.6wks;Maternal endocrine disorder (Lasix, Nifedipine) Age:21 hours  Mom states not taking Pristiq or Buspar at this time. Mom concerned about her milk supply. We reviewed importance of supply and demand, Mom to post pump DEBP q 3hrs for 15 min.    Plan 1. To feed based on cues 8-12x 24hr period. Mom to offer breast and look for signs of milk transfer.  2. Mom to supplement with EBM first followed by formula. BF supplementation guide provided based on hrs of age since delivery. Mom aware if infant not latching at breast to offer more.  3. Mom to pump with DEBP as stated above.  All questions answered at the end of the visit.   Maternal Data Has patient been taught Hand Expression?: Yes  Feeding Mother's Current Feeding Choice: Breast Milk and Formula  LATCH Score Latch: Repeated attempts needed to sustain latch, nipple held in mouth throughout feeding, stimulation needed to elicit sucking reflex.  Audible Swallowing: Spontaneous and intermittent  Type of Nipple: Flat (will evert with stimulation)  Comfort (Breast/Nipple): Soft / non-tender  Hold (Positioning): Assistance needed to correctly position infant at breast and maintain latch.  LATCH Score: 7   Lactation Tools Discussed/Used Tools: Pump;Flanges Nipple shield size:  (Mom now able to latch without use of nipple shield.) Flange Size: 21 (Mom stated with 24 flanges nipple pain. For next pumping session, Mom try 21 flanges see if there is better result with smaller flange.) Breast pump type: Double-Electric Breast Pump Pump Education: Setup, frequency, and cleaning;Milk Storage Reason for Pumping: increase stimulation Pumping frequency: every 3 hrs for 15 min  Interventions Interventions: Breast  feeding basics reviewed;Support pillows;Education;Pace feeding;Position options;Assisted with latch;Skin to skin;Expressed milk;Breast massage;Infant Driven Feeding Algorithm education;Hand express;DEBP;Breast compression;Adjust position  Discharge    Consult Status Consult Status: Follow-up Date: 05/27/21 Follow-up type: In-patient    Linda Minion  [name] 05/26/2021, 7:47 PM

## 2021-05-26 NOTE — Social Work (Signed)
MOB was referred for history of depression/anxiety.  * Referral screened out by Clinical Social Worker because none of the following criteria appear to apply: ~ History of anxiety/depression during this pregnancy, or of post-partum depression following prior delivery. ~ Diagnosis of anxiety and/or depression within last 3 years OR * MOB's symptoms currently being treated with medication and/or therapy. Per chart review it is noted that MOB is on Pristiq and Buspar for depression and anxiety symptoms.   Please contact the Clinical Social Worker if needs arise, by [facility] request, or if MOB scores greater than 9/yes to question 10 on Edinburgh Postpartum Depression Screen.   Vivi Barrack, MSW, LCSW [facility]  Clinical Social Worker  [phone] 05/26/2021  8:20 AM

## 2021-05-26 NOTE — Progress Notes (Addendum)
Inpatient Diabetes Program Recommendations  AACE/ADA: New Consensus Statement on Inpatient Glycemic Control (2015)  Target Ranges:  Prepandial:   less than 140 mg/dL      Peak postprandial:   less than 180 mg/dL (1-2 hours)      Critically ill patients:  140 - 180 mg/dL   Lab Results  Component Value Date   GLUCAP 79 05/25/2021   HGBA1C 7.4 (H) 12/10/2020    Diabetes history: Type 1 DM  Outpatient Diabetes medications:   Current orders for Inpatient glycemic control:  Novolog sensitive tid with meals Semglee 20 units daily q HS   Inpatient Diabetes Program Recommendations:    Spoke with patient regarding post-delivery plan from Endocrinologist at [facility].  She states she was told to go back on her Tresiba 55 units daily and Novolog 1 unit for every 5 grams of CHO.    Discussed possible need for reduced insulin doses post-delivery and with breast feeding.  At discharge recommend 1 unit for every 10 grams of CHO and Tresiba 35 units q HS.   Needs orders for CHO coverage while in the hospital- 1 unit for every 10 grams of CHO.   Thanks,  Beryl Meager, RN, BC-ADM Inpatient Diabetes Coordinator Pager [phone]  (8a-5p)

## 2021-05-26 NOTE — Anesthesia Postprocedure Evaluation (Signed)
Anesthesia Post Note  Patient: Jonee LeAnne Markson  Procedure(s) Performed: AN AD HOC LABOR EPIDURAL     Patient location during evaluation: Mother Baby Anesthesia Type: Epidural Level of consciousness: awake and alert and oriented Pain management: satisfactory to patient Vital Signs Assessment: post-procedure vital signs reviewed and stable Respiratory status: respiratory function stable Cardiovascular status: stable Postop Assessment: no headache, no backache, epidural receding, patient able to bend at knees, no signs of nausea or vomiting, adequate PO intake and able to ambulate Anesthetic complications: no   No notable events documented.  Last Vitals:  Vitals:   05/26/21 0215 05/26/21 0553  BP: (!) 140/96   Pulse: 89 97  Resp: 16 17  Temp: 36.9 C 37 C  SpO2: 99% 97%    Last Pain:  Vitals:   05/26/21 0553  TempSrc: Oral  PainSc: 3    Pain Goal:                   Bernell Sigal

## 2021-05-26 NOTE — Plan of Care (Signed)
S: Patient's discharge was removed due to baby having to stay and not be able to be discharged. Plan for insulin adjustments today, per suggestions from inpatient diabetic coordinator.  O: Vitals:   05/26/21 0215 05/26/21 0553  BP: (!) 140/96   Pulse: 89 97  Resp: 16 17  Temp: 98.5 F (36.9 C) 98.6 F (37 C)  SpO2: 99% 97%    A/P: 20 y.o. is a G1P1001 at 69w2dwho delivered via NSVD a viable baby girl.   #Postpartum - Routine postpartum care - Met all milestones for discharge  #gHTN vs undiagnosed cHTN - Still had elevated BPs after delivery without evidence of preeclampsia - Added Lasix 274mBID x5 days - Added Procardia 3066maily - Plan for BP check in office in 1 week  #T1DM - Diabetes coordinator adjusting meds today; Semglee to be increased to 35 untis tonight, adding CHO coverage of 1 unit/10g CHO.  - Plan for home regimen until follows up with endocrine:  DECREASE Tresiba to 35 units qhs, and coverage for CHO at 1 unit/ 10g CHO.   #HypoTSH - Continue Synthroid - Plan to recheck TSH at postpartum visit   Linda BassetO [place]/22/2022  11:30 AM

## 2021-05-27 ENCOUNTER — Other Ambulatory Visit (HOSPITAL_COMMUNITY): Payer: Self-pay

## 2021-05-27 MED ORDER — TRESIBA FLEXTOUCH 100 UNIT/ML ~~LOC~~ SOPN: 20.0000 [IU] | PEN_INJECTOR | Freq: Every day | SUBCUTANEOUS | Status: DC

## 2021-05-27 MED ORDER — TRESIBA FLEXTOUCH 100 UNIT/ML ~~LOC~~ SOPN: 35.0000 [IU] | PEN_INJECTOR | Freq: Every day | SUBCUTANEOUS | Status: DC

## 2021-05-27 MED ORDER — NOVOLOG FLEXPEN 100 UNIT/ML ~~LOC~~ SOPN
PEN_INJECTOR | SUBCUTANEOUS | 11 refills | Status: DC
  Filled 2021-05-27: qty 15, 28d supply, fill #0

## 2021-05-27 NOTE — Progress Notes (Signed)
Post Partum Day 1 Subjective: Reports doing well. Would like to go home on Day 1 if possible. If baby is staying would like to stay as patient  Objective: Blood pressure 132/81, pulse 87, temperature 97.8 F (36.6 C), temperature source Oral, resp. rate 18, height 5\' 2"  (1.575 m), weight 101.6 kg, last menstrual period 08/30/2020, SpO2 97 %, unknown if currently breastfeeding.  Physical Exam:  General: alert Lochia: appropriate Uterine Fundus: firm DVT Evaluation: No evidence of DVT seen on physical exam.  No results for input(s): HGB, HCT in the last 72 hours.  Assessment/Plan:  Progressing in post partum course appropriately. NO acute concerns. Pain and bleeding well controlled - Plan for discharge tomorrow and if baby discharges today (Day 1) can DC today  #DM type I - Continue Semglee and SSI. Patient at times above goal in the 200s. Follow up diabetes educator recommendations.    09/01/2020, MD, MPH OB Fellow, [facility]

## 2021-05-27 NOTE — Progress Notes (Signed)
Patient reported another low blood sugar of 58 that has since resolved to 115 with eating. Declines wanting any insulin at this time. MD notified and will review chart and follow up with RN regarding D/C at this time. Royston Cowper, RN'

## 2021-05-27 NOTE — Progress Notes (Signed)
RN asked patient if she has checked blood sugar yet this morning/ ordered breakfast and she stated that it was low earlier this morning but was rising now. Pt said that the lowest her meter said was 77 but she did a finger stick and the reading was 53 @ 0651. No RN was notified at the time of the low reading. She said she drank juice and some sprite. The current blood sugar reading was 88 while I was in the room. Will continue to monitor. Royston Cowper, RN

## 2021-05-27 NOTE — Lactation Note (Signed)
This note was copied from a baby's chart. Lactation Consultation Note  Patient Name: Girl Marguita Venning FXJOI'T Date: 05/27/2021 Reason for consult: Follow-up assessment Age:21 hours P1, infant crying when LC arrived in the room. Mother reports that she just fed infant. Suggested that she try checking infants diaper and then check for feeding cues.  Infant cuing and suggested that mother breastfeed again.  Assist with latching infant . Taught mother to use tea cup hold. Mother independently latched infant . Infant sustained latch for 25 mins. Infant was given 10 ml of formula with a curved tip syringe at the breast.  Mother assist with using her hand pump with a #21 flange that fit well. Observed large flow of colostrum on the breast and coming into the tunnel.  Advised mother to pump for 15 mins on each breast after each feeding.  Mother reports that she is getting  a pump when she leaves hospital.  Mother is active with WIC.  Reviewed basics again. Cue base feeding. STS and cluster feeding. Encouraged parents to attend to each of infants crys .  Discussed engorgement treatment and prevention.  Suggested to follow up with [facility] OP DEPT. Put in for a appt.  Mother is aware of available resources.    Maternal Data    Feeding Mother's Current Feeding Choice: Breast Milk  LATCH Score Latch: Grasps breast easily, tongue down, lips flanged, rhythmical sucking.  Audible Swallowing: Spontaneous and intermittent  Type of Nipple: Everted at rest and after stimulation  Comfort (Breast/Nipple): Soft / non-tender  Hold (Positioning): Assistance needed to correctly position infant at breast and maintain latch.  LATCH Score: 9   Lactation Tools Discussed/Used    Interventions Interventions: Breast feeding basics reviewed;Assisted with latch;Skin to skin;Hand express;Adjust position;Support pillows;Breast compression;Position options;Education  Discharge Discharge Education: Engorgement  and breast care;Warning signs for feeding baby;Outpatient recommendation;Outpatient Epic message sent  Consult Status Consult Status: Complete    Michel Bickers 05/27/2021, 12:40 PM

## 2021-05-29 ENCOUNTER — Telehealth: Payer: Self-pay

## 2021-05-29 NOTE — Telephone Encounter (Signed)
Transition Care Management Unsuccessful Follow-up Telephone Call  Date of discharge and from where:  05/27/2021-[facility]  Attempts:  1st Attempt  Reason for unsuccessful TCM follow-up call:  Left voice message

## 2021-06-01 NOTE — Telephone Encounter (Signed)
Transition Care Management Follow-up Telephone Call Date of discharge and from where: 05/27/2021-[facility]  How have you been since you were released from the hospital? Patient stated she is doing good and recovering fine.  Any questions or concerns? No  Items Reviewed: Did the pt receive and understand the discharge instructions provided? Yes  Medications obtained and verified? Yes  Other? No  Any new allergies since your discharge? No  Dietary orders reviewed? No Do you have support at home? Yes   Home Care and Equipment/Supplies: Were home health services ordered? not applicable If so, what is the name of the agency? N/A  Has the agency set up a time to come to the patient's home? not applicable Were any new equipment or medical supplies ordered?  No What is the name of the medical supply agency? N/A Were you able to get the supplies/equipment? not applicable Do you have any questions related to the use of the equipment or supplies? No  Functional Questionnaire: (I = Independent and D = Dependent) ADLs: I  Bathing/Dressing- I  Meal Prep- I  Eating- I  Maintaining continence- I  Transferring/Ambulation- I  Managing Meds- I  Follow up appointments reviewed:  PCP Hospital f/u appt confirmed? No   Specialist Hospital f/u appt confirmed? Yes  Scheduled to see OBGYN on 07/09/2001 @ 11:30AM. Are transportation arrangements needed? No  If their condition worsens, is the pt aware to call PCP or go to the Emergency Dept.? Yes Was the patient provided with contact information for the PCP's office or ED? Yes Was to pt encouraged to call back with questions or concerns? Yes

## 2021-06-02 ENCOUNTER — Other Ambulatory Visit: Payer: Medicaid Other

## 2021-06-02 ENCOUNTER — Encounter: Payer: Medicaid Other | Admitting: Obstetrics & Gynecology

## 2021-06-03 ENCOUNTER — Ambulatory Visit (INDEPENDENT_AMBULATORY_CARE_PROVIDER_SITE_OTHER): Payer: Medicaid Other | Admitting: *Deleted

## 2021-06-03 ENCOUNTER — Other Ambulatory Visit: Payer: Self-pay

## 2021-06-03 ENCOUNTER — Telehealth: Payer: Self-pay | Admitting: *Deleted

## 2021-06-03 DIAGNOSIS — Z0130 Encounter for examination of blood pressure without abnormal findings: Secondary | ICD-10-CM

## 2021-06-03 NOTE — Telephone Encounter (Signed)
Received a voicemail from Linda [name] from 11:26 am today stating she missed a call. Per chart was seen at [facility] today. Will route to that office. [name],RN

## 2021-06-03 NOTE — Progress Notes (Addendum)
   NURSE VISIT- BLOOD PRESSURE CHECK  SUBJECTIVE:  Linda [name] is a 20 y.o. G43P1001 female here for BP check. She is postpartum, delivery date 05/25/21     HYPERTENSION ROS:  Pregnant/postpartum:  Severe headaches that don't go away with tylenol/other medicines: No  Visual changes (seeing spots/double/blurred vision) No  Severe pain under right breast breast or in center of upper chest No  Severe nausea/vomiting No  Taking medicines as instructed yes, takes at night.    OBJECTIVE:  BP (!) 126/91 (BP Location: Right Arm, Patient Position: Sitting, Cuff Size: Normal)   Pulse (!) 108   LMP 08/30/2020 (Exact Date)   Breastfeeding Yes   Appearance alert, well appearing, and in no distress.  ASSESSMENT: Postpartum  blood pressure check  PLAN: Discussed with [name]  Recommendations: stop medicine 2 days before next visit   Follow-up: as scheduled   [name]  06/03/2021 10:52 AM   Chart reviewed for nurse visit. Agree with plan of care.  [name], CNM 06/03/2021 12:58 PM

## 2021-06-05 ENCOUNTER — Other Ambulatory Visit: Payer: Medicaid Other

## 2021-06-06 ENCOUNTER — Telehealth (HOSPITAL_COMMUNITY): Payer: Self-pay | Admitting: *Deleted

## 2021-06-06 NOTE — Telephone Encounter (Signed)
Hospital Discharge Follow-Up Call:  Patient reports that she is doing well and has no concerns about her healing process.  EPDS today was 7 and she endorses this accurately reflects that she is doing well emotionally overall.  Patient says that baby is doing well and jaundice is resolving with sunlight and formula supplementation along with breastfeeding.  Baby to be seen again by pediatrician next week.  She reports that baby sleeps in a bassinet.  Reviewed ABCs of Safe Sleep.

## 2021-06-23 DIAGNOSIS — E109 Type 1 diabetes mellitus without complications: Secondary | ICD-10-CM | POA: Diagnosis not present

## 2021-06-26 ENCOUNTER — Other Ambulatory Visit: Payer: Self-pay | Admitting: Family Medicine

## 2021-06-26 DIAGNOSIS — E785 Hyperlipidemia, unspecified: Secondary | ICD-10-CM | POA: Diagnosis not present

## 2021-06-26 DIAGNOSIS — Z794 Long term (current) use of insulin: Secondary | ICD-10-CM | POA: Diagnosis not present

## 2021-06-26 DIAGNOSIS — Z7984 Long term (current) use of oral hypoglycemic drugs: Secondary | ICD-10-CM | POA: Diagnosis not present

## 2021-06-26 DIAGNOSIS — E1065 Type 1 diabetes mellitus with hyperglycemia: Secondary | ICD-10-CM | POA: Diagnosis not present

## 2021-07-07 ENCOUNTER — Ambulatory Visit (INDEPENDENT_AMBULATORY_CARE_PROVIDER_SITE_OTHER): Payer: Medicaid Other | Admitting: Nurse Practitioner

## 2021-07-07 ENCOUNTER — Encounter: Payer: Self-pay | Admitting: Nurse Practitioner

## 2021-07-07 VITALS — BP 120/74 | HR 94 | Temp 98.2°F | Ht 62.0 in | Wt 194.4 lb

## 2021-07-07 DIAGNOSIS — M25562 Pain in left knee: Secondary | ICD-10-CM

## 2021-07-07 DIAGNOSIS — F419 Anxiety disorder, unspecified: Secondary | ICD-10-CM

## 2021-07-07 DIAGNOSIS — F32A Depression, unspecified: Secondary | ICD-10-CM | POA: Diagnosis not present

## 2021-07-07 MED ORDER — CYCLOBENZAPRINE HCL 5 MG PO TABS: 5.0000 mg | ORAL_TABLET | Freq: Three times a day (TID) | ORAL | 1 refills | Status: DC | PRN

## 2021-07-07 MED ORDER — IBUPROFEN 600 MG PO TABS: 600.0000 mg | ORAL_TABLET | Freq: Three times a day (TID) | ORAL | 0 refills | Status: DC | PRN

## 2021-07-07 NOTE — Patient Instructions (Signed)
Acute Knee Pain, Adult °Many things can cause knee pain. Sometimes, knee pain is sudden (acute) and may be caused by damage, swelling, or irritation of the muscles and tissues that support your knee. °The pain often goes away on its own with time and rest. If the pain does not go away, tests may be done to find out what is causing the pain. °Follow these instructions at home: °If you have a knee sleeve or brace: ° °Wear the knee sleeve or brace as told by your doctor. Take it off only as told by your doctor. °Loosen it if your toes: °Tingle. °Become numb. °Turn cold and blue. °Keep it clean. °If the knee sleeve or brace is not waterproof: °Do not let it get wet. °Cover it with a watertight covering when you take a bath or shower. °Activity °Rest your knee. °Do not do things that cause pain or make pain worse. °Avoid activities where both feet leave the ground at the same time (high-impact activities). Examples are running, jumping rope, and doing jumping jacks. °Work with a physical therapist to make a safe exercise program, as told by your doctor. °Managing pain, stiffness, and swelling ° °If told, put ice on the knee. To do this: °If you have a removable knee sleeve or brace, take it off as told by your doctor. °Put ice in a plastic bag. °Place a towel between your skin and the bag. °Leave the ice on for 20 minutes, 2-3 times a day. °Take off the ice if your skin turns bright red. This is very important. If you cannot feel pain, heat, or cold, you have a greater risk of damage to the area. °If told, use an elastic bandage to put pressure (compression) on your injured knee. °Raise your knee above the level of your heart while you are sitting or lying down. °Sleep with a pillow under your knee. °General instructions °Take over-the-counter and prescription medicines only as told by your doctor. °Do not smoke or use any products that contain nicotine or tobacco. If you need help quitting, ask your doctor. °If you are  overweight, work with your doctor and a food expert (dietitian) to set goals to lose weight. Being overweight can make your knee hurt more. °Watch for any changes in your symptoms. °Keep all follow-up visits. °Contact a doctor if: °The knee pain does not stop. °The knee pain changes or gets worse. °You have a fever along with knee pain. °Your knee is red or feels warm when you touch it. °Your knee gives out or locks up. °Get help right away if: °Your knee swells, and the swelling gets worse. °You cannot move your knee. °You have very bad knee pain that does not get better with pain medicine. °Summary °Many things can cause knee pain. The pain often goes away on its own with time and rest. °Your doctor may do tests to find out the cause of the pain. °Watch for any changes in your symptoms. Relieve your pain with rest, medicines, light activity, and use of ice. °Get help right away if you cannot move your knee or your knee pain is very bad. °This information is not intended to replace advice given to you by your health care provider. Make sure you discuss any questions you have with your health care provider. °Document Revised: 12/05/2019 Document Reviewed: 12/05/2019 °Elsevier Patient Education © 2022 [employer] Inc. ° °

## 2021-07-07 NOTE — Progress Notes (Signed)
Acute Office Visit  Subjective:    Patient ID: Linda [name], female    DOB: 2001/07/02, 21 y.o.   MRN: [id number]  Chief Complaint  Patient presents with   Knee Pain    Knee Pain  The incident occurred more than 1 week ago. The incident occurred at home. There was no injury mechanism. The pain is present in the left knee. The quality of the pain is described as aching. The pain is at a severity of 5/10. The pain is moderate. The pain has been Intermittent since onset. Pertinent negatives include no loss of motion, loss of sensation, numbness or tingling. She reports no foreign bodies present. The symptoms are aggravated by movement. She has tried nothing for the symptoms.  Patient recently had a baby and attributes knee pain to legs been held at an awkward position for too long during delivery.  Past Medical History:  Diagnosis Date   Allergy    Anxiety    Asthma    Carpal tunnel syndrome    Depression    Diabetes mellitus without complication ([place]) 9741   Type 1   Tachycardia    Tinnitus     Past Surgical History:  Procedure Laterality Date   ADENOIDECTOMY     TONSILLECTOMY     WISDOM TOOTH EXTRACTION      Family History  Problem Relation Age of Onset   Hypertension Mother    Thyroid disease Mother    Asthma Brother    Thyroid disease Brother    COPD Paternal Grandmother    Hypertension Paternal Grandmother    COPD Maternal Grandmother    Lumbar disc disease Maternal Grandmother    Dementia Maternal Grandmother    Heart attack Maternal Grandfather    Emphysema Maternal Grandfather    Ankylosing spondylitis Maternal Grandfather    Dementia Maternal Grandfather     Social History   Socioeconomic History   Marital status: Soil scientist    Spouse name: Not on file   Number of children: Not on file   Years of education: Not on file   Highest education level: Not on file  Occupational History   Not on file  Tobacco Use   Smoking status: Never     Passive exposure: Yes   Smokeless tobacco: Never  Vaping Use   Vaping Use: Former  Substance and Sexual Activity   Alcohol use: No   Drug use: No   Sexual activity: Yes    Birth control/protection: None  Other Topics Concern   Not on file  Social History Narrative   Not on file   Social Determinants of Health   Financial Resource Strain: Low Risk    Difficulty of Paying Living Expenses: Not hard at all  Food Insecurity: No Food Insecurity   Worried About Charity fundraiser in the Last Year: Never true   [place] in the Last Year: Never true  Transportation Needs: No Transportation Needs   Lack of Transportation (Medical): No   Lack of Transportation (Non-Medical): No  Physical Activity: Inactive   Days of Exercise per Week: 0 days   Minutes of Exercise per Session: 0 min  Stress: Stress Concern Present   Feeling of Stress : Very much  Social Connections: Moderately Isolated   Frequency of Communication with Friends and Family: More than three times a week   Frequency of Social Gatherings with Friends and Family: More than three times a week   Attends Religious Services: Never  Active Member of Clubs or Organizations: No   Attends Archivist Meetings: Never   Marital Status: Living with partner  Intimate Partner Violence: Not At Risk   Fear of Current or Ex-Partner: No   Emotionally Abused: No   Physically Abused: No   Sexually Abused: No    Outpatient Medications Prior to Visit  Medication Sig Dispense Refill   acetaminophen (TYLENOL) 325 MG tablet Take 2 tablets (650 mg total) by mouth every 4 (four) hours as needed (for pain scale < 4). 60 tablet 0   BD PEN NEEDLE NANO 2ND GEN 32G X 4 MM MISC SMARTSIG:Injection     Continuous Blood Gluc Sensor (DEXCOM G6 SENSOR) MISC SMARTSIG:Topical Every 10 Days     glucagon 1 MG injection Inject 1 mg into the muscle once as needed (for blood sugar).     insulin degludec (TRESIBA FLEXTOUCH) 100 UNIT/ML  FlexTouch Pen Inject 20 Units into the skin daily.     levothyroxine (SYNTHROID) 25 MCG tablet Take 1 tablet (25 mcg total) by mouth daily before breakfast. 90 tablet 3   MELATONIN PO Take by mouth as needed.     metFORMIN (GLUCOPHAGE) 500 MG tablet Take 500 mg by mouth 2 (two) times daily with a meal.     NIFEdipine (ADALAT CC) 30 MG 24 hr tablet Take 1 tablet (30 mg total) by mouth daily. 60 tablet 0   NOVOLOG FLEXPEN 100 UNIT/ML FlexPen Inject 1 unit for every 10g of carbs with every snack or meal. 15 mL 11   ibuprofen (ADVIL) 600 MG tablet Take 1 tablet (600 mg total) by mouth every 6 (six) hours. 30 tablet 0   norethindrone (ORTHO MICRONOR) 0.35 MG tablet Take 1 tablet (0.35 mg total) by mouth daily. (Patient not taking: Reported on 06/03/2021) 56 tablet 0   furosemide (LASIX) 20 MG tablet Take 1 tablet (20 mg total) by mouth 2 (two) times daily for 3 doses. 3 tablet 0   Prenatal Vit-Fe Fumarate-FA (PRENATAL VITAMIN PO) Take 1 tablet by mouth daily.     No facility-administered medications prior to visit.    No Known Allergies  Review of Systems  Constitutional: Negative.   HENT: Negative.    Eyes: Negative.   Respiratory: Negative.    Cardiovascular: Negative.   Gastrointestinal: Negative.   Genitourinary: Negative.   Musculoskeletal:        Left knee tenderness  Skin:  Negative for rash.  Neurological:  Negative for tingling and numbness.  All other systems reviewed and are negative.     Objective:    Physical Exam Vitals and nursing note reviewed.  HENT:     Head: Normocephalic.     Right Ear: Ear canal and external ear normal.     Left Ear: Ear canal and external ear normal.     Nose: Nose normal.  Eyes:     Conjunctiva/sclera: Conjunctivae normal.  Cardiovascular:     Rate and Rhythm: Normal rate and regular rhythm.     Pulses: Normal pulses.     Heart sounds: Normal heart sounds.  Pulmonary:     Effort: Pulmonary effort is normal.     Breath sounds: Normal  breath sounds.  Abdominal:     General: Bowel sounds are normal.  Musculoskeletal:     Left knee: No swelling or erythema. Normal range of motion. Tenderness present.  Skin:    General: Skin is warm.     Findings: No rash.  Neurological:  Mental Status: She is oriented to person, place, and time.  Psychiatric:        Behavior: Behavior normal.    BP 120/74    Pulse 94    Temp 98.2 F (36.8 C) (Temporal)    Ht '5\' 2"'  (1.575 m)    Wt 194 lb 6 oz (88.2 kg)    BMI 35.55 kg/m  Wt Readings from Last 3 Encounters:  07/07/21 194 lb 6 oz (88.2 kg)  05/23/21 223 lb 15.8 oz (101.6 kg)  05/19/21 224 lb (101.6 kg)    Health Maintenance Due  Topic Date Due   OPHTHALMOLOGY EXAM  Never done   URINE MICROALBUMIN  Never done   COVID-19 Vaccine (3 - Booster for Moderna series) 05/06/2020   PAP-Cervical Cytology Screening  Never done   PAP SMEAR-Modifier  Never done   HEMOGLOBIN A1C  06/11/2021    There are no preventive care reminders to display for this patient.   Lab Results  Component Value Date   TSH 5.280 (H) 04/28/2021   Lab Results  Component Value Date   WBC 8.1 05/24/2021   HGB 9.5 (L) 05/24/2021   HCT 30.3 (L) 05/24/2021   MCV 84.9 05/24/2021   PLT 190 05/24/2021   Lab Results  Component Value Date   NA 134 (L) 05/23/2021   K 3.3 (L) 05/23/2021   CO2 18 (L) 05/23/2021   GLUCOSE 141 (H) 05/23/2021   BUN 5 (L) 05/23/2021   CREATININE 0.78 05/23/2021   BILITOT 0.3 05/23/2021   ALKPHOS 138 (H) 05/23/2021   AST 15 05/23/2021   ALT 12 05/23/2021   PROT 4.8 (L) 05/23/2021   ALBUMIN 1.7 (L) 05/23/2021   CALCIUM 8.3 (L) 05/23/2021   ANIONGAP 8 05/23/2021   EGFR 133 04/01/2021   GFR 88.02 08/15/2018   No results found for: CHOL No results found for: HDL No results found for: LDLCALC No results found for: TRIG No results found for: CHOLHDL Lab Results  Component Value Date   HGBA1C 7.4 (H) 12/10/2020       Assessment & Plan:   Problem List Items Addressed  This Visit       Other   Anxiety and depression    Completed PHQ-9 and GAD-7 patient verbalizes understanding about depression and anxiety and would not like any treatment at this time. patient attributes depression and anxiety to her knew baby and will most likely get over it. I advised patient to please follow up if symptoms get worse. patient verbalize understanding.        Other Visit Diagnoses     Acute pain of left knee    -  Primary   Relevant Medications   ibuprofen (ADVIL) 600 MG tablet   cyclobenzaprine (FLEXERIL) 5 MG tablet        Meds ordered this encounter  Medications   ibuprofen (ADVIL) 600 MG tablet    Sig: Take 1 tablet (600 mg total) by mouth every 8 (eight) hours as needed.    Dispense:  30 tablet    Refill:  0    Order Specific Question:   Supervising Provider    Answer:   Claretta Fraise [[id number]]   cyclobenzaprine (FLEXERIL) 5 MG tablet    Sig: Take 1 tablet (5 mg total) by mouth 3 (three) times daily as needed for muscle spasms.    Dispense:  30 tablet    Refill:  1    Order Specific Question:   Supervising Provider    Answer:  [place], [place] [[id number]]     Ivy Lynn, NP

## 2021-07-07 NOTE — Assessment & Plan Note (Signed)
Completed PHQ-9 and GAD-7 patient verbalizes understanding about depression and anxiety and would not like any treatment at this time. patient attributes depression and anxiety to her knew baby and will most likely get over it. I advised patient to please follow up if symptoms get worse. patient verbalize understanding.

## 2021-07-08 ENCOUNTER — Ambulatory Visit: Payer: Medicaid Other | Admitting: Advanced Practice Midwife

## 2021-07-09 ENCOUNTER — Ambulatory Visit: Payer: Medicaid Other | Admitting: Women's Health

## 2021-07-17 ENCOUNTER — Encounter: Payer: Self-pay | Admitting: Medical

## 2021-07-17 ENCOUNTER — Ambulatory Visit (INDEPENDENT_AMBULATORY_CARE_PROVIDER_SITE_OTHER): Payer: Medicaid Other | Admitting: Medical

## 2021-07-17 ENCOUNTER — Other Ambulatory Visit: Payer: Self-pay

## 2021-07-17 ENCOUNTER — Other Ambulatory Visit (HOSPITAL_COMMUNITY)
Admission: RE | Admit: 2021-07-17 | Discharge: 2021-07-17 | Disposition: A | Discharge status: Home / Self Care | Payer: Medicaid Other | Source: Ambulatory Visit | Attending: Medical | Admitting: Medical

## 2021-07-17 VITALS — BP 124/78 | HR 109 | Ht 62.0 in | Wt 199.2 lb

## 2021-07-17 DIAGNOSIS — Z124 Encounter for screening for malignant neoplasm of cervix: Secondary | ICD-10-CM | POA: Insufficient documentation

## 2021-07-17 DIAGNOSIS — Z1331 Encounter for screening for depression: Secondary | ICD-10-CM

## 2021-07-17 NOTE — Progress Notes (Signed)
Post Partum Visit Note  Linda [name] is a 21 y.o. G44P1001 female who presents for a postpartum visit. She is 8 weeks postpartum following a normal spontaneous vaginal delivery.  I have fully reviewed the prenatal and intrapartum course. The delivery was at 38.2 gestational weeks.  Anesthesia: epidural. Postpartum course has been uncomplicated. Baby is doing well. Baby is feeding by bottle - Daron Offer . Bleeding no bleeding. Bowel function is abnormal: occasional intermittent constipation and diarrhea . Bladder function is normal. Patient is sexually active. Contraception method is none. Postpartum depression screening: positive.    Upstream - 07/17/21 0935       Pregnancy Intention Screening   Does the patient want to become pregnant in the next year? No    Does the patient's partner want to become pregnant in the next year? No    Would the patient like to discuss contraceptive options today? Yes      Contraception Wrap Up   Current Method Female Condom    End Method Oral Contraceptive    Contraception Counseling Provided Yes            The pregnancy intention screening data noted above was reviewed. Potential methods of contraception were discussed. The patient elected to proceed with Oral Contraceptive.   Edinburgh Postnatal Depression Scale - 07/17/21 0935       Edinburgh Postnatal Depression Scale:  In the Past 7 Days   I have been able to laugh and see the funny side of things. 0    I have looked forward with enjoyment to things. 0    I have blamed myself unnecessarily when things went wrong. 3    I have been anxious or worried for no good reason. 3    I have felt scared or panicky for no good reason. 2    Things have been getting on top of me. 2    I have been so unhappy that I have had difficulty sleeping. 1    I have felt sad or miserable. 1    I have been so unhappy that I have been crying. 1    The thought of harming myself has occurred to me. 0     Edinburgh Postnatal Depression Scale Total 13             Health Maintenance Due  Topic Date Due   OPHTHALMOLOGY EXAM  Never done   URINE MICROALBUMIN  Never done   COVID-19 Vaccine (3 - Booster for Moderna series) 05/06/2020   PAP-Cervical Cytology Screening  Never done   PAP SMEAR-Modifier  Never done   HEMOGLOBIN A1C  06/11/2021    The following portions of the patient's history were reviewed and updated as appropriate: allergies, current medications, past family history, past medical history, past social history, past surgical history, and problem list.  Review of Systems Pertinent items are noted in HPI.  Objective:  BP 124/78 (BP Location: Right Arm, Patient Position: Sitting, Cuff Size: Normal)    Pulse (!) 109    Ht 5\' 2"  (1.575 m)    Wt 199 lb 3.2 oz (90.4 kg)    Breastfeeding No    BMI 36.43 kg/m    General:  alert and cooperative   Breasts:  not indicated  Lungs: clear to auscultation bilaterally  Heart:  regular rate and rhythm, S1, S2 normal, no murmur, click, rub or gallop  Abdomen: soft, non-tender; bowel sounds normal; no masses,  no organomegaly   Wound N/A  GU exam:  normal       Assessment:    1. Routine cervical smear - First pap smear today, will contact with results via MyChart  2. Routine postpartum follow-up  3. History of GHTN  4. Pre-existing DM   Normal postpartum exam.   Plan:   Essential components of care per ACOG recommendations:  1.  Mood and well being: Patient with positive depression screening today. Reviewed local resources for support.  - Referral placed for [facility] - Patient tobacco use? No.   - hx of drug use? No.    2. Infant care and feeding:  -Patient currently breastmilk feeding? No.  -Social determinants of health (SDOH) reviewed in EPIC. No concerns  3. Sexuality, contraception and birth spacing - Patient does not want a pregnancy in the next year.  Desired family size is 2 children.  - Reviewed forms of  contraception in tiered fashion. Patient desired oral progesterone-only contraceptive today.   - Discussed birth spacing of 18 months  4. Sleep and fatigue -Encouraged family/partner/community support of 4 hrs of uninterrupted sleep to help with mood and fatigue  5. Physical Recovery  - Discussed patients delivery and complications. She describes her labor as good. - Patient had a Vaginal, no problems at delivery. Patient had a 1st degree laceration. Perineal healing reviewed. Patient expressed understanding - Patient has urinary incontinence? No. - Patient is safe to resume physical and sexual activity  6.  Health Maintenance - HM due items addressed Yes - Last pap smear: None  Pap smear done at today's visit.  -Breast Cancer screening indicated? No.   7. Chronic Disease/Pregnancy Condition follow up: Hypertension and Gestational Diabetes - PCP and endocrine follow up  Vonzella Nipple, PA-C [facility] [employer]

## 2021-07-20 LAB — CYTOLOGY - PAP
Adequacy: ABSENT
Diagnosis: NEGATIVE

## 2021-07-23 ENCOUNTER — Ambulatory Visit: Payer: Medicaid Other | Admitting: Women's Health

## 2021-07-24 ENCOUNTER — Ambulatory Visit (INDEPENDENT_AMBULATORY_CARE_PROVIDER_SITE_OTHER): Payer: Medicaid Other | Admitting: Family Medicine

## 2021-07-24 ENCOUNTER — Encounter: Payer: Self-pay | Admitting: Family Medicine

## 2021-07-24 DIAGNOSIS — U071 COVID-19: Secondary | ICD-10-CM

## 2021-07-24 MED ORDER — ONDANSETRON 4 MG PO TBDP: 4.0000 mg | ORAL_TABLET | Freq: Three times a day (TID) | ORAL | 0 refills | Status: DC | PRN

## 2021-07-24 MED ORDER — DEXAMETHASONE 6 MG PO TABS: 6.0000 mg | ORAL_TABLET | Freq: Every day | ORAL | 0 refills | Status: AC

## 2021-07-24 MED ORDER — MOLNUPIRAVIR EUA 200MG CAPSULE: 4.0000 | ORAL_CAPSULE | Freq: Two times a day (BID) | ORAL | 0 refills | Status: AC

## 2021-07-24 NOTE — BH Specialist Note (Signed)
Pt did not arrive to video visit and did not answer the phone; Left HIPPA-compliant message to call back Talin Rozeboom from [facility]  [facility] at  [phone] (Sibyl Mikula's office).  ?; left MyChart message for patient.  ? ?

## 2021-07-24 NOTE — Progress Notes (Signed)
Virtual Visit via Telephone Note  I connected with Linda [name] on 07/24/21 at 9:00 AM by telephone and verified that I am speaking with the correct person using two identifiers. Linda [name] is currently located at home and his fiance is currently with her during this visit. The provider, Loman Brooklyn, FNP is located in their office at time of visit.  I discussed the limitations, risks, security and privacy concerns of performing an evaluation and management service by telephone and the availability of in person appointments. I also discussed with the patient that there may be a patient responsible charge related to this service. The patient expressed understanding and agreed to proceed.  Subjective: PCP: Janora Norlander, DO  Chief Complaint  Patient presents with   Covid Positive   Patient complains of head/chest congestion, headache, runny nose, sore throat, fever, postnasal drainage, nausea, and body aches . Onset of symptoms was 1 day ago, unchanged since that time. She is drinking plenty of fluids. Evaluation to date: at home COVID test positive. Treatment to date:  Tylenol . She has a history of asthma. She does not smoke.    ROS: Per HPI  Current Outpatient Medications:    acetaminophen (TYLENOL) 325 MG tablet, Take 2 tablets (650 mg total) by mouth every 4 (four) hours as needed (for pain scale < 4)., Disp: 60 tablet, Rfl: 0   BD PEN NEEDLE NANO 2ND GEN 32G X 4 MM MISC, SMARTSIG:Injection, Disp: , Rfl:    Continuous Blood Gluc Sensor (DEXCOM G6 SENSOR) MISC, SMARTSIG:Topical Every 10 Days, Disp: , Rfl:    cyclobenzaprine (FLEXERIL) 5 MG tablet, Take 1 tablet (5 mg total) by mouth 3 (three) times daily as needed for muscle spasms., Disp: 30 tablet, Rfl: 1   glucagon 1 MG injection, Inject 1 mg into the muscle once as needed (for blood sugar)., Disp: , Rfl:    ibuprofen (ADVIL) 600 MG tablet, Take 1 tablet (600 mg total) by mouth every 8 (eight) hours as  needed., Disp: 30 tablet, Rfl: 0   insulin degludec (TRESIBA FLEXTOUCH) 100 UNIT/ML FlexTouch Pen, Inject 20 Units into the skin daily., Disp: , Rfl:    levothyroxine (SYNTHROID) 25 MCG tablet, Take 1 tablet (25 mcg total) by mouth daily before breakfast., Disp: 90 tablet, Rfl: 3   MELATONIN PO, Take by mouth as needed., Disp: , Rfl:    metFORMIN (GLUCOPHAGE) 500 MG tablet, Take 500 mg by mouth 2 (two) times daily with a meal., Disp: , Rfl:    NIFEdipine (ADALAT CC) 30 MG 24 hr tablet, Take 1 tablet (30 mg total) by mouth daily., Disp: 60 tablet, Rfl: 0   norethindrone (ORTHO MICRONOR) 0.35 MG tablet, Take 1 tablet (0.35 mg total) by mouth daily. (Patient not taking: Reported on 07/17/2021), Disp: 56 tablet, Rfl: 0   NOVOLOG FLEXPEN 100 UNIT/ML FlexPen, Inject 1 unit for every 10g of carbs with every snack or meal., Disp: 15 mL, Rfl: 11  No Known Allergies Past Medical History:  Diagnosis Date   Allergy    Anxiety    Asthma    Carpal tunnel syndrome    Depression    Diabetes mellitus without complication ([facility]) 123456   Type 1   Tachycardia    Tinnitus     Observations/Objective: A&O  No respiratory distress or wheezing audible over the phone Mood, judgement, and thought processes all WNL  Assessment and Plan: 1. COVID-19 Encouraged adequate hydration.  - molnupiravir EUA (LAGEVRIO) 200 mg  CAPS capsule; Take 4 capsules (800 mg total) by mouth 2 (two) times daily for 5 days.  Dispense: 40 capsule; Refill: 0 - ondansetron (ZOFRAN-ODT) 4 MG disintegrating tablet; Take 1 tablet (4 mg total) by mouth every 8 (eight) hours as needed for nausea or vomiting.  Dispense: 20 tablet; Refill: 0 - dexamethasone (DECADRON) 6 MG tablet; Take 1 tablet (6 mg total) by mouth daily for 5 days.  Dispense: 5 tablet; Refill: 0   Follow Up Instructions:  I discussed the assessment and treatment plan with the patient. The patient was provided an opportunity to ask questions and all were answered. The  patient agreed with the plan and demonstrated an understanding of the instructions.   The patient was advised to call back or seek an in-person evaluation if the symptoms worsen or if the condition fails to improve as anticipated.  The above assessment and management plan was discussed with the patient. The patient verbalized understanding of and has agreed to the management plan. Patient is aware to call the clinic if symptoms persist or worsen. Patient is aware when to return to the clinic for a follow-up visit. Patient educated on when it is appropriate to go to the emergency department.   Time call ended: 9:11 AM  I provided 11 minutes of non-face-to-face time during this encounter.  Hendricks Limes, MSN, APRN, FNP-C [facility] 07/24/21

## 2021-07-28 DIAGNOSIS — E109 Type 1 diabetes mellitus without complications: Secondary | ICD-10-CM | POA: Diagnosis not present

## 2021-08-06 ENCOUNTER — Other Ambulatory Visit: Payer: Self-pay | Admitting: Nurse Practitioner

## 2021-08-06 ENCOUNTER — Ambulatory Visit: Payer: Medicaid Other | Admitting: Clinical

## 2021-08-06 DIAGNOSIS — Z77120 Contact with and (suspected) exposure to mold (toxic): Secondary | ICD-10-CM

## 2021-08-06 DIAGNOSIS — Z91199 Patient's noncompliance with other medical treatment and regimen due to unspecified reason: Secondary | ICD-10-CM

## 2021-08-14 ENCOUNTER — Encounter: Payer: Self-pay | Admitting: Advanced Practice Midwife

## 2021-08-14 ENCOUNTER — Other Ambulatory Visit: Payer: Self-pay | Admitting: Advanced Practice Midwife

## 2021-08-14 ENCOUNTER — Telehealth: Payer: Self-pay | Admitting: Advanced Practice Midwife

## 2021-08-14 MED ORDER — NORETHINDRONE 0.35 MG PO TABS: 1.0000 | ORAL_TABLET | Freq: Every day | ORAL | 12 refills | Status: DC

## 2021-08-14 NOTE — Telephone Encounter (Signed)
Patient needs a refill of her birth control sent to her pharmacy. She uses [facility] in [place]. Please advise.

## 2021-08-17 ENCOUNTER — Other Ambulatory Visit: Payer: Self-pay | Admitting: Advanced Practice Midwife

## 2021-08-17 MED ORDER — NORETHIN ACE-ETH ESTRAD-FE 1-20 MG-MCG PO TABS: 1.0000 | ORAL_TABLET | Freq: Every day | ORAL | 11 refills | Status: DC

## 2021-08-19 ENCOUNTER — Encounter: Payer: Self-pay | Admitting: Family Medicine

## 2021-08-19 ENCOUNTER — Ambulatory Visit (INDEPENDENT_AMBULATORY_CARE_PROVIDER_SITE_OTHER): Payer: Medicaid Other | Admitting: Family Medicine

## 2021-08-19 DIAGNOSIS — M791 Myalgia, unspecified site: Secondary | ICD-10-CM | POA: Diagnosis not present

## 2021-08-19 DIAGNOSIS — J069 Acute upper respiratory infection, unspecified: Secondary | ICD-10-CM | POA: Diagnosis not present

## 2021-08-19 LAB — VERITOR FLU A/B WAIVED
Influenza A: NEGATIVE
Influenza B: NEGATIVE

## 2021-08-19 NOTE — Progress Notes (Signed)
Virtual Visit via telephone Note Due to COVID-19 pandemic this visit was conducted virtually. This visit type was conducted due to national recommendations for restrictions regarding the COVID-19 Pandemic (e.g. social distancing, sheltering in place) in an effort to limit this patient's exposure and mitigate transmission in our community. All issues noted in this document were discussed and addressed.  A physical exam was not performed with this format.   I connected with Linda [name] on 08/19/2021 at 1130 by telephone and verified that I am speaking with the correct person using two identifiers. Linda [name] is currently located at home and patient is currently with them during visit. The provider, Kari Baars, FNP is located in their office at time of visit.  I discussed the limitations, risks, security and privacy concerns of performing an evaluation and management service by virtual visit and the availability of in person appointments. I also discussed with the patient that there may be a patient responsible charge related to this service. The patient expressed understanding and agreed to proceed.  Subjective:  Patient ID: Linda [name], female    DOB: September 18, 2000, 21 y.o.   MRN: [id number]  Chief Complaint:  URI   HPI: Linda [name] is a 21 y.o. female presenting on 08/19/2021 for URI   URI  The current episode started in the past 7 days. The problem has been unchanged. There has been no fever. Associated symptoms include congestion, coughing, headaches, rhinorrhea and a sore throat. Pertinent negatives include no abdominal pain, chest pain, diarrhea, dysuria, ear pain, joint pain, joint swelling, nausea, neck pain, plugged ear sensation, rash, sinus pain, sneezing, swollen glands, vomiting or wheezing. She has tried decongestant and acetaminophen for the symptoms. The treatment provided mild relief.    Relevant past medical, surgical, family, and social  history reviewed and updated as indicated.  Allergies and medications reviewed and updated.   Past Medical History:  Diagnosis Date   Allergy    Anxiety    Asthma    Carpal tunnel syndrome    Depression    Diabetes mellitus without complication (HCC) 2014   Type 1   Tachycardia    Tinnitus     Past Surgical History:  Procedure Laterality Date   ADENOIDECTOMY     TONSILLECTOMY     WISDOM TOOTH EXTRACTION      Social History   Socioeconomic History   Marital status: Media planner    Spouse name: Not on file   Number of children: Not on file   Years of education: Not on file   Highest education level: Not on file  Occupational History   Not on file  Tobacco Use   Smoking status: Never    Passive exposure: Yes   Smokeless tobacco: Never  Vaping Use   Vaping Use: Former  Substance and Sexual Activity   Alcohol use: No   Drug use: No   Sexual activity: Yes    Birth control/protection: Condom  Other Topics Concern   Not on file  Social History Narrative   Not on file   Social Determinants of Health   Financial Resource Strain: Low Risk    Difficulty of Paying Living Expenses: Not hard at all  Food Insecurity: No Food Insecurity   Worried About Programme researcher, broadcasting/film/video in the Last Year: Never true   Ran Out of Food in the Last Year: Never true  Transportation Needs: No Transportation Needs   Lack of Transportation (Medical): No   Lack  of Transportation (Non-Medical): No  Physical Activity: Inactive   Days of Exercise per Week: 0 days   Minutes of Exercise per Session: 0 min  Stress: Stress Concern Present   Feeling of Stress : To some extent  Social Connections: Moderately Isolated   Frequency of Communication with Friends and Family: More than three times a week   Frequency of Social Gatherings with Friends and Family: More than three times a week   Attends Religious Services: Never   Database administrator or Organizations: No   Attends Tax inspector Meetings: Never   Marital Status: Living with partner  Intimate Partner Violence: Not At Risk   Fear of Current or Ex-Partner: No   Emotionally Abused: No   Physically Abused: No   Sexually Abused: No    Outpatient Encounter Medications as of 08/19/2021  Medication Sig   norethindrone-ethinyl estradiol-FE (JUNEL FE 1/20) 1-20 MG-MCG tablet Take 1 tablet by mouth daily.   BD PEN NEEDLE NANO 2ND GEN 32G X 4 MM MISC SMARTSIG:Injection   Continuous Blood Gluc Sensor (DEXCOM G6 SENSOR) MISC SMARTSIG:Topical Every 10 Days   cyclobenzaprine (FLEXERIL) 5 MG tablet Take 1 tablet (5 mg total) by mouth 3 (three) times daily as needed for muscle spasms.   glucagon 1 MG injection Inject 1 mg into the muscle once as needed (for blood sugar).   ibuprofen (ADVIL) 600 MG tablet Take 1 tablet (600 mg total) by mouth every 8 (eight) hours as needed.   insulin degludec (TRESIBA FLEXTOUCH) 100 UNIT/ML FlexTouch Pen Inject 20 Units into the skin daily.   levothyroxine (SYNTHROID) 25 MCG tablet Take 1 tablet (25 mcg total) by mouth daily before breakfast.   MELATONIN PO Take by mouth as needed.   metFORMIN (GLUCOPHAGE) 500 MG tablet Take 500 mg by mouth 2 (two) times daily with a meal.   NIFEdipine (ADALAT CC) 30 MG 24 hr tablet Take 1 tablet (30 mg total) by mouth daily.   norethindrone (ORTHO MICRONOR) 0.35 MG tablet Take 1 tablet (0.35 mg total) by mouth daily.   NOVOLOG FLEXPEN 100 UNIT/ML FlexPen Inject 1 unit for every 10g of carbs with every snack or meal.   ondansetron (ZOFRAN-ODT) 4 MG disintegrating tablet Take 1 tablet (4 mg total) by mouth every 8 (eight) hours as needed for nausea or vomiting.   No facility-administered encounter medications on file as of 08/19/2021.    No Known Allergies  Review of Systems  Constitutional:  Positive for activity change and fatigue. Negative for appetite change, chills, diaphoresis, fever and unexpected weight change.  HENT:  Positive for  congestion, postnasal drip, rhinorrhea and sore throat. Negative for dental problem, drooling, ear discharge, ear pain, facial swelling, hearing loss, mouth sores, nosebleeds, sinus pressure, sinus pain, sneezing, tinnitus, trouble swallowing and voice change.   Respiratory:  Positive for cough. Negative for chest tightness, shortness of breath and wheezing.   Cardiovascular:  Negative for chest pain.  Gastrointestinal:  Negative for abdominal pain, diarrhea, nausea and vomiting.  Genitourinary:  Negative for decreased urine volume, difficulty urinating and dysuria.  Musculoskeletal:  Positive for myalgias. Negative for arthralgias, joint pain and neck pain.  Skin:  Negative for rash.  Neurological:  Positive for headaches. Negative for dizziness, tremors, seizures, syncope, facial asymmetry, speech difficulty, weakness, light-headedness and numbness.  Psychiatric/Behavioral:  Negative for confusion.   All other systems reviewed and are negative.       Observations/Objective: No vital signs or physical exam, this was a  virtual health encounter.  Pt alert and oriented, answers all questions appropriately, and able to speak in full sentences.    Assessment and Plan: Linda [name] was seen today for uri.  Diagnoses and all orders for this visit:  URI with cough and congestion Myalgia No indications of acute bacterial infection. Will test for influenza and COVID. Symptomatic care discussed in detail. Report any new, worsening, or persistent symptoms. Further treatment pending results.  -     Veritor Flu A/B Waived -     Novel Coronavirus, NAA ([facility])     Follow Up Instructions: Return if symptoms worsen or fail to improve.    I discussed the assessment and treatment plan with the patient. The patient was provided an opportunity to ask questions and all were answered. The patient agreed with the plan and demonstrated an understanding of the instructions.   The patient was advised to call  back or seek an in-person evaluation if the symptoms worsen or if the condition fails to improve as anticipated.  The above assessment and management plan was discussed with the patient. The patient verbalized understanding of and has agreed to the management plan. Patient is aware to call the clinic if they develop any new symptoms or if symptoms persist or worsen. Patient is aware when to return to the clinic for a follow-up visit. Patient educated on when it is appropriate to go to the emergency department.    I provided 15 minutes of time during this telepone encounter.   Kari Baars, FNP-C [facility] [address] [place], [place] [address] [phone] 08/19/2021

## 2021-08-20 ENCOUNTER — Telehealth: Payer: Self-pay | Admitting: Family Medicine

## 2021-08-20 ENCOUNTER — Other Ambulatory Visit: Payer: Self-pay | Admitting: Family Medicine

## 2021-08-20 DIAGNOSIS — J069 Acute upper respiratory infection, unspecified: Secondary | ICD-10-CM

## 2021-08-20 LAB — NOVEL CORONAVIRUS, NAA: SARS-CoV-2, NAA: NOT DETECTED

## 2021-08-20 MED ORDER — PSEUDOEPH-BROMPHEN-DM 30-2-10 MG/5ML PO SYRP: 5.0000 mL | ORAL_SOLUTION | Freq: Four times a day (QID) | ORAL | 0 refills | Status: DC | PRN

## 2021-08-20 NOTE — Telephone Encounter (Signed)
Patient aware and verbalizes understanding. 

## 2021-08-20 NOTE — Telephone Encounter (Signed)
Body aches, low grade fever, cough, sore throat, nasal congestion, bilateral ear congestion that has worsened since visit yesterday.

## 2021-08-20 NOTE — Telephone Encounter (Signed)
Aware and verbalizes understanding.  

## 2021-08-20 NOTE — Telephone Encounter (Signed)
Viral illnesses can cause fever for up to 5 days. If symptoms worsen or persist, she may require an antibiotic.

## 2021-08-20 NOTE — Telephone Encounter (Signed)
COVID has not resulted. Will send in Bromfed for help with symptom relief. Tylenol and Motrin as needed for fever and pain control. Flonase daily. Bromfed as prescribed.

## 2021-08-20 NOTE — Telephone Encounter (Signed)
Pt recently had video visit with Rakes for flu.. pt called to let her know that she feels worse now than she did at visit and needs advise/help.   Please advise and call patient ASAP.

## 2021-08-28 DIAGNOSIS — E109 Type 1 diabetes mellitus without complications: Secondary | ICD-10-CM | POA: Diagnosis not present

## 2021-08-30 ENCOUNTER — Inpatient Hospital Stay (HOSPITAL_COMMUNITY)
Admission: AD | Admit: 2021-08-30 | Discharge: 2021-08-30 | Disposition: A | Discharge status: Home / Self Care | Payer: Medicaid Other | Attending: Obstetrics & Gynecology | Admitting: Obstetrics & Gynecology

## 2021-08-30 ENCOUNTER — Other Ambulatory Visit: Payer: Self-pay

## 2021-08-30 DIAGNOSIS — N939 Abnormal uterine and vaginal bleeding, unspecified: Secondary | ICD-10-CM | POA: Insufficient documentation

## 2021-08-30 DIAGNOSIS — N946 Dysmenorrhea, unspecified: Secondary | ICD-10-CM | POA: Insufficient documentation

## 2021-08-30 LAB — POCT PREGNANCY, URINE: Preg Test, Ur: NEGATIVE

## 2021-08-30 NOTE — MAU Note (Signed)
Presents with c/o VB and abdominal cramping that began this morning.  Hasn't taken a HPT.  LMP approx 2 weeks ago.

## 2021-08-30 NOTE — MAU Provider Note (Signed)
S [name] is a 21 y.o. G30P1001 female who presents to MAU today with complaint of VB and menstrual cramps. She is unsure if she is pregnant. Has not taken a pregnancy test. She is using COCs and reports missing some pills. LMP was 2 weeks ago. She gave birth 3 months ago.  ROS: +VB +cramping  O BP 131/89 (BP Location: Right Arm)    Pulse (!) 109    Temp 98.3 F (36.8 C) (Oral)    Resp 19    Ht 5\' 2"  (1.575 m)    Wt 90.6 kg    SpO2 100%    BMI 36.53 kg/m  Physical Exam Constitutional:      General: She is not in acute distress.    Appearance: Normal appearance.  HENT:     Head: Normocephalic and atraumatic.  Pulmonary:     Effort: Pulmonary effort is normal. No respiratory distress.  Musculoskeletal:        General: Normal range of motion.     Cervical back: Normal range of motion.  Neurological:     General: No focal deficit present.     Mental Status: She is alert and oriented to person, place, and time.  Psychiatric:        Mood and Affect: Mood normal.        Behavior: Behavior normal.   Results for orders placed or performed during the hospital encounter of 08/30/21 (from the past 24 hour(s))  Pregnancy, urine POC     Status: None   Collection Time: 08/30/21  2:12 PM  Result Value Ref Range   Preg Test, Ur NEGATIVE NEGATIVE    MDM: No signs of pregnancy or acute process. Offered evaluation in [facility] or UC today or she can try comfort measures at home and f/u at [facility] office. She is undecided at this time. She is stable for discharge.   A 1. Vaginal bleeding   2. Menstrual cramps    P Discharge from MAU in stable condition Warning signs for worsening condition that would warrant emergency follow-up discussed Patient may return to MAU as needed for pregnancy related complaints  Julianne Handler, [place] 08/30/2021 3:09 PM

## 2021-09-01 DIAGNOSIS — Z4681 Encounter for fitting and adjustment of insulin pump: Secondary | ICD-10-CM | POA: Diagnosis not present

## 2021-09-01 DIAGNOSIS — E109 Type 1 diabetes mellitus without complications: Secondary | ICD-10-CM | POA: Diagnosis not present

## 2021-09-02 ENCOUNTER — Ambulatory Visit (INDEPENDENT_AMBULATORY_CARE_PROVIDER_SITE_OTHER): Payer: Medicaid Other | Admitting: Advanced Practice Midwife

## 2021-09-02 ENCOUNTER — Other Ambulatory Visit: Payer: Self-pay

## 2021-09-02 ENCOUNTER — Encounter: Payer: Self-pay | Admitting: Advanced Practice Midwife

## 2021-09-02 VITALS — BP 124/84 | HR 110 | Ht 62.0 in | Wt 204.0 lb

## 2021-09-02 DIAGNOSIS — Z30013 Encounter for initial prescription of injectable contraceptive: Secondary | ICD-10-CM | POA: Diagnosis not present

## 2021-09-02 LAB — POCT URINE PREGNANCY: Preg Test, Ur: NEGATIVE

## 2021-09-02 MED ORDER — MEDROXYPROGESTERONE ACETATE 150 MG/ML IM SUSP: 150.0000 mg | INTRAMUSCULAR | 4 refills | Status: DC

## 2021-09-02 MED ORDER — MEDROXYPROGESTERONE ACETATE 150 MG/ML IM SUSY: PREFILLED_SYRINGE | Freq: Once | INTRAMUSCULAR | Status: AC

## 2021-09-02 NOTE — Progress Notes (Signed)
? ?GYN VISIT ?Patient name: Linda [name] MRN [id number]  Date of birth: 25-Dec-2000 ?Chief Complaint:   ?Contraception ? ?History of Present Illness:   ?Linda [name] is a 21 y.o. G4P1001 Caucasian female being seen today for contraceptive management.  She is 3 months postpartum from a vag del in Nov 2022, and reports frequently forgetting to take her pills. Had cycle from 2/26-2/28 with last sex 2/25 (condoms/withdrawal). ?   ?Patient's last menstrual period was 08/30/2021 (exact date). ?The current method of family planning is OCP (estrogen/progesterone).  ?Last pap Jan 2023. Results were: NILM w/ HRHPV not done (needs next Pap Jan 2024) ? ?Depression screen Linda [name] - [place] 2/9 07/07/2021 04/08/2021 04/01/2021 02/03/2021 12/10/2020  ?Decreased Interest 0 1 0 1 2  ?Down, Depressed, Hopeless 1 1 0 0 0  ?PHQ - 2 Score 1 2 0 1 2  ?Altered sleeping 3 2 0 2 3  ?Tired, decreased energy 3 3 0 3 3  ?Change in appetite 0 0 0 0 1  ?Feeling bad or failure about yourself  1 0 0 0 0  ?Trouble concentrating 2 2 0 1 1  ?Moving slowly or fidgety/restless 0 0 0 0 0  ?Suicidal thoughts 0 0 0 0 0  ?PHQ-9 Score 10 9 0 7 10  ?Difficult doing work/chores Somewhat difficult Somewhat difficult - Somewhat difficult -  ?Some recent data might be hidden  ? ?  ?GAD 7 : Generalized Anxiety Score 07/07/2021 04/08/2021 04/01/2021 02/03/2021  ?Nervous, Anxious, on Edge 2 2 0 1  ?Control/stop worrying 3 2 0 1  ?Worry too much - different things 2 2 0 1  ?Trouble relaxing 1 3 0 2  ?Restless 3 3 0 0  ?Easily annoyed or irritable 2 3 0 3  ?Afraid - awful might happen 1 2 0 1  ?Total GAD 7 Score 14 17 0 9  ?Anxiety Difficulty Somewhat difficult Somewhat difficult - Somewhat difficult  ? ? ? ?Review of Systems:   ?Pertinent items are noted in HPI ?Denies fever/chills, dizziness, headaches, visual disturbances, fatigue, shortness of breath, chest pain, abdominal pain, vomiting, abnormal vaginal discharge/itching/odor/irritation, problems with periods, bowel  movements, urination, or intercourse unless otherwise stated above.  ?Pertinent History Reviewed:  ?Reviewed past medical,surgical, social, obstetrical and family history.  ?Reviewed problem list, medications and allergies. ?Physical Assessment:  ? ?Vitals:  ? 09/02/21 1338  ?BP: 124/84  ?Pulse: (!) 110  ?Weight: 204 lb (92.5 kg)  ?Height: 5\' 2"  (1.575 m)  ?Body mass index is 37.31 kg/m?. ? ?     Physical Examination:  ? General appearance: alert, well appearing, and in [name] distress ? Mental status: alert, oriented to person, place, and time ? Skin: warm & dry  ? Cardiovascular: normal heart rate noted ? Respiratory: normal respiratory effort, [name] distress ? Abdomen: soft, non-tender  ? Pelvic: examination not indicated ? Extremities: [name] edema  ? ?Results for orders placed or performed in visit on 09/02/21 (from the past 24 hour(s))  ?POCT urine pregnancy  ? Collection Time: 09/02/21  2:13 PM  ?Result Value Ref Range  ? Preg Test, Ur Negative Negative  ?  ?Assessment & Plan:  ?1) Contraceptive management> would like to switch from OCPs to something else; all options reviewed and she elects DMPA- will start today; use condoms x 1 week ? ?2) T1DM> insulin and metformin; managed by PCP ? ?Meds:  ?Meds ordered this encounter  ?Medications  ? medroxyPROGESTERone (DEPO-PROVERA) 150 MG/ML injection  ?  Sig: Inject 1  mL (150 mg total) into the muscle every 3 (three) months.  ?  Dispense:  1 mL  ?  Refill:  4  ?  Order Specific Question:   Supervising Provider  ?  Answer:   Duane Lope H [[id number]]  ? medroxyPROGESTERone Acetate SUSY  ? ? ?Orders Placed This Encounter  ?Procedures  ? POCT urine pregnancy  ? ? ?Return in about 3 months (around 12/03/2021) for Depo injection. ? ?[name] CNM ?09/02/2021 ?2:26 PM  ?

## 2021-09-09 ENCOUNTER — Ambulatory Visit: Payer: Medicaid Other | Admitting: Family Medicine

## 2021-09-10 ENCOUNTER — Encounter: Payer: Self-pay | Admitting: Family Medicine

## 2021-09-10 ENCOUNTER — Ambulatory Visit (INDEPENDENT_AMBULATORY_CARE_PROVIDER_SITE_OTHER): Payer: Medicaid Other | Admitting: Family Medicine

## 2021-09-10 VITALS — BP 117/75 | HR 94 | Temp 98.1°F | Resp 20 | Ht 62.0 in | Wt 205.0 lb

## 2021-09-10 DIAGNOSIS — H9203 Otalgia, bilateral: Secondary | ICD-10-CM

## 2021-09-10 DIAGNOSIS — H6593 Unspecified nonsuppurative otitis media, bilateral: Secondary | ICD-10-CM | POA: Diagnosis not present

## 2021-09-10 MED ORDER — FLUTICASONE PROPIONATE 50 MCG/ACT NA SUSP: 2.0000 | Freq: Every day | NASAL | 6 refills | Status: DC

## 2021-09-10 NOTE — Progress Notes (Addendum)
Subjective:  Patient ID: Linda [name], female    DOB: 2000-11-14, 21 y.o.   MRN: [id number]  Patient Care Team: Janora Norlander, DO as PCP - General (Family Medicine)   Chief Complaint:  Ear Pain (R>L )   HPI: Linda [name] is a 21 y.o. female presenting on 09/10/2021 for Ear Pain (R>L )   Pt presents with bilateral ear pain. She had Covid and a cold a month ago. She denies sore throat, ear discharge, sinus pressure, fever, or chills.    Relevant past medical, surgical, family, and social history reviewed and updated as indicated.  Allergies and medications reviewed and updated. Data reviewed: Chart in Epic.   Past Medical History:  Diagnosis Date   Allergy    Anxiety    Asthma    Carpal tunnel syndrome    Depression    Diabetes mellitus without complication ([place]) 123456   Type 1   Tachycardia    Tinnitus     Past Surgical History:  Procedure Laterality Date   ADENOIDECTOMY     TONSILLECTOMY     WISDOM TOOTH EXTRACTION      Social History   Socioeconomic History   Marital status: Soil scientist    Spouse name: Not on file   Number of children: Not on file   Years of education: Not on file   Highest education level: Not on file  Occupational History   Not on file  Tobacco Use   Smoking status: Never    Passive exposure: Yes   Smokeless tobacco: Never  Vaping Use   Vaping Use: Former  Substance and Sexual Activity   Alcohol use: No   Drug use: No   Sexual activity: Yes    Birth control/protection: Condom  Other Topics Concern   Not on file  Social History Narrative   Not on file   Social Determinants of Health   Financial Resource Strain: Low Risk    Difficulty of Paying Living Expenses: Not hard at all  Food Insecurity: No Food Insecurity   Worried About Charity fundraiser in the Last Year: Never true   [place] in the Last Year: Never true  Transportation Needs: No Transportation Needs    Lack of Transportation (Medical): No   Lack of Transportation (Non-Medical): No  Physical Activity: Inactive   Days of Exercise per Week: 0 days   Minutes of Exercise per Session: 0 min  Stress: Stress Concern Present   Feeling of Stress : To some extent  Social Connections: Moderately Isolated   Frequency of Communication with Friends and Family: More than three times a week   Frequency of Social Gatherings with Friends and Family: More than three times a week   Attends Religious Services: Never   Marine scientist or Organizations: No   Attends Archivist Meetings: Never   Marital Status: Living with partner  Intimate Partner Violence: Not At Risk   Fear of Current or Ex-Partner: No   Emotionally Abused: No   Physically Abused: No   Sexually Abused: No    Outpatient Encounter Medications as of 09/10/2021  Medication Sig   Continuous Blood Gluc Sensor (DEXCOM G6 SENSOR) MISC SMARTSIG:Topical Every 10 Days   Continuous Blood Gluc Transmit (DEXCOM G6 TRANSMITTER) MISC CHANGE every three MONTHS as directed with G6 Sensor   fluticasone (FLONASE) 50 MCG/ACT nasal spray Place 2 sprays into both nostrils daily.   insulin aspart (NOVOLOG) 100 UNIT/ML  injection Inject into the skin.   levothyroxine (SYNTHROID) 25 MCG tablet Take 1 tablet (25 mcg total) by mouth daily before breakfast.   medroxyPROGESTERone (DEPO-PROVERA) 150 MG/ML injection Inject 1 mL (150 mg total) into the muscle every 3 (three) months.   MELATONIN PO Take by mouth as needed.   metFORMIN (GLUCOPHAGE-XR) 500 MG 24 hr tablet Take 1,000 mg by mouth every morning.   glucagon 1 MG injection Inject 1 mg into the muscle once as needed (for blood sugar). (Patient not taking: Reported on 09/10/2021)   ibuprofen (ADVIL) 600 MG tablet Take 1 tablet (600 mg total) by mouth every 8 (eight) hours as needed. (Patient not taking: Reported on 09/10/2021)   [DISCONTINUED] BD PEN NEEDLE NANO 2ND GEN 32G  X 4 MM MISC SMARTSIG:Injection   [DISCONTINUED] norethindrone-ethinyl estradiol-FE (JUNEL FE 1/20) 1-20 MG-MCG tablet Take 1 tablet by mouth daily. (Patient not taking: Reported on 09/02/2021)   [DISCONTINUED] NOVOLOG FLEXPEN 100 UNIT/ML FlexPen Inject 1 unit for every 10g of carbs with every snack or meal.   [DISCONTINUED] ondansetron (ZOFRAN-ODT) 4 MG disintegrating tablet Take 1 tablet (4 mg total) by mouth every 8 (eight) hours as needed for nausea or vomiting.   No facility-administered encounter medications on file as of 09/10/2021.    No Known Allergies  Review of Systems  Constitutional:  Negative for activity change, appetite change, chills, diaphoresis, fatigue, fever and unexpected weight change.  HENT:  Positive for ear pain and tinnitus. Negative for congestion, dental problem, drooling, ear discharge, facial swelling, hearing loss, mouth sores, nosebleeds, postnasal drip, rhinorrhea, sinus pressure, sinus pain, sneezing, sore throat, trouble swallowing and voice change.   Respiratory:  Negative for cough and shortness of breath.   Cardiovascular:  Negative for chest pain, palpitations and leg swelling.  Gastrointestinal:  Negative for abdominal pain.  Genitourinary:  Negative for decreased urine volume and difficulty urinating.  Neurological:  Negative for dizziness, weakness and headaches.  Psychiatric/Behavioral:  Negative for confusion.   All other systems reviewed and are negative.      Objective:  BP 117/75    Pulse 94    Temp 98.1 F (36.7 C)    Resp 20    Ht 5\' 2"  (1.575 m)    Wt 93 kg    LMP 08/30/2021 (Approximate)    SpO2 97%    Breastfeeding No    BMI 37.49 kg/m    Wt Readings from Last 3 Encounters:  09/10/21 93 kg  09/02/21 92.5 kg  08/30/21 90.6 kg    Physical Exam Vitals and nursing note reviewed.  Constitutional:      General: She is awake. She is not in acute distress.    Appearance: Normal appearance.  HENT:     Head: Normocephalic and  atraumatic.     Right Ear: Hearing normal. No swelling. A middle ear effusion is present. Tympanic membrane is not perforated, erythematous, retracted or bulging.     Left Ear: Hearing normal. No swelling. A middle ear effusion is present. Tympanic membrane is not perforated, erythematous, retracted or bulging.     Nose: Nose normal. No congestion or rhinorrhea.     Mouth/Throat:     Mouth: Mucous membranes are moist.     Pharynx: Oropharynx is clear. No oropharyngeal exudate or posterior oropharyngeal erythema.  Eyes:     Conjunctiva/sclera: Conjunctivae normal.     Pupils: Pupils are equal, round, and reactive to light.  Cardiovascular:     Rate and Rhythm: Normal rate and  regular rhythm.     Heart sounds: Normal heart sounds.  Pulmonary:     Effort: Pulmonary effort is normal.     Breath sounds: Normal breath sounds.  Skin:    General: Skin is warm and dry.     Capillary Refill: Capillary refill takes less than 2 seconds.  Neurological:     General: No focal deficit present.     Mental Status: She is alert, oriented to person, place, and time and easily aroused.  Psychiatric:        Mood and Affect: Mood normal.        Behavior: Behavior normal. Behavior is cooperative.        Thought Content: Thought content normal.        Judgment: Judgment normal.    Results for orders placed or performed in visit on 09/02/21  POCT urine pregnancy  Result Value Ref Range   Preg Test, Ur Negative Negative       Pertinent labs & imaging results that were available during my care of the patient were reviewed by me and considered in my medical decision making.  Assessment & Plan:  Tamerra was seen today for ear pain.  Diagnoses and all orders for this visit:  Acute ear pain, bilateral Middle ear effusion, bilateral -No concern for acute otitis media on exam and review of history. Bilateral middle ear effusion present. Instructed to begin fluticasone daily.     Continue all other  maintenance medications.  Follow up plan: Return if symptoms worsen or fail to improve.   Continue healthy lifestyle choices, including diet (rich in fruits, vegetables, and lean proteins, and low in salt and simple carbohydrates) and exercise (at least 30 minutes of moderate physical activity daily).    The above assessment and management plan was discussed with the patient. The patient verbalized understanding of and has agreed to the management plan. Patient is aware to call the clinic if they develop any new symptoms or if symptoms persist or worsen. Patient is aware when to return to the clinic for a follow-up visit. Patient educated on when it is appropriate to go to the emergency department.   Addison Francies Inch, NP-S  I personally was present during the history, physical exam, and medical decision-making activities of this visit and have verified that the services and findings are accurately documented in the nurse practitioner student's note.  Monia Pouch, FNP-C [facility] [address] [place], [place] [address] [phone]

## 2021-09-11 ENCOUNTER — Encounter: Payer: Self-pay | Admitting: Family Medicine

## 2021-09-28 DIAGNOSIS — E109 Type 1 diabetes mellitus without complications: Secondary | ICD-10-CM | POA: Diagnosis not present

## 2021-09-29 DIAGNOSIS — E1065 Type 1 diabetes mellitus with hyperglycemia: Secondary | ICD-10-CM | POA: Diagnosis not present

## 2021-10-20 DIAGNOSIS — Z9641 Presence of insulin pump (external) (internal): Secondary | ICD-10-CM | POA: Diagnosis not present

## 2021-10-20 DIAGNOSIS — E079 Disorder of thyroid, unspecified: Secondary | ICD-10-CM | POA: Diagnosis not present

## 2021-10-20 DIAGNOSIS — Z7984 Long term (current) use of oral hypoglycemic drugs: Secondary | ICD-10-CM | POA: Diagnosis not present

## 2021-10-20 DIAGNOSIS — Z79890 Hormone replacement therapy (postmenopausal): Secondary | ICD-10-CM | POA: Diagnosis not present

## 2021-10-20 DIAGNOSIS — Z79899 Other long term (current) drug therapy: Secondary | ICD-10-CM | POA: Diagnosis not present

## 2021-10-20 DIAGNOSIS — Z6838 Body mass index (BMI) 38.0-38.9, adult: Secondary | ICD-10-CM | POA: Diagnosis not present

## 2021-10-20 DIAGNOSIS — I10 Essential (primary) hypertension: Secondary | ICD-10-CM | POA: Diagnosis not present

## 2021-10-20 DIAGNOSIS — E785 Hyperlipidemia, unspecified: Secondary | ICD-10-CM | POA: Diagnosis not present

## 2021-10-20 DIAGNOSIS — E1065 Type 1 diabetes mellitus with hyperglycemia: Secondary | ICD-10-CM | POA: Diagnosis not present

## 2021-10-26 DIAGNOSIS — E1065 Type 1 diabetes mellitus with hyperglycemia: Secondary | ICD-10-CM | POA: Diagnosis not present

## 2021-10-26 DIAGNOSIS — Z7182 Exercise counseling: Secondary | ICD-10-CM | POA: Diagnosis not present

## 2021-10-26 DIAGNOSIS — O24011 Pre-existing diabetes mellitus, type 1, in pregnancy, first trimester: Secondary | ICD-10-CM | POA: Diagnosis not present

## 2021-10-26 DIAGNOSIS — R635 Abnormal weight gain: Secondary | ICD-10-CM | POA: Diagnosis not present

## 2021-10-26 DIAGNOSIS — Z6837 Body mass index (BMI) 37.0-37.9, adult: Secondary | ICD-10-CM | POA: Diagnosis not present

## 2021-10-27 ENCOUNTER — Ambulatory Visit: Payer: Medicaid Other | Admitting: Nurse Practitioner

## 2021-10-27 ENCOUNTER — Telehealth: Payer: Self-pay

## 2021-10-27 ENCOUNTER — Encounter: Payer: Self-pay | Admitting: Nurse Practitioner

## 2021-10-27 VITALS — BP 122/80 | HR 89 | Temp 97.5°F | Resp 20 | Ht 62.0 in | Wt 205.0 lb

## 2021-10-27 DIAGNOSIS — E1065 Type 1 diabetes mellitus with hyperglycemia: Secondary | ICD-10-CM | POA: Diagnosis not present

## 2021-10-27 NOTE — Telephone Encounter (Signed)
Needs appt. Please scheudle her with same day so she can be evaluated ASAP. She will need blood labs, urine and vitals ? ?

## 2021-10-27 NOTE — Progress Notes (Signed)
? ?  Subjective:  ? ? Patient ID: Linda [name], female    DOB: [birth year], 21 y.o.   MRN: [id number] ? ?Chief Complaint: Hyperglycemia ? ? ?Hyperglycemia ?Pertinent negatives include no abdominal pain, chest pain, diaphoresis, headaches, rash or weakness.  ? ?Patient says that her blood sugar was over 300 last night. She is on novalog sliding scale. This morning her blood sugar was 301. She is on novalog pump and it gave her extra insulin. It took over an hour to get it down to 180. She has dexcom and her current reading is 166. She see Laural Benes , endocrinology. Her last hgba1c was 7.6%. ? ? ? ?Review of Systems  ?Constitutional:  Negative for diaphoresis.  ?Eyes:  Negative for pain.  ?Respiratory:  Negative for shortness of breath.   ?Cardiovascular:  Negative for chest pain, palpitations and leg swelling.  ?Gastrointestinal:  Negative for abdominal pain.  ?Endocrine: Negative for polydipsia.  ?Skin:  Negative for rash.  ?Neurological:  Negative for dizziness, weakness and headaches.  ?Hematological:  Does not bruise/bleed easily.  ?All other systems reviewed and are negative. ? ?   ?Objective:  ? Physical Exam ?Cardiovascular:  ?   Rate and Rhythm: Normal rate.  ?Pulmonary:  ?   Effort: Pulmonary effort is normal.  ?Skin: ?   General: Skin is warm.  ?Neurological:  ?   General: No focal deficit present.  ?   Mental Status: She is oriented to person, place, and time.  ?Psychiatric:     ?   Mood and Affect: Mood normal.     ?   Behavior: Behavior normal.  ? ?BP 122/80   Pulse 89   Temp (!) 97.5 ?F (36.4 ?C) (Temporal)   Resp 20   Ht 5\' 2"  (1.575 m)   Wt 205 lb (93 kg)   SpO2 98%   BMI 37.49 kg/m?  ? ?Current blood sugar 180 ? ? ?   ?Assessment & Plan:  ? ?Linda [name] in today with chief complaint of Hyperglycemia ? ? ?1. Type 1 diabetes mellitus with hyperglycemia (HCC) ?Watch diet ?Do not bolus yourself until you sit down to eat ?Orange juice with sugar when blood sugar is low. ? ? ? ?The  above assessment and management plan was discussed with the patient. The patient verbalized understanding of and has agreed to the management plan. Patient is aware to call the clinic if symptoms persist or worsen. Patient is aware when to return to the clinic for a follow-up visit. Patient educated on when it is appropriate to go to the emergency department.  ? ?Mary-Margaret , FNP ? ? ?

## 2021-10-27 NOTE — Telephone Encounter (Signed)
Woke at 5am this morning with 350 blood sugar. Pt is Type I Diabetic with an insulin pump. She had chills this am ( she describes a tingling sensation all over body). Temp is . At time of phone call to triage blood sugar is 202. Pts main complaint is body/muscle pain ( like cramping per pt). She states that normally her ketones are elevated when this happens. Pt does not have any test strips at home and would like to be checked. Has infant daughter. She is not breast feeding. ? ?Will send message to Dr. Nadine Counts to verify if pt only needs a urine or appt?? ?

## 2021-10-27 NOTE — Telephone Encounter (Signed)
Pt has been scheduled.  °

## 2021-10-27 NOTE — Patient Instructions (Signed)
Hyperglycemia Hyperglycemia is when the sugar (glucose) level in your blood is too high. High blood sugar can happen to people who have or do not have diabetes. High blood sugar can happen quickly. It can be an emergency. What are the causes? If you have diabetes, high blood sugar may be caused by: Medicines that increase blood sugar or affect your control of diabetes. Getting less physical activity. Overeating. Being sick or injured or having an infection. Having surgery. Stress. Not giving yourself enough insulin (if you are taking it). You may have high blood sugar because you have diabetes that has not been diagnosed yet. If you do not have diabetes, high blood sugar may be caused by: Certain medicines. Stress. A bad illness. An infection. Having surgery. Diseases of the pancreas. What increases the risk? This condition is more likely to develop in people who have risk factors for diabetes, such as: Having a family member with diabetes. Certain conditions in which the body's defense system (immune system) attacks itself. These are called autoimmune disorders. Being overweight. Not being active. Having a condition called insulin resistance. Having a history of: Prediabetes. Diabetes when pregnant. Polycystic ovarian syndrome (PCOS). What are the signs or symptoms? This condition may not cause symptoms. If you do have symptoms, they may include: Feeling more thirsty than normal. Needing to pee (urinate) more often than normal. Hunger. Feeling very tired. Blurry eyesight (vision). You may get other symptoms as the condition gets worse, such as: Dry mouth. Pain in your belly (abdomen). Not being hungry (loss of appetite). Breath that smells fruity. Weakness. Weight loss that is not planned. A tingling or numb feeling in your hands or feet. A headache. Cuts or bruises that heal slowly. How is this treated? Treatment depends on the cause of your condition. Treatment may  include: Taking medicine to control your blood sugar levels. Changing your medicine or dosage if you take insulin or other diabetes medicines. Lifestyle changes. These may include: Exercising more. Eating healthier foods. Losing weight. Treating an illness or infection. Checking your blood sugar more often. Stopping or reducing steroid medicines. If your condition gets very bad, you will need to be treated in the hospital. Follow these instructions at home: General instructions Take over-the-counter and prescription medicines only as told by your doctor. Do not smoke or use any products that contain nicotine or tobacco. If you need help quitting, ask your doctor. If you drink alcohol: Limit how much you have to: 0-1 drink a day for women who are not pregnant. 0-2 drinks a day for men. Know how much alcohol is in a drink. In the [place] one drink equals one 12 oz bottle of beer (355 mL), one 5 oz glass of wine (148 mL), or one 1 oz glass of hard liquor (44 mL). Manage stress. If you need help with this, ask your doctor. Do exercises as told by your doctor. Keep all follow-up visits. Eating and drinking  Stay at a healthy weight. Make sure you drink enough fluid when you: Exercise. Get sick. Are in hot temperatures. Drink enough fluid to keep your pee (urine) pale yellow. If you have diabetes:  Know the symptoms of high blood sugar. Follow your diabetes management plan as told by your doctor. Make sure you: Take insulin and medicines as told. Follow your exercise plan. Follow your meal plan. Eat on time. Do not skip meals. Check your blood sugar as often as told. Make sure you check before and after exercise. If you   exercise longer or in a different way, check your blood sugar more often. Follow your sick day plan whenever you cannot eat or drink normally. Make this plan ahead of time with your doctor. Share your diabetes management plan with people in your workplace, school,  and household. Check your pee for ketones when you are ill and as told by your doctor. Carry a card or wear jewelry that says that you have diabetes. Where to find more information American Diabetes Association: www.diabetes.org Contact a doctor if: Your blood sugar level is at or above 240 mg/dL (13.3 mmol/L) for 2 days in a row. You have problems keeping your blood sugar in your target range. You have high blood pressure often. You have signs of illness, such as: Feeling like you may vomit (feeling nauseous). Vomiting. A fever. Get help right away if: Your blood sugar monitor reads "high" even when you are taking insulin. You have trouble breathing. You have a change in how you think, feel, or act (mental status). You feel like you may vomit, and the feeling does not go away. You cannot stop vomiting. These symptoms may be an emergency. Get medical help right away. Call your local emergency services (911 in the [place] Do not wait to see if the symptoms will go away. Do not drive yourself to the hospital. Summary Hyperglycemia is when the sugar (glucose) level in your blood is too high. High blood sugar can happen to people who have or do not have diabetes. Make sure you drink enough fluids and follow your meal plan. Exercise as often as told by your doctor. Contact your doctor if you have problems keeping your blood sugar in your target range. This information is not intended to replace advice given to you by your health care provider. Make sure you discuss any questions you have with your health care provider. Document Revised: 04/04/2020 Document Reviewed: 04/04/2020 Elsevier Patient Education  2023 [employer]  

## 2021-11-03 ENCOUNTER — Encounter: Payer: Self-pay | Admitting: Nurse Practitioner

## 2021-11-03 ENCOUNTER — Ambulatory Visit (INDEPENDENT_AMBULATORY_CARE_PROVIDER_SITE_OTHER): Payer: Medicaid Other | Admitting: Nurse Practitioner

## 2021-11-03 DIAGNOSIS — R300 Dysuria: Secondary | ICD-10-CM

## 2021-11-03 NOTE — Progress Notes (Signed)
? ?  Virtual Visit  Note ?Due to COVID-19 pandemic this visit was conducted virtually. This visit type was conducted due to national recommendations for restrictions regarding the COVID-19 Pandemic (e.g. social distancing, sheltering in place) in an effort to limit this patient's exposure and mitigate transmission in our community. All issues noted in this document were discussed and addressed.  A physical exam was not performed with this format. ? ?I connected with Linda [name] on 11/03/21 at 4:40 am by telephone and verified that I am speaking with the correct person using two identifiers. Linda [name] is currently located at home during visit. The provider, Daryll Drown, NP is located in their office at time of visit. ? ?I discussed the limitations, risks, security and privacy concerns of performing an evaluation and management service by telephone and the availability of in person appointments. I also discussed with the patient that there may be a patient responsible charge related to this service. The patient expressed understanding and agreed to proceed. ? ? ?History and Present Illness: ? ?Dysuria  ?This is a new problem. The current episode started yesterday. The problem has been gradually worsening. There has been no fever. She is Sexually active. There is No history of pyelonephritis. Associated symptoms include flank pain, frequency and urgency. Pertinent negatives include no chills or hematuria. She has tried nothing for the symptoms. The treatment provided no relief.  ? ? ? ?Review of Systems  ?Constitutional:  Negative for chills.  ?HENT: Negative.    ?Cardiovascular: Negative.   ?Genitourinary:  Positive for dysuria, flank pain, frequency and urgency. Negative for hematuria.  ?Skin: Negative.  Negative for rash.  ?All other systems reviewed and are negative. ? ? ?Observations/Objective: ?Televisit-Patient  ? ?Assessment and Plan: ? ?UTI  vs  interstitial cystitis ?-urinalysis  completed results pending ?-pyridium for pain ?Precaution and education provided ?-All questions answered ?-follow up with unresolved symptoms  ? ?Follow Up Instructions: ?Follow-up for worsening hours of symptoms. ? ?  ?I discussed the assessment and treatment plan with the patient. The patient was provided an opportunity to ask questions and all were answered. The patient agreed with the plan and demonstrated an understanding of the instructions. ?  ?The patient was advised to call back or seek an in-person evaluation if the symptoms worsen or if the condition fails to improve as anticipated. ? ?The above assessment and management plan was discussed with the patient. The patient verbalized understanding of and has agreed to the management plan. Patient is aware to call the clinic if symptoms persist or worsen. Patient is aware when to return to the clinic for a follow-up visit. Patient educated on when it is appropriate to go to the emergency department.  ? ?Time call ended: 4:50 PM ?I provided 10 minutes of  non face-to-face time during this encounter. ? ? ? ?Daryll Drown, NP ?  ?

## 2021-11-04 ENCOUNTER — Other Ambulatory Visit: Payer: Self-pay | Admitting: Nurse Practitioner

## 2021-11-04 ENCOUNTER — Other Ambulatory Visit: Payer: Medicaid Other

## 2021-11-04 DIAGNOSIS — R300 Dysuria: Secondary | ICD-10-CM | POA: Diagnosis not present

## 2021-11-04 DIAGNOSIS — R8290 Unspecified abnormal findings in urine: Secondary | ICD-10-CM

## 2021-11-04 LAB — MICROSCOPIC EXAMINATION
RBC, Urine: NONE SEEN /hpf (ref 0–2)
Renal Epithel, UA: NONE SEEN /hpf

## 2021-11-04 LAB — URINALYSIS, ROUTINE W REFLEX MICROSCOPIC
Bilirubin, UA: NEGATIVE
Glucose, UA: NEGATIVE
Ketones, UA: NEGATIVE
Leukocytes,UA: NEGATIVE
Nitrite, UA: NEGATIVE
RBC, UA: NEGATIVE
Specific Gravity, UA: 1.025 (ref 1.005–1.030)
Urobilinogen, Ur: 0.2 mg/dL (ref 0.2–1.0)
pH, UA: 6 (ref 5.0–7.5)

## 2021-11-04 MED ORDER — NITROFURANTOIN MONOHYD MACRO 100 MG PO CAPS: 100.0000 mg | ORAL_CAPSULE | Freq: Two times a day (BID) | ORAL | 0 refills | Status: DC

## 2021-11-07 LAB — CULTURE, URINE COMPREHENSIVE

## 2021-11-13 DIAGNOSIS — E109 Type 1 diabetes mellitus without complications: Secondary | ICD-10-CM | POA: Diagnosis not present

## 2021-11-25 ENCOUNTER — Ambulatory Visit (INDEPENDENT_AMBULATORY_CARE_PROVIDER_SITE_OTHER): Payer: Medicaid Other | Admitting: *Deleted

## 2021-11-25 DIAGNOSIS — Z3042 Encounter for surveillance of injectable contraceptive: Secondary | ICD-10-CM

## 2021-11-25 MED ORDER — MEDROXYPROGESTERONE ACETATE 150 MG/ML IM SUSP
150.0000 mg | Freq: Once | INTRAMUSCULAR | Status: AC
  Administered 2021-11-25: 150 mg via INTRAMUSCULAR

## 2021-11-25 NOTE — Progress Notes (Signed)
   NURSE VISIT- INJECTION  SUBJECTIVE:  Linda [name] is a 21 y.o. G31P1001 female here for a Depo Provera for contraception/period management. She is a GYN patient.   OBJECTIVE:  There were no vitals taken for this visit.  Appears well, in no apparent distress  Injection administered in: Left upper quad. gluteus  Meds ordered this encounter  Medications   medroxyPROGESTERone (DEPO-PROVERA) injection 150 mg    ASSESSMENT: GYN patient Depo Provera for contraception/period management PLAN: Follow-up: in 11-13 weeks for next Depo   [name]  11/25/2021 2:19 PM

## 2021-12-01 ENCOUNTER — Encounter: Payer: Self-pay | Admitting: Family Medicine

## 2021-12-01 ENCOUNTER — Telehealth (INDEPENDENT_AMBULATORY_CARE_PROVIDER_SITE_OTHER): Payer: Medicaid Other | Admitting: Family Medicine

## 2021-12-01 DIAGNOSIS — T24219A Burn of second degree of unspecified thigh, initial encounter: Secondary | ICD-10-CM

## 2021-12-01 DIAGNOSIS — T24211A Burn of second degree of right thigh, initial encounter: Secondary | ICD-10-CM

## 2021-12-01 MED ORDER — SILVER SULFADIAZINE 1 % EX CREA: 1.0000 "application " | TOPICAL_CREAM | Freq: Every day | CUTANEOUS | 0 refills | Status: DC

## 2021-12-01 MED ORDER — AMOXICILLIN-POT CLAVULANATE 875-125 MG PO TABS: 1.0000 | ORAL_TABLET | Freq: Two times a day (BID) | ORAL | 0 refills | Status: DC
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
## 2022-01-07 DIAGNOSIS — E1065 Type 1 diabetes mellitus with hyperglycemia: Secondary | ICD-10-CM | POA: Diagnosis not present

## 2022-01-11 ENCOUNTER — Other Ambulatory Visit: Payer: Self-pay | Admitting: Women's Health

## 2022-01-20 ENCOUNTER — Ambulatory Visit (INDEPENDENT_AMBULATORY_CARE_PROVIDER_SITE_OTHER): Payer: Medicaid Other | Admitting: Family Medicine

## 2022-01-20 ENCOUNTER — Encounter: Payer: Self-pay | Admitting: Family Medicine

## 2022-01-20 ENCOUNTER — Other Ambulatory Visit: Payer: Self-pay

## 2022-01-20 VITALS — BP 135/90 | HR 116 | Temp 97.2°F | Ht 62.0 in | Wt 208.6 lb

## 2022-01-20 DIAGNOSIS — R5383 Other fatigue: Secondary | ICD-10-CM

## 2022-01-20 DIAGNOSIS — B3731 Acute candidiasis of vulva and vagina: Secondary | ICD-10-CM | POA: Diagnosis not present

## 2022-01-20 DIAGNOSIS — F32A Depression, unspecified: Secondary | ICD-10-CM | POA: Diagnosis not present

## 2022-01-20 DIAGNOSIS — E039 Hypothyroidism, unspecified: Secondary | ICD-10-CM | POA: Diagnosis not present

## 2022-01-20 DIAGNOSIS — R109 Unspecified abdominal pain: Secondary | ICD-10-CM | POA: Diagnosis not present

## 2022-01-20 DIAGNOSIS — E1065 Type 1 diabetes mellitus with hyperglycemia: Secondary | ICD-10-CM | POA: Diagnosis not present

## 2022-01-20 DIAGNOSIS — F419 Anxiety disorder, unspecified: Secondary | ICD-10-CM | POA: Diagnosis not present

## 2022-01-20 DIAGNOSIS — N390 Urinary tract infection, site not specified: Secondary | ICD-10-CM | POA: Diagnosis not present

## 2022-01-20 LAB — MICROSCOPIC EXAMINATION
RBC, Urine: NONE SEEN /hpf (ref 0–2)
Renal Epithel, UA: NONE SEEN /hpf

## 2022-01-20 LAB — URINALYSIS, ROUTINE W REFLEX MICROSCOPIC
Bilirubin, UA: NEGATIVE
Glucose, UA: NEGATIVE
Ketones, UA: NEGATIVE
Leukocytes,UA: NEGATIVE
Nitrite, UA: NEGATIVE
Protein,UA: NEGATIVE
Specific Gravity, UA: 1.02 (ref 1.005–1.030)
Urobilinogen, Ur: 0.2 mg/dL (ref 0.2–1.0)
pH, UA: 7 (ref 5.0–7.5)

## 2022-01-20 LAB — BAYER DCA HB A1C WAIVED: HB A1C (BAYER DCA - WAIVED): 7.9 % — ABNORMAL HIGH (ref 4.8–5.6)

## 2022-01-20 MED ORDER — DESVENLAFAXINE SUCCINATE ER 50 MG PO TB24
50.0000 mg | ORAL_TABLET | Freq: Every day | ORAL | 0 refills | Status: DC
Start: 2022-01-20 — End: 2022-10-06

## 2022-01-20 MED ORDER — BUSPIRONE HCL 15 MG PO TABS: 15.0000 mg | ORAL_TABLET | Freq: Two times a day (BID) | ORAL | 0 refills | Status: DC

## 2022-01-20 MED ORDER — FLUCONAZOLE 150 MG PO TABS: 150.0000 mg | ORAL_TABLET | Freq: Once | ORAL | 0 refills | Status: AC

## 2022-01-20 NOTE — Progress Notes (Signed)
Subjective: CC:?  UTI PCP: Janora Norlander, DO IDP:[id number] Linda [name] is a 21 y.o. female presenting to clinic today for:  1.  Flank pain Patient reports that she has been having some intermittent back pain and she was not sure if these were kidney related.  She reports some pressure.  No dysuria, hematuria, change in urinary frequency.  No nausea vomiting or fevers reported.  She notes that over the last 7 months she has been having issues where she forgets where she is at or what she is doing.  She feels fatigued all the time.  Of note she delivered her child 7 months ago.  She has been off of all of her medications except for her insulin since 7 months ago because she has been lost to follow-up with her endocrinologist.  In summary, she was treated with Synthroid, insulin.  She was previously on Pristiq and BuSpar prior to pregnancy.  She is been off of these as well.  She does report quite a bit of depression and anxiety that have been present and maybe even a little worse since delivering her child.  She is emotionally eating and has subsequently gained weight.  She wants a referral to both therapy and psychiatrist.   ROS: Per HPI  No Known Allergies Past Medical History:  Diagnosis Date   Allergy    Anxiety    Asthma    Carpal tunnel syndrome    Depression    Diabetes mellitus without complication ([place]) 3614   Type 1   Tachycardia    Tinnitus     Current Outpatient Medications:    Continuous Blood Gluc Sensor (DEXCOM G6 SENSOR) MISC, SMARTSIG:Topical Every 10 Days, Disp: , Rfl:    Continuous Blood Gluc Transmit (DEXCOM G6 TRANSMITTER) MISC, CHANGE every three MONTHS as directed with G6 Sensor, Disp: , Rfl:    fluticasone (FLONASE) 50 MCG/ACT nasal spray, Place 2 sprays into both nostrils daily., Disp: 16 g, Rfl: 6   glucagon 1 MG injection, Inject 1 mg into the muscle once as needed (for blood sugar)., Disp: , Rfl:    ibuprofen (ADVIL) 600 MG tablet, Take 1 tablet (600  mg total) by mouth every 8 (eight) hours as needed., Disp: 30 tablet, Rfl: 0   insulin aspart (NOVOLOG) 100 UNIT/ML injection, Inject into the skin., Disp: , Rfl:    levothyroxine (SYNTHROID) 25 MCG tablet, Take 1 tablet (25 mcg total) by mouth daily before breakfast., Disp: 90 tablet, Rfl: 3   MELATONIN PO, Take by mouth as needed., Disp: , Rfl:    metFORMIN (GLUCOPHAGE-XR) 500 MG 24 hr tablet, Take 1,000 mg by mouth every morning., Disp: , Rfl:    silver sulfADIAZINE (SILVADENE) 1 % cream, Apply 1 application. topically daily., Disp: 50 g, Rfl: 0 Social History   Socioeconomic History   Marital status: Soil scientist    Spouse name: Not on file   Number of children: Not on file   Years of education: Not on file   Highest education level: Not on file  Occupational History   Not on file  Tobacco Use   Smoking status: Never    Passive exposure: Yes   Smokeless tobacco: Never  Vaping Use   Vaping Use: Former  Substance and Sexual Activity   Alcohol use: No   Drug use: No   Sexual activity: Yes    Birth control/protection: Condom, Injection  Other Topics Concern   Not on file  Social History Narrative   Not  on file   Social Determinants of Health   Financial Resource Strain: Low Risk  (07/17/2021)   Overall Financial Resource Strain (CARDIA)    Difficulty of Paying Living Expenses: Not hard at all  Food Insecurity: No Food Insecurity (07/17/2021)   Hunger Vital Sign    Worried About Running Out of Food in the Last Year: Never true    Ran Out of Food in the Last Year: Never true  Transportation Needs: No Transportation Needs (07/17/2021)   PRAPARE - Hydrologist (Medical): No    Lack of Transportation (Non-Medical): No  Physical Activity: Inactive (07/17/2021)   Exercise Vital Sign    Days of Exercise per Week: 0 days    Minutes of Exercise per Session: 0 min  Stress: Stress Concern Present (07/17/2021)   [place]    Feeling of Stress : To some extent  Social Connections: Moderately Isolated (07/17/2021)   Social Connection and Isolation Panel [NHANES]    Frequency of Communication with Friends and Family: More than three times a week    Frequency of Social Gatherings with Friends and Family: More than three times a week    Attends Religious Services: Never    Marine scientist or Organizations: No    Attends Archivist Meetings: Never    Marital Status: Living with partner  Intimate Partner Violence: Not At Risk (07/17/2021)   Humiliation, Afraid, Rape, and Kick questionnaire    Fear of Current or Ex-Partner: No    Emotionally Abused: No    Physically Abused: No    Sexually Abused: No   Family History  Problem Relation Age of Onset   Hypertension Mother    Thyroid disease Mother    Asthma Brother    Thyroid disease Brother    COPD Paternal Grandmother    Hypertension Paternal Grandmother    COPD Maternal Grandmother    Lumbar disc disease Maternal Grandmother    Dementia Maternal Grandmother    Heart attack Maternal Grandfather    Emphysema Maternal Grandfather    Ankylosing spondylitis Maternal Grandfather    Dementia Maternal Grandfather     Objective: Office vital signs reviewed. BP 135/90   Pulse (!) 116   Temp (!) 97.2 F (36.2 C)   Ht _0  (1.575 m)   Wt 208 lb 9.6 oz (94.6 kg)   SpO2 95%   BMI 38.15 kg/m   Physical Examination:  General: Awake, alert, morbidly obese female, No acute distress HEENT: No except thalmus.  No goiter Cardio: regular rate and rhythm, S1S2 heard, no murmurs appreciated Pulm: clear to auscultation bilaterally, no wheezes, rhonchi or rales; normal work of breathing on room air Skin: Normal temperature Neuro: No tremor Psych: Mood stable, speech normal.  Good eye contact     01/20/2022    2:40 PM 10/27/2021    2:52 PM 09/10/2021    1:50 PM  Depression screen PHQ 2/9  Decreased Interest _1 Down, Depressed, Hopeless _2 PHQ - 2 Score _3 Altered sleeping _4 Tired, decreased energy _5 Change in appetite _6 Feeling bad or failure about yourself  2 2 0  Trouble concentrating _7 Moving slowly or fidgety/restless 0 0 1  Suicidal thoughts 0 0 0  PHQ-9 Score _8 Difficult doing work/chores Extremely dIfficult Extremely  dIfficult Somewhat difficult      01/20/2022    2:40 PM 10/27/2021    2:52 PM 09/10/2021    1:50 PM 07/07/2021    2:27 PM  GAD 7 : Generalized Anxiety Score  Nervous, Anxious, on Edge 3 3 0 2  Control/stop worrying _0 Worry too much - different things _1 Trouble relaxing _2 Restless _3 Easily annoyed or irritable _4 Afraid - awful might happen _5 Total GAD 7 Score _6 Anxiety Difficulty Extremely difficult Extremely difficult Somewhat difficult Somewhat difficult    Assessment/ Plan: 21 y.o. female   Anxiety and depression - Plan: desvenlafaxine (PRISTIQ) 50 MG 24 hr tablet, busPIRone (BUSPAR) 15 MG tablet, Ambulatory referral to Psychiatry, Ambulatory referral to Psychology  Flank pain - Plan: Urinalysis, Routine w reflex microscopic, Urine Culture  Type 1 diabetes mellitus with hyperglycemia (HCC) - Plan: Bayer DCA Hb A1c Waived  Hypothyroidism, unspecified type - Plan: CMP14+EGFR, TSH, T4, Free  Other fatigue - Plan: Vitamin B12, CMP14+EGFR, CBC  Yeast vaginitis - Plan: fluconazole (DIFLUCAN) 150 MG tablet  Suspect much of her symptomology is likely surrounding uncontrolled anxiety and depression.  I have renewed her Pristiq and we will go ahead and advance to 50 mg daily, BuSpar 15 mg twice daily since this is the dose that she was requiring prior to pregnancy.  Referral to psychiatry and psychology per her request.  Both of these should be available in [facility] and she is aware that the psychiatrist may not be available to her to the end of August.  She was willing to  wait.  Urinalysis showed some bacteria but no leukocytes or nitrites on going to send this for culture.  She had no significant findings on exam.  Her urine did show some yeast so Diflucan also sent  Has not had her A1c checked in a while and next appointment with endocrinology is not for a couple of months.  We will check this along with her thyroid levels.  We will plan to reinitiate thyroid medication pending these results  Check vitamin B12, CMP and CBC given fatigability but again I think this may be related to anxiety and depression.   Orders Placed This Encounter  Procedures   Urine Culture   Urinalysis, Routine w reflex microscopic   Vitamin B12   CMP14+EGFR   CBC   TSH   T4, Free   Bayer DCA Hb A1c Waived   Ambulatory referral to Psychiatry    Referral Priority:   Routine    Referral Type:   Psychiatric    Referral Reason:   Specialty Services Required    Requested Specialty:   Psychiatry    Number of Visits Requested:   1   Ambulatory referral to Psychology    Referral Priority:   Routine    Referral Type:   Psychiatric    Referral Reason:   Specialty Services Required    Requested Specialty:   Psychology    Number of Visits Requested:   1   No orders of the defined types were placed in this encounter.    Janora Norlander, DO [place] [phone]

## 2022-01-21 LAB — CBC
Hematocrit: 40.6 % (ref 34.0–46.6)
Hemoglobin: 13.4 g/dL (ref 11.1–15.9)
MCH: 28.8 pg (ref 26.6–33.0)
MCHC: 33 g/dL (ref 31.5–35.7)
MCV: 87 fL (ref 79–97)
Platelets: 333 10*3/uL (ref 150–450)
RBC: 4.66 x10E6/uL (ref 3.77–5.28)
RDW: 13.5 % (ref 11.7–15.4)
WBC: 6.8 10*3/uL (ref 3.4–10.8)

## 2022-01-21 LAB — CMP14+EGFR
ALT: 11 IU/L (ref 0–32)
AST: 14 IU/L (ref 0–40)
Albumin/Globulin Ratio: 1.6 (ref 1.2–2.2)
Albumin: 4.4 g/dL (ref 4.0–5.0)
Alkaline Phosphatase: 70 IU/L (ref 44–121)
BUN/Creatinine Ratio: 9 (ref 9–23)
BUN: 8 mg/dL (ref 6–20)
Bilirubin Total: 0.3 mg/dL (ref 0.0–1.2)
CO2: 21 mmol/L (ref 20–29)
Calcium: 9.4 mg/dL (ref 8.7–10.2)
Chloride: 106 mmol/L (ref 96–106)
Creatinine, Ser: 0.87 mg/dL (ref 0.57–1.00)
Globulin, Total: 2.7 g/dL (ref 1.5–4.5)
Glucose: 83 mg/dL (ref 70–99)
Potassium: 4.1 mmol/L (ref 3.5–5.2)
Sodium: 142 mmol/L (ref 134–144)
Total Protein: 7.1 g/dL (ref 6.0–8.5)
eGFR: 97 mL/min/{1.73_m2} (ref >59)

## 2022-01-21 LAB — T4, FREE: Free T4: 1.62 ng/dL (ref 0.82–1.77)

## 2022-01-21 LAB — VITAMIN B12: Vitamin B-12: 261 pg/mL (ref 232–1245)

## 2022-01-21 LAB — TSH: TSH: 3.96 u[IU]/mL (ref 0.450–4.500)

## 2022-01-22 ENCOUNTER — Encounter: Payer: Self-pay | Admitting: Family Medicine

## 2022-01-22 LAB — URINE CULTURE

## 2022-02-10 ENCOUNTER — Ambulatory Visit (INDEPENDENT_AMBULATORY_CARE_PROVIDER_SITE_OTHER): Payer: Medicaid Other | Admitting: *Deleted

## 2022-02-10 DIAGNOSIS — Z3042 Encounter for surveillance of injectable contraceptive: Secondary | ICD-10-CM | POA: Diagnosis not present

## 2022-02-10 MED ORDER — MEDROXYPROGESTERONE ACETATE 150 MG/ML IM SUSP
150.0000 mg | Freq: Once | INTRAMUSCULAR | Status: AC
  Administered 2022-02-10: 150 mg via INTRAMUSCULAR

## 2022-02-10 NOTE — Progress Notes (Signed)
   NURSE VISIT- INJECTION  SUBJECTIVE:  Linda [name] is a 21 y.o. G38P1001 female here for a Depo Provera for contraception/period management. She is a GYN patient.   OBJECTIVE:  There were no vitals taken for this visit.  Appears well, in no apparent distress  Injection administered in: Right deltoid  Meds ordered this encounter  Medications   medroxyPROGESTERone (DEPO-PROVERA) injection 150 mg    ASSESSMENT: GYN patient Depo Provera for contraception/period management PLAN: Follow-up: in 11-13 weeks for next Depo   [name]  02/10/2022 2:28 PM

## 2022-03-12 DIAGNOSIS — E860 Dehydration: Secondary | ICD-10-CM | POA: Diagnosis not present

## 2022-03-12 DIAGNOSIS — R739 Hyperglycemia, unspecified: Secondary | ICD-10-CM | POA: Diagnosis not present

## 2022-03-12 DIAGNOSIS — Z720 Tobacco use: Secondary | ICD-10-CM | POA: Diagnosis not present

## 2022-03-12 DIAGNOSIS — Z9641 Presence of insulin pump (external) (internal): Secondary | ICD-10-CM | POA: Diagnosis not present

## 2022-03-12 DIAGNOSIS — R10819 Abdominal tenderness, unspecified site: Secondary | ICD-10-CM | POA: Diagnosis not present

## 2022-03-12 DIAGNOSIS — Z7984 Long term (current) use of oral hypoglycemic drugs: Secondary | ICD-10-CM | POA: Diagnosis not present

## 2022-03-12 DIAGNOSIS — R112 Nausea with vomiting, unspecified: Secondary | ICD-10-CM | POA: Diagnosis not present

## 2022-03-12 DIAGNOSIS — R5383 Other fatigue: Secondary | ICD-10-CM | POA: Diagnosis not present

## 2022-03-12 DIAGNOSIS — E1010 Type 1 diabetes mellitus with ketoacidosis without coma: Secondary | ICD-10-CM | POA: Diagnosis not present

## 2022-03-12 DIAGNOSIS — F419 Anxiety disorder, unspecified: Secondary | ICD-10-CM | POA: Diagnosis not present

## 2022-03-31 DIAGNOSIS — E785 Hyperlipidemia, unspecified: Secondary | ICD-10-CM | POA: Diagnosis not present

## 2022-03-31 DIAGNOSIS — E1069 Type 1 diabetes mellitus with other specified complication: Secondary | ICD-10-CM | POA: Diagnosis not present

## 2022-03-31 DIAGNOSIS — Z6836 Body mass index (BMI) 36.0-36.9, adult: Secondary | ICD-10-CM | POA: Diagnosis not present

## 2022-03-31 DIAGNOSIS — E1065 Type 1 diabetes mellitus with hyperglycemia: Secondary | ICD-10-CM | POA: Diagnosis not present

## 2022-03-31 DIAGNOSIS — E109 Type 1 diabetes mellitus without complications: Secondary | ICD-10-CM | POA: Diagnosis not present

## 2022-04-02 ENCOUNTER — Ambulatory Visit (INDEPENDENT_AMBULATORY_CARE_PROVIDER_SITE_OTHER): Payer: Medicaid Other | Admitting: Clinical

## 2022-04-02 ENCOUNTER — Encounter (HOSPITAL_COMMUNITY): Payer: Self-pay

## 2022-04-02 DIAGNOSIS — F331 Major depressive disorder, recurrent, moderate: Secondary | ICD-10-CM | POA: Diagnosis not present

## 2022-04-02 DIAGNOSIS — F419 Anxiety disorder, unspecified: Secondary | ICD-10-CM

## 2022-04-02 NOTE — Plan of Care (Signed)
Verbal Consent 
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
## 2022-04-03 DIAGNOSIS — E1065 Type 1 diabetes mellitus with hyperglycemia: Secondary | ICD-10-CM | POA: Diagnosis not present

## 2022-05-03 DIAGNOSIS — E1065 Type 1 diabetes mellitus with hyperglycemia: Secondary | ICD-10-CM | POA: Diagnosis not present

## 2022-05-05 ENCOUNTER — Ambulatory Visit (INDEPENDENT_AMBULATORY_CARE_PROVIDER_SITE_OTHER): Payer: Medicaid Other

## 2022-05-05 DIAGNOSIS — Z3042 Encounter for surveillance of injectable contraceptive: Secondary | ICD-10-CM | POA: Diagnosis not present

## 2022-05-05 MED ORDER — MEDROXYPROGESTERONE ACETATE 150 MG/ML IM SUSP
150.0000 mg | Freq: Once | INTRAMUSCULAR | Status: AC
  Administered 2022-05-05: 150 mg via INTRAMUSCULAR

## 2022-05-05 NOTE — Progress Notes (Signed)
   NURSE VISIT- INJECTION  SUBJECTIVE:  Linda [name] is a 21 y.o. G60P1001 female here for a Depo Provera for contraception/period management. She is a GYN patient.   OBJECTIVE:  There were no vitals taken for this visit.  Appears well, in no apparent distress  Injection administered in: Left deltoid  Meds ordered this encounter  Medications   medroxyPROGESTERone (DEPO-PROVERA) injection 150 mg    ASSESSMENT: GYN patient Depo Provera for contraception/period management PLAN: Follow-up: in 11-13 weeks for next Depo   Andrez Grime  05/05/2022 2:22 PM

## 2022-06-03 DIAGNOSIS — E1065 Type 1 diabetes mellitus with hyperglycemia: Secondary | ICD-10-CM | POA: Diagnosis not present

## 2022-06-14 ENCOUNTER — Other Ambulatory Visit: Payer: Self-pay | Admitting: Family Medicine

## 2022-06-14 ENCOUNTER — Ambulatory Visit (INDEPENDENT_AMBULATORY_CARE_PROVIDER_SITE_OTHER): Payer: Medicaid Other | Admitting: Family Medicine

## 2022-06-14 ENCOUNTER — Encounter: Payer: Self-pay | Admitting: Family Medicine

## 2022-06-14 DIAGNOSIS — R6889 Other general symptoms and signs: Secondary | ICD-10-CM

## 2022-06-14 DIAGNOSIS — H6593 Unspecified nonsuppurative otitis media, bilateral: Secondary | ICD-10-CM | POA: Diagnosis not present

## 2022-06-14 DIAGNOSIS — J101 Influenza due to other identified influenza virus with other respiratory manifestations: Secondary | ICD-10-CM

## 2022-06-14 LAB — VERITOR FLU A/B WAIVED
Influenza A: POSITIVE — AB
Influenza B: NEGATIVE

## 2022-06-14 MED ORDER — OSELTAMIVIR PHOSPHATE 75 MG PO CAPS: 75.0000 mg | ORAL_CAPSULE | Freq: Two times a day (BID) | ORAL | 0 refills | Status: AC

## 2022-06-14 MED ORDER — FLUTICASONE PROPIONATE 50 MCG/ACT NA SUSP: 2.0000 | Freq: Every day | NASAL | 6 refills | Status: DC

## 2022-06-14 NOTE — Progress Notes (Signed)
Virtual Visit via telephone Note  I connected with Linda [name] on 06/14/22 at 1004 by telephone and verified that I am speaking with the correct person using two identifiers. Linda [name] is currently located at home and patient are currently with her during visit. The provider, Fransisca Kaufmann Marico Buckle, MD is located in their office at time of visit.  Call ended at 1010  I discussed the limitations, risks, security and privacy concerns of performing an evaluation and management service by telephone and the availability of in person appointments. I also discussed with the patient that there may be a patient responsible charge related to this service. The patient expressed understanding and agreed to proceed.   History and Present Illness: Patient is calling in for 2 days of body aches and coughing and sneezing and chills and is worsening.  Her right is hurting today as well. She just feels awful and has headaches and nausea.  She is using cough syrup and nausea medicine.  She has some sinus pressure. Her brother was sick but not as sick as her.   1. Flu-like symptoms   2. Middle ear effusion, bilateral     Outpatient Encounter Medications as of 06/14/2022  Medication Sig   busPIRone (BUSPAR) 15 MG tablet Take 1 tablet (15 mg total) by mouth 2 (two) times daily. For anxiety   Continuous Blood Gluc Sensor (DEXCOM G6 SENSOR) MISC SMARTSIG:Topical Every 10 Days   Continuous Blood Gluc Transmit (DEXCOM G6 TRANSMITTER) MISC CHANGE every three MONTHS as directed with G6 Sensor   desvenlafaxine (PRISTIQ) 50 MG 24 hr tablet Take 1 tablet (50 mg total) by mouth daily.   fluticasone (FLONASE) 50 MCG/ACT nasal spray Place 2 sprays into both nostrils daily.   glucagon 1 MG injection Inject 1 mg into the muscle once as needed (for blood sugar).   ibuprofen (ADVIL) 600 MG tablet Take 1 tablet (600 mg total) by mouth every 8 (eight) hours as needed.   insulin aspart (NOVOLOG) 100 UNIT/ML  injection Inject into the skin.   levothyroxine (SYNTHROID) 25 MCG tablet Take 1 tablet (25 mcg total) by mouth daily before breakfast.   MELATONIN PO Take by mouth as needed.   metFORMIN (GLUCOPHAGE-XR) 500 MG 24 hr tablet Take 1,000 mg by mouth every morning.   silver sulfADIAZINE (SILVADENE) 1 % cream Apply 1 application. topically daily.   [DISCONTINUED] fluticasone (FLONASE) 50 MCG/ACT nasal spray Place 2 sprays into both nostrils daily.   No facility-administered encounter medications on file as of 06/14/2022.    Review of Systems  Constitutional:  Negative for chills and fever.  HENT:  Positive for congestion, sinus pressure, sinus pain, sneezing and sore throat. Negative for ear discharge and ear pain.   Eyes:  Negative for visual disturbance.  Respiratory:  Positive for cough. Negative for chest tightness and shortness of breath.   Cardiovascular:  Negative for chest pain and leg swelling.  Genitourinary:  Negative for difficulty urinating and dysuria.  Musculoskeletal:  Negative for back pain and gait problem.  Skin:  Negative for rash.  Neurological:  Negative for light-headedness and headaches.  Psychiatric/Behavioral:  Negative for agitation and behavioral problems.   All other systems reviewed and are negative.   Observations/Objective: Patient sounds comfortable and in no acute distress.   Assessment and Plan: Problem List Items Addressed This Visit   None Visit Diagnoses     Flu-like symptoms    -  Primary   Relevant Medications   fluticasone (FLONASE)  50 MCG/ACT nasal spray   Other Relevant Orders   Novel Coronavirus, NAA ([facility])   Veritor Flu A/B Waived   Middle ear effusion, bilateral       Relevant Medications   fluticasone (FLONASE) 50 MCG/ACT nasal spray       Sent Flonase and recommended Mucinex, patient will come in and do flu and COVID swab. Follow up plan: Return if symptoms worsen or fail to improve.     I discussed the assessment and  treatment plan with the patient. The patient was provided an opportunity to ask questions and all were answered. The patient agreed with the plan and demonstrated an understanding of the instructions.   The patient was advised to call back or seek an in-person evaluation if the symptoms worsen or if the condition fails to improve as anticipated.  The above assessment and management plan was discussed with the patient. The patient verbalized understanding of and has agreed to the management plan. Patient is aware to call the clinic if symptoms persist or worsen. Patient is aware when to return to the clinic for a follow-up visit. Patient educated on when it is appropriate to go to the emergency department.    I provided 6 minutes of non-face-to-face time during this encounter.    Nils Pyle, MD

## 2022-06-14 NOTE — Progress Notes (Signed)
Please let the patient know that she came back flu positive and I sent in Tamiflu for her

## 2022-06-14 NOTE — Progress Notes (Signed)
Patient aware.

## 2022-06-15 LAB — NOVEL CORONAVIRUS, NAA: SARS-CoV-2, NAA: NOT DETECTED

## 2022-07-03 DIAGNOSIS — E1065 Type 1 diabetes mellitus with hyperglycemia: Secondary | ICD-10-CM | POA: Diagnosis not present

## 2022-07-06 DIAGNOSIS — Z9641 Presence of insulin pump (external) (internal): Secondary | ICD-10-CM | POA: Diagnosis not present

## 2022-07-06 DIAGNOSIS — F32A Depression, unspecified: Secondary | ICD-10-CM | POA: Diagnosis not present

## 2022-07-06 DIAGNOSIS — E1065 Type 1 diabetes mellitus with hyperglycemia: Secondary | ICD-10-CM | POA: Diagnosis not present

## 2022-07-15 ENCOUNTER — Telehealth: Payer: Self-pay | Admitting: Family Medicine

## 2022-07-15 DIAGNOSIS — F32A Depression, unspecified: Secondary | ICD-10-CM

## 2022-07-15 NOTE — Addendum Note (Signed)
Addended by: Brynda Peon F on: 07/15/2022 11:23 AM   Modules accepted: Orders

## 2022-07-15 NOTE — Telephone Encounter (Signed)
REFERRAL REQUEST Telephone Note  Have you been seen at our office for this problem? Yes. Pt says that she needs a new referral sent to [facility] [phone] (Advise that they may need an appointment with their PCP before a referral can be done)  Reason for Referral: weight management anxiety.depression and benign eating  Referral discussed with patient: yes  Best contact number of patient for referral team: [phone]    Has patient been seen by a specialist for this issue before: no  Patient provider preference for referral: [facility] Patient location preference for referral: [facility]   Patient notified that referrals can take up to a week or longer to process. If they haven't heard anything within a week they should call back and speak with the referral department.

## 2022-07-16 NOTE — Addendum Note (Signed)
Addended by: Janora Norlander on: 07/16/2022 11:17 AM   Modules accepted: Orders

## 2022-07-22 ENCOUNTER — Ambulatory Visit (INDEPENDENT_AMBULATORY_CARE_PROVIDER_SITE_OTHER): Payer: Medicaid Other | Admitting: *Deleted

## 2022-07-22 DIAGNOSIS — Z3042 Encounter for surveillance of injectable contraceptive: Secondary | ICD-10-CM | POA: Diagnosis not present

## 2022-07-22 MED ORDER — MEDROXYPROGESTERONE ACETATE 150 MG/ML IM SUSP
150.0000 mg | Freq: Once | INTRAMUSCULAR | Status: AC
  Administered 2022-07-22: 150 mg via INTRAMUSCULAR

## 2022-07-22 NOTE — Progress Notes (Signed)
   NURSE VISIT- INJECTION  SUBJECTIVE:  Linda [name] is a 22 y.o. G49P1001 female here for a Depo Provera for contraception/period management. She is a GYN patient.   OBJECTIVE:  There were no vitals taken for this visit.  Appears well, in no apparent distress  Injection administered in: Right deltoid  Meds ordered this encounter  Medications   medroxyPROGESTERone (DEPO-PROVERA) injection 150 mg    ASSESSMENT: GYN patient Depo Provera for contraception/period management PLAN: Follow-up: in 11-13 weeks for next Depo   Janece Canterbury  07/22/2022 2:45 PM

## 2022-08-03 DIAGNOSIS — E1065 Type 1 diabetes mellitus with hyperglycemia: Secondary | ICD-10-CM | POA: Diagnosis not present

## 2022-08-25 DIAGNOSIS — Z79890 Hormone replacement therapy (postmenopausal): Secondary | ICD-10-CM | POA: Diagnosis not present

## 2022-08-25 DIAGNOSIS — E10319 Type 1 diabetes mellitus with unspecified diabetic retinopathy without macular edema: Secondary | ICD-10-CM | POA: Diagnosis not present

## 2022-08-25 DIAGNOSIS — F32A Depression, unspecified: Secondary | ICD-10-CM | POA: Diagnosis not present

## 2022-08-25 DIAGNOSIS — E785 Hyperlipidemia, unspecified: Secondary | ICD-10-CM | POA: Diagnosis not present

## 2022-08-25 DIAGNOSIS — Z6836 Body mass index (BMI) 36.0-36.9, adult: Secondary | ICD-10-CM | POA: Diagnosis not present

## 2022-08-25 DIAGNOSIS — R632 Polyphagia: Secondary | ICD-10-CM | POA: Diagnosis not present

## 2022-08-25 DIAGNOSIS — E1065 Type 1 diabetes mellitus with hyperglycemia: Secondary | ICD-10-CM | POA: Diagnosis not present

## 2022-08-25 DIAGNOSIS — E1021 Type 1 diabetes mellitus with diabetic nephropathy: Secondary | ICD-10-CM | POA: Diagnosis not present

## 2022-08-25 DIAGNOSIS — Z7984 Long term (current) use of oral hypoglycemic drugs: Secondary | ICD-10-CM | POA: Diagnosis not present

## 2022-08-25 DIAGNOSIS — E1040 Type 1 diabetes mellitus with diabetic neuropathy, unspecified: Secondary | ICD-10-CM | POA: Diagnosis not present

## 2022-08-25 DIAGNOSIS — F419 Anxiety disorder, unspecified: Secondary | ICD-10-CM | POA: Diagnosis not present

## 2022-09-03 DIAGNOSIS — E1065 Type 1 diabetes mellitus with hyperglycemia: Secondary | ICD-10-CM | POA: Diagnosis not present

## 2022-09-09 ENCOUNTER — Encounter (HOSPITAL_BASED_OUTPATIENT_CLINIC_OR_DEPARTMENT_OTHER): Payer: Self-pay | Admitting: Emergency Medicine

## 2022-09-09 ENCOUNTER — Other Ambulatory Visit: Payer: Self-pay

## 2022-09-09 ENCOUNTER — Emergency Department (HOSPITAL_BASED_OUTPATIENT_CLINIC_OR_DEPARTMENT_OTHER)
Admission: EM | Admit: 2022-09-09 | Discharge: 2022-09-09 | Disposition: A | Discharge status: Home / Self Care | Payer: Medicaid Other | Attending: Emergency Medicine | Admitting: Emergency Medicine

## 2022-09-09 DIAGNOSIS — R946 Abnormal results of thyroid function studies: Secondary | ICD-10-CM | POA: Insufficient documentation

## 2022-09-09 DIAGNOSIS — R000 Tachycardia, unspecified: Secondary | ICD-10-CM | POA: Insufficient documentation

## 2022-09-09 DIAGNOSIS — R002 Palpitations: Secondary | ICD-10-CM

## 2022-09-09 DIAGNOSIS — E039 Hypothyroidism, unspecified: Secondary | ICD-10-CM | POA: Insufficient documentation

## 2022-09-09 DIAGNOSIS — R7989 Other specified abnormal findings of blood chemistry: Secondary | ICD-10-CM

## 2022-09-09 LAB — PREGNANCY, URINE: Preg Test, Ur: NEGATIVE

## 2022-09-09 LAB — URINALYSIS, ROUTINE W REFLEX MICROSCOPIC
Bilirubin Urine: NEGATIVE
Glucose, UA: 250 mg/dL — AB
Hgb urine dipstick: NEGATIVE
Ketones, ur: NEGATIVE mg/dL
Leukocytes,Ua: NEGATIVE
Nitrite: NEGATIVE
Protein, ur: NEGATIVE mg/dL
Specific Gravity, Urine: 1.008 (ref 1.005–1.030)
pH: 6 (ref 5.0–8.0)

## 2022-09-09 LAB — COMPREHENSIVE METABOLIC PANEL
ALT: 10 U/L (ref 0–44)
AST: 12 U/L — ABNORMAL LOW (ref 15–41)
Albumin: 4.6 g/dL (ref 3.5–5.0)
Alkaline Phosphatase: 63 U/L (ref 38–126)
Anion gap: 12 (ref 5–15)
BUN: 13 mg/dL (ref 6–20)
CO2: 21 mmol/L — ABNORMAL LOW (ref 22–32)
Calcium: 10.4 mg/dL — ABNORMAL HIGH (ref 8.9–10.3)
Chloride: 103 mmol/L (ref 98–111)
Creatinine, Ser: 0.97 mg/dL (ref 0.44–1.00)
GFR, Estimated: >60 mL/min (ref >60)
Glucose, Bld: 231 mg/dL — ABNORMAL HIGH (ref 70–99)
Potassium: 3.7 mmol/L (ref 3.5–5.1)
Sodium: 136 mmol/L (ref 135–145)
Total Bilirubin: 0.4 mg/dL (ref 0.3–1.2)
Total Protein: 8.3 g/dL — ABNORMAL HIGH (ref 6.5–8.1)

## 2022-09-09 LAB — CBC WITH DIFFERENTIAL/PLATELET
Abs Immature Granulocytes: 0.03 10*3/uL (ref 0.00–0.07)
Basophils Absolute: 0 10*3/uL (ref 0.0–0.1)
Basophils Relative: 0 %
Eosinophils Absolute: 0.1 10*3/uL (ref 0.0–0.5)
Eosinophils Relative: 1 %
HCT: 44.5 % (ref 36.0–46.0)
Hemoglobin: 14.8 g/dL (ref 12.0–15.0)
Immature Granulocytes: 0 %
Lymphocytes Relative: 31 %
Lymphs Abs: 2.9 10*3/uL (ref 0.7–4.0)
MCH: 29.1 pg (ref 26.0–34.0)
MCHC: 33.3 g/dL (ref 30.0–36.0)
MCV: 87.4 fL (ref 80.0–100.0)
Monocytes Absolute: 0.4 10*3/uL (ref 0.1–1.0)
Monocytes Relative: 4 %
Neutro Abs: 6 10*3/uL (ref 1.7–7.7)
Neutrophils Relative %: 64 %
Platelets: 300 10*3/uL (ref 150–400)
RBC: 5.09 MIL/uL (ref 3.87–5.11)
RDW: 12.4 % (ref 11.5–15.5)
WBC: 9.5 10*3/uL (ref 4.0–10.5)
nRBC: 0 % (ref 0.0–0.2)

## 2022-09-09 LAB — TSH: TSH: 7.442 u[IU]/mL — ABNORMAL HIGH (ref 0.350–4.500)

## 2022-09-09 LAB — TROPONIN I (HIGH SENSITIVITY)
Troponin I (High Sensitivity): <2 ng/L (ref <18)
Troponin I (High Sensitivity): <2 ng/L (ref <18)

## 2022-09-09 MED ORDER — SODIUM CHLORIDE 0.9 % IV BOLUS
1000.0000 mL | Freq: Once | INTRAVENOUS | Status: AC
  Administered 2022-09-09: 1000 mL via INTRAVENOUS

## 2022-09-09 NOTE — ED Notes (Signed)
Pt verbalized understanding of d/c instructions, meds, and followup care. Denies questions. VSS, no distress noted. Steady gait to exit with all belongings. 

## 2022-09-09 NOTE — ED Triage Notes (Signed)
Has had palpitations x months and today it started agagin and she had some cp when  it happens , had some diarrhea and nausea this am

## 2022-09-09 NOTE — ED Notes (Signed)
Ambulates to restroom without assistance. Standby only. Steady on feet. No dizzy feeling.

## 2022-09-09 NOTE — ED Provider Notes (Signed)
[facility] Provider Note   CSN: [id number] Arrival date & time: 09/09/22  1759     History  Chief Complaint  Patient presents with   Palpitations    Linda [name] is a 22 y.o. female.  Patient is a 22 year old female who presents with palpitations.  She says they have been going on for months but have recently gotten more frequent and more intense.  She describes episodes where she has a brief feeling that her heart is pounding although does not really feel like it is racing.  Normally it only lasts a few seconds.  Today it was more intense and she had some pain in the center of her chest associated with some lightheadedness and says she got sweaty.  It only lasted for a few seconds but she still feels a little sore in the center of her chest.  No shortness of breath.  No leg swelling.  No known prior history of heart disease.  No fevers or other recent illnesses other than she did have some diarrhea during the night last night and early morning but that has resolved.  No abdominal pain.  No vomiting.  No cough or cold symptoms.  She does say that she normally has a fast heart rate and it is normally in the 100s.  She has been told in the past that she has tachycardia but not sure why.  She did have hypothyroidism during her pregnancy and was on Synthroid but no longer takes this medication and was reported to have thyroid studies following her pregnancy.       Home Medications Prior to Admission medications   Medication Sig Start Date End Date Taking? Authorizing Provider  busPIRone (BUSPAR) 15 MG tablet Take 1 tablet (15 mg total) by mouth 2 (two) times daily. For anxiety 01/20/22   Janora Norlander, DO  Continuous Blood Gluc Sensor (DEXCOM G6 SENSOR) MISC SMARTSIG:Topical Every 10 Days 10/10/20   [provider]  Continuous Blood Gluc Transmit (DEXCOM G6 TRANSMITTER) MISC CHANGE every three MONTHS as directed with G6 Sensor  07/23/21   [provider]  desvenlafaxine (PRISTIQ) 50 MG 24 hr tablet Take 1 tablet (50 mg total) by mouth daily. 01/20/22   Janora Norlander, DO  fluticasone (FLONASE) 50 MCG/ACT nasal spray Place 2 sprays into both nostrils daily. 06/14/22   Dettinger, Fransisca Kaufmann, MD  glucagon 1 MG injection Inject 1 mg into the muscle once as needed (for blood sugar).    [provider]  ibuprofen (ADVIL) 600 MG tablet Take 1 tablet (600 mg total) by mouth every 8 (eight) hours as needed. 07/07/21   Ivy Lynn, NP  insulin aspart (NOVOLOG) 100 UNIT/ML injection Inject into the skin. 08/31/21   [provider]  levothyroxine (SYNTHROID) 25 MCG tablet Take 1 tablet (25 mcg total) by mouth daily before breakfast. 12/15/20   Roma Schanz, CNM  MELATONIN PO Take by mouth as needed.    [provider]  metFORMIN (GLUCOPHAGE-XR) 500 MG 24 hr tablet Take 1,000 mg by mouth every morning. 08/06/21   [provider]  silver sulfADIAZINE (SILVADENE) 1 % cream Apply 1 application. topically daily. 12/01/21   Claretta Fraise, MD      Allergies    Patient has no known allergies.    Review of Systems   Review of Systems  Constitutional:  Positive for diaphoresis. Negative for chills, fatigue and fever.  HENT:  Negative for congestion, rhinorrhea  and sneezing.   Eyes: Negative.   Respiratory:  Negative for cough, chest tightness and shortness of breath.   Cardiovascular:  Positive for chest pain and palpitations. Negative for leg swelling.  Gastrointestinal:  Negative for abdominal pain, blood in stool, diarrhea, nausea and vomiting.  Genitourinary:  Negative for difficulty urinating, flank pain, frequency and hematuria.  Musculoskeletal:  Negative for arthralgias and back pain.  Skin:  Negative for rash.  Neurological:  Negative for dizziness, speech difficulty, weakness, numbness and headaches.    Physical Exam Updated Vital Signs BP (!) 129/91   Pulse 95   Temp  97.8 F (36.6 C)   Resp (!) 22   Ht '5\' 2"'$  (1.575 m)   Wt 94.6 kg   SpO2 99%   BMI 38.15 kg/m  Physical Exam Constitutional:      Appearance: She is well-developed.  HENT:     Head: Normocephalic and atraumatic.  Eyes:     Pupils: Pupils are equal, round, and reactive to light.  Cardiovascular:     Rate and Rhythm: Regular rhythm. Tachycardia present.     Heart sounds: Normal heart sounds.  Pulmonary:     Effort: Pulmonary effort is normal. No respiratory distress.     Breath sounds: Normal breath sounds. No wheezing or rales.  Chest:     Chest wall: No tenderness.  Abdominal:     General: Bowel sounds are normal.     Palpations: Abdomen is soft.     Tenderness: There is no abdominal tenderness. There is no guarding or rebound.  Musculoskeletal:        General: Normal range of motion.     Cervical back: Normal range of motion and neck supple.     Comments: No edema or calf tenderness  Lymphadenopathy:     Cervical: No cervical adenopathy.  Skin:    General: Skin is warm and dry.     Findings: No rash.  Neurological:     Mental Status: She is alert and oriented to person, place, and time.     ED Results / Procedures / Treatments   Labs (all labs ordered are listed, but only abnormal results are displayed) Labs Reviewed  COMPREHENSIVE METABOLIC PANEL - Abnormal; Notable for the following components:      Result Value   CO2 21 (*)    Glucose, Bld 231 (*)    Calcium 10.4 (*)    Total Protein 8.3 (*)    AST 12 (*)    All other components within normal limits  URINALYSIS, ROUTINE W REFLEX MICROSCOPIC - Abnormal; Notable for the following components:   Color, Urine COLORLESS (*)    Glucose, UA 250 (*)    All other components within normal limits  TSH - Abnormal; Notable for the following components:   TSH 7.442 (*)    All other components within normal limits  CBC WITH DIFFERENTIAL/PLATELET  PREGNANCY, URINE  TROPONIN I (HIGH SENSITIVITY)  TROPONIN I (HIGH  SENSITIVITY)    EKG EKG Interpretation  Date/Time:  Thursday September 09 2022 18:05:26 EST Ventricular Rate:  125 PR Interval:  138 QRS Duration: 74 QT Interval:  286 QTC Calculation: 412 R Axis:   90 Text Interpretation: Sinus tachycardia Rightward axis Borderline ECG When compared with ECG of 08-Apr-2021 17:42, Questionable change in QRS axis Nonspecific T wave abnormality has replaced inverted T waves in Inferior leads since last tracing no significant change Confirmed by Malvin Johns [phone]) on 09/09/2022 7:48:10 PM  Radiology No results found.  Procedures Procedures    Medications Ordered in ED Medications  sodium chloride 0.9 % bolus 1,000 mL (0 mLs Intravenous Stopped 09/09/22 2006)    ED Course/ Medical Decision Making/ A&P                             Medical Decision Making Amount and/or Complexity of Data Reviewed External Data Reviewed: notes. Labs: ordered. Decision-making details documented in ED Course. ECG/medicine tests: ordered and independent interpretation performed. Decision-making details documented in ED Course.  Risk Decision regarding hospitalization.   Patient is a 22 year old female who presents with palpitations.  Her EKG shows sinus tachycardia.  Similar to her prior EKG.  She has had 2 negative troponins.  Her heart rate improved after IV fluids and is in the 90s currently.  No arrhythmias have been noted during the ED stay.  Her labs are nonconcerning other than a TSH is elevated.  She had a prior history of hypothyroidism but says it resolved after her pregnancy.  She is currently asymptomatic.  She does not have other symptoms that would be more suggestive of infection.  No symptoms more concerning for PE.  No shortness of breath.  Her glucose is elevated but there is no suggestions of DKA.  No anion gap.  She was given IV fluids.  She is currently asymptomatic with stable vital signs and a normal heart rate.  She was discharged home in good  condition.  She was advised to follow-up with her endocrinologist to have her thyroid panel rechecked.  She was also advised that given her increased frequency of palpitations, it would be prudent to have some longer heart monitoring.  I did give her information about following up with a cardiologist but advised that she can start with her primary care doctor and either have them arrange it or follow-up with a cardiologist.  Return precautions were given. Final Clinical Impression(s) / ED Diagnoses Final diagnoses:  Palpitations  Elevated TSH    Rx / DC Orders ED Discharge Orders     None         Malvin Johns, MD 09/09/22 2241

## 2022-09-09 NOTE — Discharge Instructions (Signed)
You need to have your thyroid test rechecked by either your endocrinologist or primary care doctor.  I would recommend that you have further ongoing heart monitoring done.  I have given you information about following up with a cardiologist.  You can either follow-up with your primary care doctor or the cardiologist regarding this.  Return to the emergency room if you have any worsening symptoms.

## 2022-09-09 NOTE — ED Notes (Signed)
EKG not crossing over, given to Dr. Tamera Punt

## 2022-10-04 ENCOUNTER — Ambulatory Visit (INDEPENDENT_AMBULATORY_CARE_PROVIDER_SITE_OTHER): Payer: Medicaid Other | Admitting: Family

## 2022-10-04 ENCOUNTER — Other Ambulatory Visit: Payer: Self-pay | Admitting: Advanced Practice Midwife

## 2022-10-04 ENCOUNTER — Encounter: Payer: Self-pay | Admitting: Family

## 2022-10-04 VITALS — BP 120/82 | HR 107 | Temp 97.5°F | Ht 62.0 in | Wt 203.8 lb

## 2022-10-04 DIAGNOSIS — R109 Unspecified abdominal pain: Secondary | ICD-10-CM

## 2022-10-04 MED ORDER — NAPROXEN 500 MG PO TABS: 500.0000 mg | ORAL_TABLET | Freq: Two times a day (BID) | ORAL | 1 refills | Status: DC

## 2022-10-04 NOTE — Patient Instructions (Signed)

## 2022-10-04 NOTE — Progress Notes (Signed)
Subjective:    Patient ID: Linda [name], female    DOB: 12/25/2000, 22 y.o.   MRN: [id number]  Chief Complaint  Patient presents with   Abdominal Pain    Soreness in pelvic area where she would have period cramps has not had a period in almost a year   PT presents to the office today with abdominal pain that started Thursday that started in lower abdominal. It has gradually improved, but reports a mild achy pain 2-3 out 10.  Abdominal Pain This is a new problem. The current episode started 1 to 4 weeks ago. The onset quality is sudden. The pain is located in the LLQ and RLQ. The pain is at a severity of 0/10 (when walking 3, but Thursday 7 or 8). The pain is mild. The quality of the pain is described as aching. Associated symptoms include diarrhea and nausea. Pertinent negatives include no belching, constipation, fever, flatus or frequency. Treatments tried: rest. The treatment provided moderate relief.      Review of Systems  Constitutional:  Negative for fever.  Gastrointestinal:  Positive for abdominal pain, diarrhea and nausea. Negative for constipation and flatus.  Genitourinary:  Negative for frequency.  All other systems reviewed and are negative.      Objective:   Physical Exam Vitals reviewed.  Constitutional:      General: She is not in acute distress.    Appearance: She is well-developed. She is obese.  HENT:     Head: Normocephalic and atraumatic.     Right Ear: External ear normal.  Eyes:     Pupils: Pupils are equal, round, and reactive to light.  Neck:     Thyroid: No thyromegaly.  Cardiovascular:     Rate and Rhythm: Normal rate and regular rhythm.     Heart sounds: Normal heart sounds. No murmur heard. Pulmonary:     Effort: Pulmonary effort is normal. No respiratory distress.     Breath sounds: Normal breath sounds. No wheezing.  Abdominal:     General: Bowel sounds are normal. There is no distension.     Palpations: Abdomen is soft.      Tenderness: There is no abdominal tenderness (no tenderness on exam).  Musculoskeletal:        General: No tenderness. Normal range of motion.     Cervical back: Normal range of motion and neck supple.  Skin:    General: Skin is warm and dry.  Neurological:     Mental Status: She is alert and oriented to person, place, and time.     Cranial Nerves: No cranial nerve deficit.     Deep Tendon Reflexes: Reflexes are normal and symmetric.  Psychiatric:        Behavior: Behavior normal.        Thought Content: Thought content normal.        Judgment: Judgment normal.      BP 120/82   Pulse (!) 107   Temp (!) 97.5 F (36.4 C) (Temporal)   Ht 5\' 2"  (1.575 m)   Wt 203 lb 12.8 oz (92.4 kg)   LMP 10/03/2021   SpO2 97%   BMI 37.28 kg/m       Assessment & Plan:  Linda [name] comes in today with chief complaint of Abdominal Pain (Soreness in pelvic area where she would have period cramps has not had a period in almost a year)   Diagnosis and orders addressed:  1. Abdominal pain, unspecified abdominal location More  than likely given symptoms pt had ovarian cyst rupture. She does not wish to have transvaginal at this time Pain is  greatly improve and states pain is a 0 at this time Will give naprosyn as needed If pain returns or worsens will need transvaginal - naproxen (NAPROSYN) 500 MG tablet; Take 1 tablet (500 mg total) by mouth 2 (two) times daily with a meal.  Dispense: 60 tablet; Refill: [place], FNP

## 2022-10-06 ENCOUNTER — Ambulatory Visit (INDEPENDENT_AMBULATORY_CARE_PROVIDER_SITE_OTHER): Payer: Medicaid Other | Admitting: Psychiatry

## 2022-10-06 ENCOUNTER — Encounter (HOSPITAL_COMMUNITY): Payer: Self-pay | Admitting: Psychiatry

## 2022-10-06 DIAGNOSIS — F410 Panic disorder [episodic paroxysmal anxiety] without agoraphobia: Secondary | ICD-10-CM

## 2022-10-06 DIAGNOSIS — M797 Fibromyalgia: Secondary | ICD-10-CM

## 2022-10-06 DIAGNOSIS — F4310 Post-traumatic stress disorder, unspecified: Secondary | ICD-10-CM | POA: Diagnosis not present

## 2022-10-06 DIAGNOSIS — F411 Generalized anxiety disorder: Secondary | ICD-10-CM

## 2022-10-06 DIAGNOSIS — E559 Vitamin D deficiency, unspecified: Secondary | ICD-10-CM

## 2022-10-06 DIAGNOSIS — Z789 Other specified health status: Secondary | ICD-10-CM

## 2022-10-06 DIAGNOSIS — G4709 Other insomnia: Secondary | ICD-10-CM | POA: Diagnosis not present

## 2022-10-06 DIAGNOSIS — Z9152 Personal history of nonsuicidal self-harm: Secondary | ICD-10-CM | POA: Diagnosis not present

## 2022-10-06 DIAGNOSIS — F331 Major depressive disorder, recurrent, moderate: Secondary | ICD-10-CM

## 2022-10-06 DIAGNOSIS — Z9151 Personal history of suicidal behavior: Secondary | ICD-10-CM

## 2022-10-06 DIAGNOSIS — F502 Bulimia nervosa, unspecified: Secondary | ICD-10-CM

## 2022-10-06 DIAGNOSIS — F1290 Cannabis use, unspecified, uncomplicated: Secondary | ICD-10-CM | POA: Insufficient documentation

## 2022-10-06 DIAGNOSIS — F4000 Agoraphobia, unspecified: Secondary | ICD-10-CM
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
## 2022-10-06 NOTE — Patient Instructions (Signed)
We did not add any medications to her regimen today.  We will see if we have availability for cognitive behavioral therapy with eating disorder focus in our clinic but you may also want to reach out to Medicaid to see if there is a provider that is in network as well.  I will reach out to your PCP to coordinate about getting a nutrition referral and those blood tests that we were talking about.

## 2022-10-08 DIAGNOSIS — E1065 Type 1 diabetes mellitus with hyperglycemia: Secondary | ICD-10-CM | POA: Diagnosis not present

## 2022-10-11 ENCOUNTER — Ambulatory Visit (INDEPENDENT_AMBULATORY_CARE_PROVIDER_SITE_OTHER): Payer: Medicaid Other | Admitting: *Deleted

## 2022-10-11 DIAGNOSIS — Z3042 Encounter for surveillance of injectable contraceptive: Secondary | ICD-10-CM | POA: Diagnosis not present

## 2022-10-11 MED ORDER — MEDROXYPROGESTERONE ACETATE 150 MG/ML IM SUSY
150.0000 mg | PREFILLED_SYRINGE | Freq: Once | INTRAMUSCULAR | Status: AC
  Administered 2022-10-11: 150 mg via INTRAMUSCULAR

## 2022-10-11 NOTE — Progress Notes (Signed)
   NURSE VISIT- INJECTION  SUBJECTIVE:  Linda [name] is a 22 y.o. [id number] female here for a Depo Provera for contraception/period management. She is a GYN patient.   OBJECTIVE:  LMP 10/03/2021   Appears well, in no apparent distress  Injection administered in: Left deltoid  Meds ordered this encounter  Medications   medroxyPROGESTERone Acetate SUSY 150 mg    ASSESSMENT: GYN patient Depo Provera for contraception/period management PLAN: Follow-up: in 11-13 weeks for next Depo   Malachy Mood  10/11/2022 2:27 PM

## 2022-10-15 ENCOUNTER — Ambulatory Visit: Payer: Medicaid Other | Attending: Cardiology | Admitting: Cardiology

## 2022-11-05 ENCOUNTER — Ambulatory Visit (INDEPENDENT_AMBULATORY_CARE_PROVIDER_SITE_OTHER): Payer: Medicaid Other | Admitting: Family Medicine

## 2022-11-05 ENCOUNTER — Telehealth: Payer: Self-pay | Admitting: Family Medicine

## 2022-11-05 VITALS — BP 139/82 | Temp 98.3°F | Ht 62.0 in | Wt 205.0 lb

## 2022-11-05 DIAGNOSIS — L723 Sebaceous cyst: Secondary | ICD-10-CM

## 2022-11-05 DIAGNOSIS — M353 Polymyalgia rheumatica: Secondary | ICD-10-CM

## 2022-11-05 DIAGNOSIS — F32A Depression, unspecified: Secondary | ICD-10-CM | POA: Diagnosis not present

## 2022-11-05 DIAGNOSIS — F419 Anxiety disorder, unspecified: Secondary | ICD-10-CM

## 2022-11-05 DIAGNOSIS — F502 Bulimia nervosa: Secondary | ICD-10-CM

## 2022-11-05 DIAGNOSIS — E559 Vitamin D deficiency, unspecified: Secondary | ICD-10-CM | POA: Diagnosis not present

## 2022-11-05 NOTE — Progress Notes (Unsigned)
Subjective: CC:*** PCP: Raliegh Ip, DO WUJ:[id number] Lynnix Donarski is a 22 y.o. female presenting to clinic today for:  1. ***   ROS: Per HPI  No Known Allergies Past Medical History:  Diagnosis Date   Allergy    Anxiety    Asthma    Carpal tunnel syndrome    Depression    Diabetes mellitus without complication (HCC) 2014   Type 1   Palpitations    Tachycardia    Tinnitus     Current Outpatient Medications:    Continuous Blood Gluc Sensor (DEXCOM G6 SENSOR) MISC, SMARTSIG:Topical Every 10 Days, Disp: , Rfl:    Continuous Blood Gluc Transmit (DEXCOM G6 TRANSMITTER) MISC, CHANGE every three MONTHS as directed with G6 Sensor, Disp: , Rfl:    glucagon 1 MG injection, Inject 1 mg into the muscle once as needed (for blood sugar)., Disp: , Rfl:    ibuprofen (ADVIL) 600 MG tablet, Take 1 tablet (600 mg total) by mouth every 8 (eight) hours as needed., Disp: 30 tablet, Rfl: 0   insulin aspart (NOVOLOG) 100 UNIT/ML injection, Inject into the skin., Disp: , Rfl:    medroxyPROGESTERone (DEPO-PROVERA) 150 MG/ML injection, INJECT 1 ML (150 MG TOTAL) INTO THE MUSCLE EVERY 3 (THREE) MONTHS, Disp: 1 mL, Rfl: 4   metFORMIN (GLUCOPHAGE-XR) 500 MG 24 hr tablet, Take 1,000 mg by mouth every morning., Disp: , Rfl:  Social History   Socioeconomic History   Marital status: Media planner    Spouse name: Not on file   Number of children: Not on file   Years of education: Not on file   Highest education level: 12th grade  Occupational History   Not on file  Tobacco Use   Smoking status: Never    Passive exposure: Yes   Smokeless tobacco: Never  Vaping Use   Vaping Use: Every day  Substance and Sexual Activity   Alcohol use: Yes    Comment: 1 beast or simply lemonade once weekly on the weekend   Drug use: Yes    Types: Marijuana    Comment: THC-A or delta 8 or delta 9 nightly.  Tried edibles twice   Sexual activity: Yes    Birth control/protection: Condom, Injection  Other  Topics Concern   Not on file  Social History Narrative   Not on file   Social Determinants of Health   Financial Resource Strain: Low Risk  (11/05/2022)   Overall Financial Resource Strain (CARDIA)    Difficulty of Paying Living Expenses: Not hard at all  Food Insecurity: No Food Insecurity (11/05/2022)   Hunger Vital Sign    Worried About Running Out of Food in the Last Year: Never true    Ran Out of Food in the Last Year: Never true  Transportation Needs: No Transportation Needs (11/05/2022)   PRAPARE - Administrator, Civil Service (Medical): No    Lack of Transportation (Non-Medical): No  Physical Activity: Unknown (11/05/2022)   Exercise Vital Sign    Days of Exercise per Week: 0 days    Minutes of Exercise per Session: Not on file  Stress: Stress Concern Present (11/05/2022)   [employer] of Occupational Health - Occupational Stress Questionnaire    Feeling of Stress : Very much  Social Connections: Moderately Isolated (11/05/2022)   Social Connection and Isolation Panel [NHANES]    Frequency of Communication with Friends and Family: More than three times a week    Frequency of Social Gatherings with Friends  and Family: More than three times a week    Attends Religious Services: Never    Active Member of Clubs or Organizations: No    Attends Banker Meetings: Not on file    Marital Status: Living with partner  Intimate Partner Violence: Not At Risk (07/17/2021)   Humiliation, Afraid, Rape, and Kick questionnaire    Fear of Current or Ex-Partner: No    Emotionally Abused: No    Physically Abused: No    Sexually Abused: No   Family History  Problem Relation Age of Onset   Hypertension Mother    Thyroid disease Mother    Asthma Brother    Thyroid disease Brother    COPD Paternal Grandmother    Hypertension Paternal Grandmother    COPD Maternal Grandmother    Lumbar disc disease Maternal Grandmother    Dementia Maternal Grandmother    Heart attack  Maternal Grandfather    Emphysema Maternal Grandfather    Ankylosing spondylitis Maternal Grandfather    Dementia Maternal Grandfather     Objective: Office vital signs reviewed. BP (!) 152/87   Temp 98.3 F (36.8 C)   Ht 5\' 2"  (1.575 m)   Wt 205 lb (93 kg)   Breastfeeding No   BMI 37.49 kg/m   Physical Examination:  General: Awake, alert, *** nourished, No acute distress HEENT: Normal    Neck: No masses palpated. No lymphadenopathy    Ears: Tympanic membranes intact, normal light reflex, no erythema, no bulging    Eyes: PERRLA, extraocular membranes intact, sclera ***    Nose: nasal turbinates moist, *** nasal discharge    Throat: moist mucus membranes, no erythema, *** tonsillar exudate.  Airway is patent Cardio: regular rate and rhythm, S1S2 heard, no murmurs appreciated Pulm: clear to auscultation bilaterally, no wheezes, rhonchi or rales; normal work of breathing on room air GI: soft, non-tender, non-distended, bowel sounds present x4, no hepatomegaly, no splenomegaly, no masses GU: external vaginal tissue ***, cervix ***, *** punctate lesions on cervix appreciated, *** discharge from cervical os, *** bleeding, *** cervical motion tenderness, *** abdominal/ adnexal masses Extremities: warm, well perfused, No edema, cyanosis or clubbing; +*** pulses bilaterally MSK: *** gait and *** station Skin: dry; intact; no rashes or lesions Neuro: *** Strength and light touch sensation grossly intact, *** DTRs ***/4  Assessment/ Plan: 22 y.o. female   ***  Orders Placed This Encounter  Procedures   VITAMIN D 25 Hydroxy (Vit-D Deficiency, Fractures)   Vitamin B12   Magnesium   Folate   TSH   T4, Free   No orders of the defined types were placed in this encounter.    Raliegh Ip, DO [facility] [phone]

## 2022-11-05 NOTE — Patient Instructions (Signed)
Epidermoid Cyst  An epidermoid cyst, also called an epidermal cyst, is a small lump under your skin. The cyst contains a substance called keratin. Do not try to pop or open the cyst yourself. What are the causes? A blocked hair follicle. A hair that curls and re-enters the skin instead of growing straight out of the skin. A blocked pore. Irritated skin. An injury to the skin. Certain conditions that are passed along from parent to child. Human papillomavirus (HPV). This happens rarely when cysts occur on the bottom of the feet. Long-term sun damage to the skin. What increases the risk? Having acne. Being female. Having an injury to the skin. Being past puberty. Having certain conditions caused by genes (genetic disorder) What are the signs or symptoms? These cysts are usually harmless, but they can get infected. Symptoms of infection may include: Redness. Inflammation. Tenderness. Warmth. Fever. A bad-smelling substance that drains from the cyst. Pus that drains from the cyst. How is this treated? In many cases, epidermoid cysts go away on their own without treatment. If a cyst becomes infected, treatment may include: Opening and draining the cyst, done by a doctor. After draining, you may need minor surgery to remove the rest of the cyst. Antibiotic medicine. Shots of medicines (steroids) that help to reduce inflammation. Surgery to remove the cyst. Surgery may be done if the cyst: Becomes large. Bothers you. Has a chance of turning into cancer. Do not try to open a cyst yourself. Follow these instructions at home: Medicines Take over-the-counter and prescription medicines as told by your doctor. If you were prescribed an antibiotic medicine, take it as told by your doctor. Do not stop taking it even if you start to feel better. General instructions Keep the area around your cyst clean and dry. Wear loose, dry clothing. Avoid touching your cyst. Check your cyst every day  for signs of infection. Check for: Redness, swelling, or pain. Fluid or blood. Warmth. Pus or a bad smell. Keep all follow-up visits. How is this prevented? Wear clean, dry, clothing. Avoid wearing tight clothing. Keep your skin clean and dry. Take showers or baths every day. Contact a doctor if: Your cyst has symptoms of infection. Your condition does not improve or gets worse. You have a cyst that looks different from other cysts you have had. You have a fever. Get help right away if: Redness spreads from the cyst into the area close by. Summary An epidermoid cyst is a small lump under your skin. If a cyst becomes infected, treatment may include surgery to open and drain the cyst, or to remove it. Take over-the-counter and prescription medicines only as told by your doctor. Contact a doctor if your condition is not improving or is getting worse. Keep all follow-up visits. This information is not intended to replace advice given to you by your health care provider. Make sure you discuss any questions you have with your health care provider. Document Revised: 09/26/2019 Document Reviewed: 09/26/2019 Elsevier Patient Education  2023 Elsevier Inc.  

## 2022-11-06 LAB — CBC
Hematocrit: 43.3 % (ref 34.0–46.6)
Hemoglobin: 14.3 g/dL (ref 11.1–15.9)
MCH: 30 pg (ref 26.6–33.0)
MCHC: 33 g/dL (ref 31.5–35.7)
MCV: 91 fL (ref 79–97)
Platelets: 302 10*3/uL (ref 150–450)
RBC: 4.76 x10E6/uL (ref 3.77–5.28)
RDW: 12.7 % (ref 11.7–15.4)
WBC: 8.6 10*3/uL (ref 3.4–10.8)

## 2022-11-06 LAB — ANA W/REFLEX IF POSITIVE: Anti Nuclear Antibody (ANA): NEGATIVE

## 2022-11-06 LAB — SEDIMENTATION RATE: Sed Rate: 23 mm/hr (ref 0–32)

## 2022-11-06 LAB — VITAMIN D 25 HYDROXY (VIT D DEFICIENCY, FRACTURES): Vit D, 25-Hydroxy: 15.4 ng/mL — ABNORMAL LOW (ref 30.0–100.0)

## 2022-11-06 LAB — VITAMIN B12: Vitamin B-12: 250 pg/mL (ref 232–1245)

## 2022-11-06 LAB — C-REACTIVE PROTEIN: CRP: 2 mg/L (ref 0–10)

## 2022-11-06 LAB — TSH: TSH: 4.24 u[IU]/mL (ref 0.450–4.500)

## 2022-11-06 LAB — FOLATE: Folate: 7.6 ng/mL (ref >3.0)

## 2022-11-06 LAB — T4, FREE: Free T4: 1.63 ng/dL (ref 0.82–1.77)

## 2022-11-06 LAB — MAGNESIUM: Magnesium: 2 mg/dL (ref 1.6–2.3)

## 2022-11-07 DIAGNOSIS — E1065 Type 1 diabetes mellitus with hyperglycemia: Secondary | ICD-10-CM | POA: Diagnosis not present

## 2022-11-08 ENCOUNTER — Other Ambulatory Visit: Payer: Self-pay | Admitting: Family Medicine

## 2022-11-08 DIAGNOSIS — E559 Vitamin D deficiency, unspecified: Secondary | ICD-10-CM

## 2022-11-08 MED ORDER — VITAMIN D (ERGOCALCIFEROL) 1.25 MG (50000 UNIT) PO CAPS: 50000.0000 [IU] | ORAL_CAPSULE | ORAL | 0 refills | Status: AC

## 2022-11-09 ENCOUNTER — Ambulatory Visit (INDEPENDENT_AMBULATORY_CARE_PROVIDER_SITE_OTHER): Payer: Medicaid Other | Admitting: Clinical

## 2022-11-09 DIAGNOSIS — F331 Major depressive disorder, recurrent, moderate: Secondary | ICD-10-CM | POA: Diagnosis not present

## 2022-11-09 DIAGNOSIS — F419 Anxiety disorder, unspecified: Secondary | ICD-10-CM | POA: Diagnosis not present

## 2022-11-09 NOTE — Progress Notes (Signed)
Virtual Visit via Video Note  I connected with Linda [name] on 11/09/22 at  2:00 PM EDT by a video enabled telemedicine application and verified that I am speaking with the correct person using two identifiers.  Location: Patient: Home Provider: Office    I discussed the limitations of evaluation and management by telemedicine and the availability of in person appointments. The patient expressed understanding and agreed to proceed.   THERAPIST PROGRESS NOTE     Session Time: 3:00 PM-3:55 PM   Participation Level: Active   Behavioral Response: Casual and Alert,Depressed   Type of Therapy: Individual Therapy   Treatment Goals addressed: Depression and Anxiety   Interventions: CBT   Summary: Linda [name] a 22 y.o. female who presents with  Recurrent MDD with Anxiety. The OPT therapist worked with the patient for her OPT treatment. The OPT therapist utilized Motivational Interviewing to assist in creating therapeutic repore. The patient in the session was engaged and work in collaboration giving feedback about her triggers and symptoms over the past few weeks. The patient spoke about interactions with  her family primarily her parents as being a stressor. The patient spoke about her goals of getting back into the gym, eating better, and getting her health back to a better place. The OPT therapist utilized Cognitive Behavioral Therapy through cognitive restructuring as well as worked with the patient on coping strategies to assist in management of mood and anxiety.The OPT therapist reviewed as well with the patient basic need areas examining the patients current eating habits, sleep schedule, exercise, and hygiene. The OPT therapist  did overview the importance of placing boundaries with her family.   Suicidal/Homicidal: Nowithout intent/plan   Therapist Response:The OPT therapist worked with the patient for the patients scheduled session. The patient was engaged in her session  and gave feedback in relation to triggers, symptoms, and behavior responses over the past few weeks. The OPT therapist worked with the patient utilizing an in session Cognitive Behavioral Therapy exercise. The patient was responsive in the session and verbalized, " My parents getting into conflict and coming over and me getting pulled into their conflict". The patient spoke about awareness of maintaining boundaries with her family.The OPT therapist worked with the patient providing ongoing psycho-education. The OPT therapist worked with the patient evaluating her use of coping to balance her external stressors. The patient spoke about her goals. The patient spoke about her med management and the OPT therapist over-viewed upcoming appointments as listed in her MyChart. The OPT therapist will work with the patient in her next scheduled session.     Plan: return in 2/3 weeks    Diagnosis:      Axis I: Recurrent moderate  MDD with anxiety    Axis II: No diagnosis       Collaboration of Care: Overview of the patients involvement in the med therapy program with Dr. Adrian Blackwater.   Patient/Guardian was advised Release of Information must be obtained prior to any record release in order to collaborate their care with an outside provider. Patient/Guardian was advised if they have not already done so to contact the registration department to sign all necessary forms in order for [place] to release information regarding their care.    Consent: Patient/Guardian gives verbal consent for treatment and assignment of benefits for services provided during this visit. Patient/Guardian expressed understanding and agreed to proceed.    I discussed the assessment and treatment plan with the patient. The patient was provided an opportunity  to ask questions and all were answered. The patient agreed with the plan and demonstrated an understanding of the instructions.   The patient was advised to call back or seek an in-person  evaluation if the symptoms worsen or if the condition fails to improve as anticipated.   I provided 30 minutes of non-face-to-face time during this encounter.   Winfred Burn, LCSW   11/09/2022

## 2022-11-30 ENCOUNTER — Ambulatory Visit: Payer: Medicaid Other | Attending: Cardiology | Admitting: Cardiology

## 2022-12-01 ENCOUNTER — Encounter: Payer: Self-pay | Admitting: Cardiology

## 2022-12-10 ENCOUNTER — Telehealth: Payer: Self-pay | Admitting: Pharmacist

## 2022-12-10 ENCOUNTER — Telehealth: Payer: Medicaid Other

## 2022-12-10 NOTE — Telephone Encounter (Signed)
    12/10/2022 Name: Linda [name] MRN: [id number] DOB: 11-Nov-2000   Unsuccessful outreach to patient and voicemail box was full.  Patient to be rescheduled.   Kieth Brightly, PharmD, BCACP Clinical Pharmacist, [facility]

## 2022-12-14 ENCOUNTER — Ambulatory Visit (INDEPENDENT_AMBULATORY_CARE_PROVIDER_SITE_OTHER): Payer: Medicaid Other | Admitting: Clinical

## 2022-12-14 DIAGNOSIS — F419 Anxiety disorder, unspecified: Secondary | ICD-10-CM | POA: Diagnosis not present

## 2022-12-14 DIAGNOSIS — F331 Major depressive disorder, recurrent, moderate: Secondary | ICD-10-CM | POA: Diagnosis not present

## 2022-12-14 NOTE — Progress Notes (Signed)
Virtual Visit via Video Note   I connected with Linda [name] on 12/14/22 at  2:00 PM EDT by a video enabled telemedicine application and verified that I am speaking with the correct person using two identifiers.   Location: Patient: Home Provider: Office    I discussed the limitations of evaluation and management by telemedicine and the availability of in person appointments. The patient expressed understanding and agreed to proceed.     THERAPIST PROGRESS NOTE     Session Time: 3:00 PM-3:45 PM   Participation Level: Active   Behavioral Response: Casual and Alert,Depressed   Type of Therapy: Individual Therapy   Treatment Goals addressed: Depression and Anxiety   Interventions: CBT   Summary: Linda [name] a 22 y.o. female who presents with  Recurrent MDD with Anxiety. The OPT therapist worked with the patient for her OPT treatment. The OPT therapist utilized Motivational Interviewing to assist in creating therapeutic repore. The patient in the session was engaged and work in collaboration giving feedback about her triggers and symptoms over the past few weeks. The patient spoke about interactions with  her family and changes since last session she has made to set boundaries that have been helpful. The patient spoke about her goals of getting back into the gym, eating better, and getting her health back to a better place. The patient reviewed negative distorted body images leading to self esteem problems. The OPT therapist utilized Cognitive Behavioral Therapy through cognitive restructuring as well as worked with the patient on coping strategies to assist in management of  body image issues, mood and anxiety.The OPT therapist reviewed as well with the patient basic need areas examining the patients current eating habits, sleep schedule, exercise, and hygiene.   Suicidal/Homicidal: Nowithout intent/plan   Therapist Response:The OPT therapist worked with the patient for the  patients scheduled session. The patient was engaged in her session and gave feedback in relation to triggers, symptoms, and behavior responses over the past few weeks. The OPT therapist worked with the patient utilizing an in session Cognitive Behavioral Therapy exercise. The patient was responsive in the session and verbalized, " I have a negative self image and it comes I think from so long hearing others say negative things about met". The patient spoke about awareness of maintaining boundaries with her family.The OPT therapist worked with the patient providing ongoing psycho-education. The OPT therapist worked with the patient evaluating her use of coping to balance her external stressors. The patient spoke about her goals. The patient spoke about her med management and consideration of trying a anxiety medication though her PCP/ psychiatrist. The OPT therapist will work with the patient in her next scheduled session.     Plan: return in 2/3 weeks    Diagnosis:      Axis I: Recurrent moderate  MDD with anxiety     Axis II: No diagnosis       Collaboration of Care: No additional collaboration for this session.   Patient/Guardian was advised Release of Information must be obtained prior to any record release in order to collaborate their care with an outside provider. Patient/Guardian was advised if they have not already done so to contact the registration department to sign all necessary forms in order for [place] to release information regarding their care.    Consent: Patient/Guardian gives verbal consent for treatment and assignment of benefits for services provided during this visit. Patient/Guardian expressed understanding and agreed to proceed.    I discussed the  assessment and treatment plan with the patient. The patient was provided an opportunity to ask questions and all were answered. The patient agreed with the plan and demonstrated an understanding of the instructions.   The patient was  advised to call back or seek an in-person evaluation if the symptoms worsen or if the condition fails to improve as anticipated.   I provided 45 minutes of non-face-to-face time during this encounter.   Winfred Burn, LCSW   12/14/2022

## 2022-12-21 DIAGNOSIS — E1065 Type 1 diabetes mellitus with hyperglycemia: Secondary | ICD-10-CM | POA: Diagnosis not present

## 2022-12-22 ENCOUNTER — Ambulatory Visit: Payer: Medicaid Other | Admitting: Registered"

## 2023-01-03 ENCOUNTER — Ambulatory Visit (INDEPENDENT_AMBULATORY_CARE_PROVIDER_SITE_OTHER): Payer: Medicaid Other | Admitting: *Deleted

## 2023-01-03 DIAGNOSIS — Z3042 Encounter for surveillance of injectable contraceptive: Secondary | ICD-10-CM | POA: Diagnosis not present

## 2023-01-03 MED ORDER — MEDROXYPROGESTERONE ACETATE 150 MG/ML IM SUSP
150.0000 mg | Freq: Once | INTRAMUSCULAR | Status: AC
  Administered 2023-01-03: 150 mg via INTRAMUSCULAR

## 2023-01-03 NOTE — Progress Notes (Signed)
   NURSE VISIT- INJECTION  SUBJECTIVE:  Linda [name] is a 22 y.o. G30P1001 female here for a Depo Provera for contraception/period management. She is a GYN patient.   OBJECTIVE:  There were no vitals taken for this visit.  Appears well, in no apparent distress  Injection administered in: Right deltoid  Meds ordered this encounter  Medications   medroxyPROGESTERone (DEPO-PROVERA) injection 150 mg    ASSESSMENT: GYN patient Depo Provera for contraception/period management PLAN: Follow-up: in 11-13 weeks for next Depo   [name]  01/03/2023 2:58 PM

## 2023-01-21 DIAGNOSIS — E1065 Type 1 diabetes mellitus with hyperglycemia: Secondary | ICD-10-CM | POA: Diagnosis not present

## 2023-01-24 ENCOUNTER — Ambulatory Visit (INDEPENDENT_AMBULATORY_CARE_PROVIDER_SITE_OTHER): Payer: Medicaid Other | Admitting: Clinical

## 2023-01-24 DIAGNOSIS — F331 Major depressive disorder, recurrent, moderate: Secondary | ICD-10-CM

## 2023-01-24 DIAGNOSIS — F419 Anxiety disorder, unspecified: Secondary | ICD-10-CM

## 2023-01-24 NOTE — Progress Notes (Signed)
Virtual Visit via Video Note   I connected with Linda [name] on 01/24/23 at  3:00 PM EDT by a video enabled telemedicine application and verified that I am speaking with the correct person using two identifiers.   Location: Patient: Home Provider: Office    I discussed the limitations of evaluation and management by telemedicine and the availability of in person appointments. The patient expressed understanding and agreed to proceed.     THERAPIST PROGRESS NOTE     Session Time: 3:00 PM-3:45 PM   Participation Level: Active   Behavioral Response: Casual and Alert,Depressed   Type of Therapy: Individual Therapy   Treatment Goals addressed: Depression and Anxiety   Interventions: CBT   Summary: Linda [name] a 22 y.o. female who presents with  Recurrent MDD with Anxiety. The OPT therapist worked with the patient for her OPT treatment. The OPT therapist utilized Motivational Interviewing to assist in creating therapeutic repore. The patient in the session was engaged and work in collaboration giving feedback about her triggers and symptoms over the past few weeks. The patient spoke about interactions and her support system over the past few weeks. The patient spoke about her goals of improving her body images  that have led to self esteem problems. The OPT therapist utilized Cognitive Behavioral Therapy through cognitive restructuring as well as worked with the patient on coping strategies to assist in management of  body image issues, mood and anxiety.The OPT therapist reviewed as well with the patient basic need areas examining the patients current eating habits, sleep schedule, exercise, and hygiene.   Suicidal/Homicidal: Nowithout intent/plan   Therapist Response:The OPT therapist worked with the patient for the patients scheduled session. The patient was engaged in her session and gave feedback in relation to triggers, symptoms, and behavior responses over the past few  weeks. The OPT therapist worked with the patient utilizing an in session Cognitive Behavioral Therapy exercise. The patient was responsive in the session and verbalized, " I still struggle when other people give me a complement because I've been so conditioned I feel like if someone says something positive I automatically think to myself what do they want from me". The OPT therapist continued work with the patient on managing her automatic negative thoughts.The patient spoke about awareness of maintaining boundaries with her family.The OPT therapist worked with the patient providing ongoing psycho-education. The OPT therapist worked with the patient evaluating her use of coping to balance her external stressors. The OPT therapist will work with the patient in her next scheduled session.     Plan: return in 2/3 weeks    Diagnosis:      Axis I: Recurrent moderate  MDD with anxiety     Axis II: No diagnosis       Collaboration of Care: No additional collaboration for this session.   Patient/Guardian was advised Release of Information must be obtained prior to any record release in order to collaborate their care with an outside provider. Patient/Guardian was advised if they have not already done so to contact the registration department to sign all necessary forms in order for [place] to release information regarding their care.    Consent: Patient/Guardian gives verbal consent for treatment and assignment of benefits for services provided during this visit. Patient/Guardian expressed understanding and agreed to proceed.    I discussed the assessment and treatment plan with the patient. The patient was provided an opportunity to ask questions and all were answered. The patient agreed with  the plan and demonstrated an understanding of the instructions.   The patient was advised to call back or seek an in-person evaluation if the symptoms worsen or if the condition fails to improve as anticipated.   I  provided 45 minutes of non-face-to-face time during this encounter.   Winfred Burn, LCSW   01/24/2023

## 2023-01-26 ENCOUNTER — Ambulatory Visit (INDEPENDENT_AMBULATORY_CARE_PROVIDER_SITE_OTHER): Payer: Medicaid Other | Admitting: Family Medicine

## 2023-01-26 ENCOUNTER — Encounter: Payer: Self-pay | Admitting: Family Medicine

## 2023-01-26 ENCOUNTER — Ambulatory Visit (HOSPITAL_COMMUNITY)
Admission: RE | Admit: 2023-01-26 | Discharge: 2023-01-26 | Disposition: A | Discharge status: Home / Self Care | Payer: Medicaid Other | Source: Ambulatory Visit | Attending: Family Medicine | Admitting: Family Medicine

## 2023-01-26 VITALS — BP 119/82 | HR 84 | Temp 98.3°F | Ht 62.0 in | Wt 207.0 lb

## 2023-01-26 DIAGNOSIS — R1030 Lower abdominal pain, unspecified: Secondary | ICD-10-CM | POA: Diagnosis not present

## 2023-01-26 DIAGNOSIS — Z1331 Encounter for screening for depression: Secondary | ICD-10-CM

## 2023-01-26 LAB — URINALYSIS, ROUTINE W REFLEX MICROSCOPIC
Bilirubin, UA: NEGATIVE
Glucose, UA: NEGATIVE
Ketones, UA: NEGATIVE
Leukocytes,UA: NEGATIVE
Nitrite, UA: NEGATIVE
Protein,UA: NEGATIVE
RBC, UA: NEGATIVE
Specific Gravity, UA: 1.025 (ref 1.005–1.030)
Urobilinogen, Ur: 0.2 mg/dL (ref 0.2–1.0)
pH, UA: 6 (ref 5.0–7.5)

## 2023-01-26 NOTE — Progress Notes (Signed)
Acute Office Visit  Subjective:  Patient ID: Linda [name], female    DOB: [birth year], 22 y.o.   MRN: [id number]  Chief Complaint  Patient presents with   Ovarian Cyst   HPI Patient is in today for continued abdominal pain. She was seen in April, which was the first occurrence of abdominal pain. States that she did not follow up at first because there was increased length of time between episodes of pain. Now, it is more frequent. Endorses pain with movement and palpation. States that it "feels like a ball" of pain, then will "go all across" her stomach to the other side. Also endorses cramping and pressure in lower abdomen. States some times it goes away quickly, sometimes it lasts all day. Has not noticed anything that makes it better. States that she has naproxen. She has tried it a few times, but it does not really help. Continues on depo. Has not had menstrual cycle in over a year. States that she is having regular bowel movements. States that they are "pretty normal". Has one daily. No abdominal surgeries. Endorses nausea and vomiting with the episodes of pain. Does not notice a correlation with food.  Depression/Anxiety  States that she is in therapy and that she has follow up. Denies thoughts of self-harm.   ROS As per HPI  Objective:  BP 119/82   Pulse 84   Temp 98.3 F (36.8 C)   Ht 5\' 2"  (1.575 m)   Wt 207 lb (93.9 kg)   SpO2 100%   BMI 37.86 kg/m   Physical Exam Constitutional:      General: She is awake. She is not in acute distress.    Appearance: Normal appearance. She is well-developed and well-groomed. She is obese. She is not ill-appearing, toxic-appearing or diaphoretic.  Cardiovascular:     Rate and Rhythm: Normal rate.     Pulses: Normal pulses.          Radial pulses are 2+ on the right side and 2+ on the left side.       Posterior tibial pulses are 2+ on the right side and 2+ on the left side.     Heart sounds: Normal heart sounds. No murmur  heard.    No gallop.  Pulmonary:     Effort: Pulmonary effort is normal. No respiratory distress.     Breath sounds: Normal breath sounds. No stridor. No wheezing, rhonchi or rales.  Abdominal:     General: Bowel sounds are normal. There is no distension.     Palpations: Abdomen is soft. There is no mass.     Tenderness: There is abdominal tenderness. There is guarding. There is no right CVA tenderness, left CVA tenderness or rebound.     Hernia: No hernia is present.  Musculoskeletal:     Cervical back: Full passive range of motion without pain and neck supple.     Right lower leg: No edema.     Left lower leg: No edema.  Skin:    General: Skin is warm.     Capillary Refill: Capillary refill takes less than 2 seconds.  Neurological:     General: No focal deficit present.     Mental Status: She is alert, oriented to person, place, and time and easily aroused. Mental status is at baseline.     GCS: GCS eye subscore is 4. GCS verbal subscore is 5. GCS motor subscore is 6.     Motor: No weakness.  Psychiatric:  Attention and Perception: Attention and perception normal.        Mood and Affect: Mood and affect normal.        Speech: Speech normal.        Behavior: Behavior normal. Behavior is cooperative.        Thought Content: Thought content normal. Thought content does not include homicidal or suicidal ideation. Thought content does not include homicidal or suicidal plan.        Cognition and Memory: Cognition and memory normal.        Judgment: Judgment normal.        01/26/2023    8:42 AM 11/05/2022    3:18 PM 10/06/2022    9:18 AM  Depression screen PHQ 2/9  Decreased Interest 3 2   Down, Depressed, Hopeless 2 3   PHQ - 2 Score 5 5   Altered sleeping 3 3   Tired, decreased energy 3 3   Change in appetite 2 2   Feeling bad or failure about yourself  1 1   Trouble concentrating 3 3   Moving slowly or fidgety/restless 0 0   Suicidal thoughts 0 0   PHQ-9 Score 17 17    Difficult doing work/chores Extremely dIfficult Extremely dIfficult      Information is confidential and restricted. Go to Review Flowsheets to unlock data.      01/26/2023    8:42 AM 11/05/2022    3:18 PM 04/02/2022    9:21 AM 01/20/2022    2:40 PM  GAD 7 : Generalized Anxiety Score  Nervous, Anxious, on Edge 3 3  3   Control/stop worrying 3 3  3   Worry too much - different things 3 3  3   Trouble relaxing 3 3  3   Restless 3 3  2   Easily annoyed or irritable 3 3  3   Afraid - awful might happen 3 3  3   Total GAD 7 Score 21 21  20   Anxiety Difficulty Extremely difficult   Extremely difficult     Information is confidential and restricted. Go to Review Flowsheets to unlock data.   Assessment & Plan:  1. Lower abdominal pain Labs and imaging as below. Will communicate results to patient once available. Will await results to determine next steps.  Referral placed as below to gynecology for further evaluation.  Xray not available in office today, Patient sent to [facility] for walk in xray.  - US PELVIC COMPLETE WITH TRANSVAGINAL; Future - Ambulatory referral to Gynecology - Urine Culture - Urinalysis, Routine w reflex microscopic - DG Abd 1 View  2. Encounter for screening for depression Patient declined intervention today. Patient has follow up scheduled with counselor. Safety contract established. Denies Self-harm.   The above assessment and management plan was discussed with the patient. The patient verbalized understanding of and has agreed to the management plan using shared-decision making. Patient is aware to call the clinic if they develop any new symptoms or if symptoms fail to improve or worsen. Patient is aware when to return to the clinic for a follow-up visit. Patient educated on when it is appropriate to go to the emergency department.   Return if symptoms worsen or fail to improve.  Neale Burly, DNP-FNP [facility] [address] [place],  [place] [address] [phone]

## 2023-01-26 NOTE — Progress Notes (Signed)
Negative UA. Will await results of other imaging to determine next steps.

## 2023-01-27 LAB — URINE CULTURE: Organism ID, Bacteria: NO GROWTH

## 2023-02-01 NOTE — Progress Notes (Signed)
Xray consistent with constipation. Patient can continue with evaluation for ovarian cyst if she is concerned. However, I would recommend patient attempt bowel cleanout to see if her symptoms resolve. Orders were placed for transvaginal US and referral to gynecology.   Instructions for Bowel Cleanout  1. Symptoms are consistent with Constipation, likely cause of your General Abdominal Pain / Cramping. 2. Start with Miralax. First dose 68g (4 capfuls) in 32oz water over 1 to 2 hours for clean out. Next day start 17g or 1 capful daily, may adjust dose up or down by half a capful every few days. Recommend to take this medicine daily for next 1-2 weeks, you may need to use it longer if needed. - Goal is to have soft regular bowel movement 1-3x daily, if too runny or diarrhea, then reduce dose of the medicine to every other day.  Improve water intake, hydration will help Also recommend increased vegetables, fruits, fiber intake Can try daily Metamucil or Fiber supplement at pharmacy over the counter  Follow-up if symptoms are not improving with bowel movements, or if pain worsens, develop fevers, nausea, vomiting.  Please schedule a follow-up appointment in 1 month to follow-up Constipation  If you have any other questions or concerns, please feel free to call the clinic to contact me. You may also schedule an earlier appointment if necessary.  However, if your symptoms get significantly worse, please go to the Emergency Department to seek immediate medical attention.

## 2023-02-01 NOTE — Progress Notes (Signed)
Negative urine culture. See Xray results.

## 2023-02-03 ENCOUNTER — Ambulatory Visit (HOSPITAL_COMMUNITY)
Admission: RE | Admit: 2023-02-03 | Discharge: 2023-02-03 | Disposition: A | Discharge status: Home / Self Care | Payer: Medicaid Other | Source: Ambulatory Visit | Attending: Family Medicine | Admitting: Family Medicine

## 2023-02-03 DIAGNOSIS — R1030 Lower abdominal pain, unspecified: Secondary | ICD-10-CM | POA: Insufficient documentation

## 2023-02-03 DIAGNOSIS — N854 Malposition of uterus: Secondary | ICD-10-CM | POA: Diagnosis not present

## 2023-02-03 DIAGNOSIS — R102 Pelvic and perineal pain: Secondary | ICD-10-CM | POA: Diagnosis not present

## 2023-02-04 NOTE — Progress Notes (Signed)
Did not visualize the right ovary. Otherwise normal [place]. Recommend patient complete bowel cleanout and continue to follow up with PCP and OB if symptoms continue.

## 2023-02-08 ENCOUNTER — Ambulatory Visit (INDEPENDENT_AMBULATORY_CARE_PROVIDER_SITE_OTHER): Payer: Medicaid Other | Admitting: Family Medicine

## 2023-02-08 ENCOUNTER — Encounter: Payer: Self-pay | Admitting: Family Medicine

## 2023-02-08 ENCOUNTER — Ambulatory Visit (INDEPENDENT_AMBULATORY_CARE_PROVIDER_SITE_OTHER): Payer: Medicaid Other

## 2023-02-08 VITALS — BP 111/74 | HR 95 | Temp 98.3°F | Ht 62.0 in | Wt 206.0 lb

## 2023-02-08 DIAGNOSIS — Z8269 Family history of other diseases of the musculoskeletal system and connective tissue: Secondary | ICD-10-CM

## 2023-02-08 DIAGNOSIS — E559 Vitamin D deficiency, unspecified: Secondary | ICD-10-CM

## 2023-02-08 DIAGNOSIS — M25552 Pain in left hip: Secondary | ICD-10-CM

## 2023-02-08 DIAGNOSIS — M25551 Pain in right hip: Secondary | ICD-10-CM

## 2023-02-08 DIAGNOSIS — F419 Anxiety disorder, unspecified: Secondary | ICD-10-CM | POA: Diagnosis not present

## 2023-02-08 DIAGNOSIS — F32A Depression, unspecified: Secondary | ICD-10-CM

## 2023-02-08 NOTE — Patient Instructions (Addendum)
You were negative for inflammatory markers back in May but going to grab a few more tests and check hip xrays Schedule OV with Dr Adrian Blackwater to talk about meds.  Given your reports of pain, I wonder if Cymbalta wouldn't be a good fit for you. Resume the Vitamin d. You were scheduled to see nutrition 12/22/22 but it appears you missed that visit.  Call to reschedule.  [facility] at [facility] [address], [place] [address] [phone]

## 2023-02-08 NOTE — Progress Notes (Signed)
Subjective: WU:[id number] up Vit D def PCP: Raliegh Ip, DO YNW:[id number] Linda [name] is a 22 y.o. female presenting to clinic today for:  1.  Vitamin D deficiency Patient reports that she was doing well on vitamin D every 7 days for about 6 weeks but then just suddenly stopped taking it.  She felt better on the vitamin D and is not sure why she stopped it.  She still has several weeks left of the medicine.  2.  Anxiety and depression Patient has seen Dr. Adrian Blackwater x 1.  She had labs obtained as per recommendations which did reveal vitamin D deficiency as above.  She has not seen him again but was instructed to follow-up with him if she decided to pursue pharmacologic intervention.  Her counselor did recommend that she consider medication last visit and she thinks that is being coordinated.  3.  Polyarthralgia  she continues to have polyarthralgia and reports pain specifically in the hip rating to the groin.  She had imaging of the abdomen which was unrevealing except for constipation which is now more regular.  She reports sometimes it hurts getting up from a seated position.   ROS: Per HPI  No Known Allergies Past Medical History:  Diagnosis Date   Allergy    Anxiety    Asthma    Carpal tunnel syndrome    Depression    Diabetes mellitus without complication (HCC) 2014   Type 1   Palpitations    Tachycardia    Tinnitus     Current Outpatient Medications:    Continuous Blood Gluc Sensor (DEXCOM G6 SENSOR) MISC, SMARTSIG:Topical Every 10 Days, Disp: , Rfl:    Continuous Blood Gluc Transmit (DEXCOM G6 TRANSMITTER) MISC, CHANGE every three MONTHS as directed with G6 Sensor, Disp: , Rfl:    glucagon 1 MG injection, Inject 1 mg into the muscle once as needed (for blood sugar)., Disp: , Rfl:    ibuprofen (ADVIL) 600 MG tablet, Take 1 tablet (600 mg total) by mouth every 8 (eight) hours as needed., Disp: 30 tablet, Rfl: 0   insulin aspart (NOVOLOG) 100 UNIT/ML injection, Inject  into the skin., Disp: , Rfl:    medroxyPROGESTERone (DEPO-PROVERA) 150 MG/ML injection, INJECT 1 ML (150 MG TOTAL) INTO THE MUSCLE EVERY 3 (THREE) MONTHS, Disp: 1 mL, Rfl: 4   metFORMIN (GLUCOPHAGE-XR) 500 MG 24 hr tablet, Take 1,000 mg by mouth every morning., Disp: , Rfl:    Vitamin D, Ergocalciferol, (DRISDOL) 1.25 MG (50000 UNIT) CAPS capsule, Take 1 capsule (50,000 Units total) by mouth every 7 (seven) days. X12 weeks.  Then start Vit D OTC 400 IU daily., Disp: 12 capsule, Rfl: 0 Social History   Socioeconomic History   Marital status: Media planner    Spouse name: Not on file   Number of children: Not on file   Years of education: Not on file   Highest education level: 12th grade  Occupational History   Not on file  Tobacco Use   Smoking status: Never    Passive exposure: Yes   Smokeless tobacco: Never  Vaping Use   Vaping status: Every Day  Substance and Sexual Activity   Alcohol use: Yes    Comment: 1 beast or simply lemonade once weekly on the weekend   Drug use: Yes    Types: Marijuana    Comment: THC-A or delta 8 or delta 9 nightly.  Tried edibles twice   Sexual activity: Yes    Birth control/protection: Condom, Injection  Other Topics Concern   Not on file  Social History Narrative   Not on file   Social Determinants of Health   Financial Resource Strain: Low Risk  (11/05/2022)   Overall Financial Resource Strain (CARDIA)    Difficulty of Paying Living Expenses: Not hard at all  Food Insecurity: No Food Insecurity (11/05/2022)   Hunger Vital Sign    Worried About Running Out of Food in the Last Year: Never true    Ran Out of Food in the Last Year: Never true  Transportation Needs: No Transportation Needs (11/05/2022)   PRAPARE - Administrator, Civil Service (Medical): No    Lack of Transportation (Non-Medical): No  Physical Activity: Unknown (11/05/2022)   Exercise Vital Sign    Days of Exercise per Week: 0 days    Minutes of Exercise per Session:  Not on file  Stress: Stress Concern Present (11/05/2022)   [employer] of Occupational Health - Occupational Stress Questionnaire    Feeling of Stress : Very much  Social Connections: Moderately Isolated (11/05/2022)   Social Connection and Isolation Panel [NHANES]    Frequency of Communication with Friends and Family: More than three times a week    Frequency of Social Gatherings with Friends and Family: More than three times a week    Attends Religious Services: Never    Database administrator or Organizations: No    Attends Engineer, structural: Not on file    Marital Status: Living with partner  Intimate Partner Violence: Unknown (10/08/2021)   Received from [employer], [facility]   HITS    Physically Hurt: Not on file    Insult or Talk Down To: Not on file    Threaten Physical Harm: Not on file    Scream or Curse: Not on file   Family History  Problem Relation Age of Onset   Hypertension Mother    Thyroid disease Mother    Asthma Brother    Thyroid disease Brother    COPD Paternal Grandmother    Hypertension Paternal Grandmother    COPD Maternal Grandmother    Lumbar disc disease Maternal Grandmother    Dementia Maternal Grandmother    Heart attack Maternal Grandfather    Emphysema Maternal Grandfather    Ankylosing spondylitis Maternal Grandfather    Dementia Maternal Grandfather     Objective: Office vital signs reviewed. BP 111/74   Pulse 95   Temp 98.3 F (36.8 C)   Ht 5\' 2"  (1.575 m)   Wt 206 lb (93.4 kg)   SpO2 94%   Breastfeeding No   BMI 37.68 kg/m   Physical Examination:  General: Awake, alert, obese, No acute distress MSK: ambulating independently. Normal tone.  Positive FADIR bilaterally.  Negative FABER.   Psych: mood stable, pleasant interactive.     02/08/2023    1:49 PM 01/26/2023    8:42 AM 11/05/2022    3:18 PM  Depression screen PHQ 2/9  Decreased Interest 3 3 2   Down, Depressed, Hopeless 3 2 3   PHQ - 2 Score 6 5 5    Altered sleeping 3 3 3   Tired, decreased energy 3 3 3   Change in appetite 2 2 2   Feeling bad or failure about yourself  2 1 1   Trouble concentrating 0 3 3  Moving slowly or fidgety/restless 0 0 0  Suicidal thoughts 0 0 0  PHQ-9 Score 16 17 17   Difficult doing work/chores Extremely dIfficult Extremely dIfficult Extremely dIfficult  02/08/2023    1:49 PM 01/26/2023    8:42 AM 11/05/2022    3:18 PM 04/02/2022    9:21 AM  GAD 7 : Generalized Anxiety Score  Nervous, Anxious, on Edge 3 3 3 3   Control/stop worrying 3 3 3 3   Worry too much - different things 3 3 3 3   Trouble relaxing 3 3 3 3   Restless 3 3 3 3   Easily annoyed or irritable 3 3 3 3   Afraid - awful might happen 3 3 3 3   Total GAD 7 Score 21 21 21 21   Anxiety Difficulty  Extremely difficult        Assessment/ Plan: 22 y.o. female   Bilateral hip pain - Plan: Arthritis Panel, DG HIPS BILAT WITH PELVIS 3-4 VIEWS  Family history of rheumatism - Plan: Arthritis Panel, DG HIPS BILAT WITH PELVIS 3-4 VIEWS  Vitamin D deficiency  Anxiety and depression  Check plain films.  Look for erosive changes to suggest autoimmune pathology.  She has positive FADIR bilaterally. If negative, will plan for referral for PT.  Check arthritis panel. Previous CRP/ESR/ANA negative.  Check RF  Resume vitamin D  Pt to call nutritionist to set up appt. Apparently, no showed appt.  No orders of the defined types were placed in this encounter.  No orders of the defined types were placed in this encounter.    Raliegh Ip, DO [facility] [phone]

## 2023-02-10 ENCOUNTER — Other Ambulatory Visit: Payer: Self-pay | Admitting: Family Medicine

## 2023-02-10 DIAGNOSIS — M25551 Pain in right hip: Secondary | ICD-10-CM

## 2023-02-15 ENCOUNTER — Ambulatory Visit: Payer: Medicaid Other | Admitting: Adult Health

## 2023-02-21 ENCOUNTER — Ambulatory Visit (HOSPITAL_COMMUNITY): Payer: Medicaid Other | Admitting: Clinical

## 2023-02-21 DIAGNOSIS — F331 Major depressive disorder, recurrent, moderate: Secondary | ICD-10-CM

## 2023-02-21 DIAGNOSIS — E1065 Type 1 diabetes mellitus with hyperglycemia: Secondary | ICD-10-CM | POA: Diagnosis not present

## 2023-02-21 DIAGNOSIS — F419 Anxiety disorder, unspecified: Secondary | ICD-10-CM | POA: Diagnosis not present

## 2023-02-21 NOTE — Progress Notes (Signed)
Virtual Visit via Video Note   I connected with Linda [name] on 02/21/23 at  3:00 PM EDT by a video enabled telemedicine application and verified that I am speaking with the correct person using two identifiers.   Location: Patient: Home Provider: Office    I discussed the limitations of evaluation and management by telemedicine and the availability of in person appointments. The patient expressed understanding and agreed to proceed.     THERAPIST PROGRESS NOTE     Session Time: 3:00 PM-3:55 PM   Participation Level: Active   Behavioral Response: Casual and Alert,Depressed   Type of Therapy: Individual Therapy   Treatment Goals addressed: Depression and Anxiety   Interventions: CBT   Summary: Linda [name] a 22 y.o. female who presents with  Recurrent MDD with Anxiety. The OPT therapist worked with the patient for her OPT treatment. The OPT therapist utilized Motivational Interviewing to assist in creating therapeutic repore. The patient in the session was engaged and work in collaboration giving feedback about her triggers and symptoms over the past few weeks. The patient spoke about interactions and her support system over the past few weeks. The patient spoke about her goals of improving her body images  that have led to self esteem problems. The patient spoke about the vicarious stress of her Father attempting suicide recently. The OPT therapist utilized Cognitive Behavioral Therapy through cognitive restructuring as well as worked with the patient on coping strategies to assist in management of  body image issues, mood and anxiety.The OPT therapist reviewed as well with the patient basic need areas examining the patients current eating habits, sleep schedule, exercise, and hygiene. The OPT therapist worked with the patient on exercise in my control not in my control and with boundary implementation.    Suicidal/Homicidal: Nowithout intent/plan   Therapist Response:The  OPT therapist worked with the patient for the patients scheduled session. The patient was engaged in her session and gave feedback in relation to triggers, symptoms, and behavior responses over the past few weeks. The OPT therapist worked with the patient utilizing an in session Cognitive Behavioral Therapy exercise. The patient was responsive in the session and verbalized, " Things have not been going so well my parents have continued arguing and my Father tired to commit suicide through drinking and taking pain medication and was seen at hospital and then released the next day". The OPT therapist continued work with the patient on managing her automatic negative thoughts.The patient spoke about awareness of maintaining boundaries with her family including her mother and father who frequently visit the patient at her home and end up in verbal altercations inside the patients home and in front of her kids..The OPT therapist worked with the patient providing ongoing psycho-education. The OPT therapist worked with the patient evaluating her use of coping to balance her external stressors. The OPT therapist will work with the patient in her next scheduled session.     Plan: return in 2/3 weeks    Diagnosis:      Axis I: Recurrent moderate  MDD with anxiety     Axis II: No diagnosis       Collaboration of Care: No additional collaboration for this session.   Patient/Guardian was advised Release of Information must be obtained prior to any record release in order to collaborate their care with an outside provider. Patient/Guardian was advised if they have not already done so to contact the registration department to sign all necessary forms in order  for [place] to release information regarding their care.    Consent: Patient/Guardian gives verbal consent for treatment and assignment of benefits for services provided during this visit. Patient/Guardian expressed understanding and agreed to proceed.    I  discussed the assessment and treatment plan with the patient. The patient was provided an opportunity to ask questions and all were answered. The patient agreed with the plan and demonstrated an understanding of the instructions.   The patient was advised to call back or seek an in-person evaluation if the symptoms worsen or if the condition fails to improve as anticipated.   I provided 55 minutes of non-face-to-face time during this encounter.   Winfred Burn, LCSW   02/21/2023

## 2023-02-22 ENCOUNTER — Ambulatory Visit: Payer: Medicaid Other | Admitting: Adult Health

## 2023-02-22 ENCOUNTER — Encounter: Payer: Self-pay | Admitting: Adult Health

## 2023-02-22 VITALS — BP 130/82 | HR 99 | Ht 62.0 in | Wt 207.0 lb

## 2023-02-22 DIAGNOSIS — M5442 Lumbago with sciatica, left side: Secondary | ICD-10-CM | POA: Diagnosis not present

## 2023-02-22 DIAGNOSIS — R102 Pelvic and perineal pain: Secondary | ICD-10-CM

## 2023-02-22 DIAGNOSIS — M5441 Lumbago with sciatica, right side: Secondary | ICD-10-CM

## 2023-02-22 DIAGNOSIS — G8929 Other chronic pain: Secondary | ICD-10-CM

## 2023-02-22 DIAGNOSIS — K5900 Constipation, unspecified: Secondary | ICD-10-CM

## 2023-02-22 NOTE — Progress Notes (Signed)
  Subjective:     Patient ID: Linda [name], female   DOB: 2000/09/06, 22 y.o.   MRN: [id number]  HPI Aleyssa is a 22 year old white female, with DP, G1P1001 in for complaints of pelvic pain, had [place] 02/03/23: IMPRESSION: 1. Normal sonographic appearance of the uterus, endometrium, and left ovary, with nonvisualization of the right ovary. No pelvic or adnexal mass. No free fluid. 2. No other acute abnormality within the pelvis. No findings to explain patient's symptoms.   She says was told could be constipation and has miralax to take, she has low back too and radiates down her legs, R>L. Had negative hip x-rays 02/08/23. The pain has been going on for years she says.   Last pap was negative 07/17/21.  PCP is Dr Nadine Counts. Review of Systems +pelvic pain, felt better when hip popped  +back pain that radiates down legs, R>L,  +constipation too Denies any problems with urination or sex   Reviewed past medical,surgical, social and family history. Reviewed medications and allergies.  Objective:   Physical Exam BP 130/82 (BP Location: Left Arm, Patient Position: Sitting, Cuff Size: Normal)   Pulse 99   Ht 5\' 2"  (1.575 m)   Wt 207 lb (93.9 kg)   Breastfeeding No   BMI 37.86 kg/m     Skin warm and dry. Lungs: clear to ausculation bilaterally. Cardiovascular: regular rate and rhythm. Abdomen is soft and non tender, no HSM, no pelvic pain on palpation, she declined internal exam. No CVAT +discomfort and pain with leg raises and bending.   Upstream - 02/22/23 0944       Pregnancy Intention Screening   Does the patient want to become pregnant in the next year? Ok Either Way    Does the patient's partner want to become pregnant in the next year? Ok Either Way    Would the patient like to discuss contraceptive options today? No      Contraception Wrap Up   Current Method Hormonal Injection    End Method Hormonal Injection    Contraception Counseling Provided Yes              Assessment:     1. Pelvic pain Has pelvic pain across low pelvic area [place] was normal Discussed could be coming from back, felt better when hip popped, will refer for evaluation with orthopedic   2. Chronic bilateral low back pain with bilateral sciatica +pain/discomfort with bending and leg raises Will refer to Orthopedic for evaluation - Ambulatory referral to Orthopedic Surgery  3. Constipation, unspecified constipation type Take Miralax     Plan:     Follow up in 4 weeks

## 2023-03-01 ENCOUNTER — Ambulatory Visit: Payer: Medicaid Other | Admitting: Orthopedic Surgery

## 2023-03-04 ENCOUNTER — Other Ambulatory Visit: Payer: Self-pay | Admitting: Orthopedic Surgery

## 2023-03-04 ENCOUNTER — Encounter: Payer: Self-pay | Admitting: Orthopedic Surgery

## 2023-03-04 ENCOUNTER — Other Ambulatory Visit (INDEPENDENT_AMBULATORY_CARE_PROVIDER_SITE_OTHER): Payer: Medicaid Other

## 2023-03-04 ENCOUNTER — Ambulatory Visit: Payer: Medicaid Other | Admitting: Orthopedic Surgery

## 2023-03-04 VITALS — BP 125/83 | HR 86 | Ht 62.0 in | Wt 206.5 lb

## 2023-03-04 DIAGNOSIS — M25552 Pain in left hip: Secondary | ICD-10-CM | POA: Diagnosis not present

## 2023-03-04 DIAGNOSIS — M545 Low back pain, unspecified: Secondary | ICD-10-CM

## 2023-03-04 DIAGNOSIS — G8929 Other chronic pain: Secondary | ICD-10-CM

## 2023-03-04 DIAGNOSIS — M25551 Pain in right hip: Secondary | ICD-10-CM

## 2023-03-04 DIAGNOSIS — M5440 Lumbago with sciatica, unspecified side: Secondary | ICD-10-CM

## 2023-03-04 NOTE — Patient Instructions (Signed)
Call [employer] let them know you have had xrays and they will send you a replacement device.

## 2023-03-04 NOTE — Progress Notes (Signed)
New Patient Visit  Assessment: Linda [name] is a 22 y.o. female with the following: 1. Chronic midline low back pain with sciatica, sciatica laterality unspecified 2.  Bilateral hip pain  Plan: Linda [name] has chronic low back pain, as well as some pain in the anterior bilateral hips.  No specific injury.  We reviewed radiographs in clinic today.  No concerning features on x-rays.  The description of her pain is consistent with general muscle weakness and fatigue.  We discussed axial back pain in general.  Encouraged her to initiate some exercises specifically for her lower back.  In addition, I encouraged her to increase her aerobic activity.  Tylenol or ibuprofen as needed.  She states that her primary care provider sent in a referral for physical therapy.  I encouraged her to schedule an appointment.  Nothing further is needed at this time.  Follow-up: Return if symptoms worsen or fail to improve.  Subjective:  Chief Complaint  Patient presents with   Back Pain    Has had back pain most of her life. Pain is in the bend of legs near groin area and radiates down both legs.      History of Present Illness: Linda [name] is a 22 y.o. female who has been referred by Cyril Mourning, NP for evaluation of low back pain.  She states she has had low back pain most of her life.  No specific injury.  Her pain has gotten worse since she had a baby almost 2 years ago.  Pain is primarily in the low back, with occasional pains in her hips.  In addition, she does some pain in the anterior aspect of both knees.  No injuries to either knees.  No injuries to her hips.  She does not think medications.  She has not taken Tylenol or ibuprofen.  She denies numbness, tingling.  She states that when she gets up to do some housework, the pain is excruciating after a while.  No physical therapy.  She was evaluated by her primary care doctor, and a referral was sent for therapy, but she has  not been scheduled.  She does not smoke cigarettes, but states that she smokes marijuana.   Review of Systems: No fevers or chills No numbness or tingling No chest pain No shortness of breath No bowel or bladder dysfunction No GI distress No headaches   Medical History:  Past Medical History:  Diagnosis Date   Allergy    Anxiety    Asthma    Carpal tunnel syndrome    Depression    Diabetes mellitus without complication (HCC) 2014   Type 1   Palpitations    Tachycardia    Tinnitus     Past Surgical History:  Procedure Laterality Date   ADENOIDECTOMY     TONSILLECTOMY     WISDOM TOOTH EXTRACTION      Family History  Problem Relation Age of Onset   COPD Paternal Grandmother    Hypertension Paternal Grandmother    COPD Maternal Grandmother    Lumbar disc disease Maternal Grandmother    Dementia Maternal Grandmother    Heart attack Maternal Grandfather    Emphysema Maternal Grandfather    Ankylosing spondylitis Maternal Grandfather    Dementia Maternal Grandfather    Hypertension Mother    Thyroid disease Mother    Asthma Brother    Thyroid disease Brother    Social History   Tobacco Use   Smoking status: Never  Passive exposure: Yes   Smokeless tobacco: Never  Vaping Use   Vaping status: Every Day  Substance Use Topics   Alcohol use: Yes    Comment: not often   Drug use: Yes    Types: Marijuana    Comment: every other day    No Known Allergies  Current Meds  Medication Sig   Continuous Blood Gluc Sensor (DEXCOM G6 SENSOR) MISC SMARTSIG:Topical Every 10 Days   Continuous Blood Gluc Transmit (DEXCOM G6 TRANSMITTER) MISC CHANGE every three MONTHS as directed with G6 Sensor   glucagon 1 MG injection Inject 1 mg into the muscle once as needed (for blood sugar).   ibuprofen (ADVIL) 600 MG tablet Take 1 tablet (600 mg total) by mouth every 8 (eight) hours as needed.   insulin aspart (NOVOLOG) 100 UNIT/ML injection Inject into the skin.    medroxyPROGESTERone (DEPO-PROVERA) 150 MG/ML injection INJECT 1 ML (150 MG TOTAL) INTO THE MUSCLE EVERY 3 (THREE) MONTHS   metFORMIN (GLUCOPHAGE-XR) 500 MG 24 hr tablet Take 1,000 mg by mouth. Takes it seldom   Vitamin D, Ergocalciferol, (DRISDOL) 1.25 MG (50000 UNIT) CAPS capsule Take 1 capsule (50,000 Units total) by mouth every 7 (seven) days. X12 weeks.  Then start Vit D OTC 400 IU daily.    Objective: BP 125/83   Pulse 86   Ht 5\' 2"  (1.575 m)   Wt 206 lb 8 oz (93.7 kg)   BMI 37.77 kg/m   Physical Exam:  General: Alert and oriented. and No acute distress. Gait: Normal gait.  Evaluation of the lower back demonstrates no deformity.  She has mild tenderness palpation.  Pain is primarily in the midline area.  Negative bilateral straight leg raise.  Sensation is intact throughout bilateral lower extremities.  Mild discomfort in the anterior aspect of her knees.  She has good lower body strength.  Toes warm and well-perfused.  Equal patellar tendon reflexes bilaterally.  IMAGING: I personally ordered and reviewed the following images   Standing lumbar spine x-rays were obtained in clinic today.  No acute injuries are noted.  Well-maintained disc height.  No evidence of anterolisthesis.  No osteophytes.  No bony lesions.  Impression: Negative lumbar spine x-rays   X-rays of bilateral hips were previously obtained.  Well-maintained joint space in bilateral hips.  Small pincer lesions bilaterally.  Some evidence of impingement, with some mild head neck offset.    New Medications:  No orders of the defined types were placed in this encounter.     Oliver Barre, MD  03/04/2023 10:44 AM

## 2023-03-17 ENCOUNTER — Telehealth (INDEPENDENT_AMBULATORY_CARE_PROVIDER_SITE_OTHER): Payer: Medicaid Other | Admitting: Family Medicine

## 2023-03-17 ENCOUNTER — Encounter: Payer: Self-pay | Admitting: Family Medicine

## 2023-03-17 DIAGNOSIS — R109 Unspecified abdominal pain: Secondary | ICD-10-CM

## 2023-03-17 DIAGNOSIS — R112 Nausea with vomiting, unspecified: Secondary | ICD-10-CM

## 2023-03-17 DIAGNOSIS — E1065 Type 1 diabetes mellitus with hyperglycemia: Secondary | ICD-10-CM | POA: Diagnosis not present

## 2023-03-17 NOTE — Progress Notes (Signed)
Virtual Visit via video Note   Due to COVID-19 pandemic this visit was conducted virtually. This visit type was conducted due to national recommendations for restrictions regarding the COVID-19 Pandemic (e.g. social distancing, sheltering in place) in an effort to limit this patient's exposure and mitigate transmission in our community. All issues noted in this document were discussed and addressed.  A physical exam was not performed with this format.  I connected with  Linda [name]  on 03/17/23 at 1309 by video and verified that I am speaking with the correct person using two identifiers. Linda [name] is currently located at home and no one is currently with her during the visit. The provider, Gabriel Earing, FNP is located in their office at time of visit.  I discussed the limitations, risks, security and privacy concerns of performing an evaluation and management service by video  and the availability of in person appointments. I also discussed with the patient that there may be a patient responsible charge related to this service. The patient expressed understanding and agreed to proceed.  CC: vomiting  History and Present Illness:  Linda [name] reports abdominal cramping that started last visit. She also has had vomiting since last night. She reports frequent dry heaving as well. She has been unable to keep down water. If she drinks any fluids, she vomits within a few minutes. She has been feeling dizzy, lightheaded, and weak. She has a hx of T1DM and reports that she feels like she did when she has been in DKA in the past. She also reports body aches and chills. She denies, fever, diarrhea. She has had some ongoing cough and congestion. She is concerned about possible food poisoning as some chili that she ate last light tasted off. Her fiance also ate the chili and has had some abdominal pain today.    ROS As per HPI.   Observations/Objective: Alert and oriented.  Respirations unlabored. No cyanosis. Non toxic appearing. Normal mood and behavior.     Assessment and Plan: Lula was seen today for emesis.  Diagnoses and all orders for this visit:  Nausea and vomiting, unspecified vomiting type  Abdominal cramping  Type 1 diabetes mellitus with hyperglycemia (HCC)   Vomiting and is unable to tolerate PO fluids. Feeling weak and lightheaded. Discussed need for urgent evaluation in the ER for IV fluids for dehydration as well as the possibility of DKA and the potential to develop DKA in the setting of dehydration. She will have her fiance take her to the ER. Discussed to call EMS if her fiance is able to take her.   Follow Up Instructions: Go to ER now.     I discussed the assessment and treatment plan with the patient. The patient was provided an opportunity to ask questions and all were answered. The patient agreed with the plan and demonstrated an understanding of the instructions.   The patient was advised to call back or seek an in-person evaluation if the symptoms worsen or if the condition fails to improve as anticipated.  The above assessment and management plan was discussed with the patient. The patient verbalized understanding of and has agreed to the management plan. Patient is aware to call the clinic if symptoms persist or worsen. Patient is aware when to return to the clinic for a follow-up visit. Patient educated on when it is appropriate to go to the emergency department.   Time call ended: 13:36  I provided 6 minutes of face-to-face time  during this encounter.    Gabriel Earing, FNP

## 2023-03-22 ENCOUNTER — Encounter: Payer: Self-pay | Admitting: Adult Health

## 2023-03-22 ENCOUNTER — Ambulatory Visit: Payer: Medicaid Other | Admitting: Adult Health

## 2023-03-22 VITALS — BP 127/86 | HR 107 | Ht 62.0 in | Wt 206.0 lb

## 2023-03-22 DIAGNOSIS — M5442 Lumbago with sciatica, left side: Secondary | ICD-10-CM

## 2023-03-22 DIAGNOSIS — G8929 Other chronic pain: Secondary | ICD-10-CM

## 2023-03-22 DIAGNOSIS — M5441 Lumbago with sciatica, right side: Secondary | ICD-10-CM | POA: Diagnosis not present

## 2023-03-22 NOTE — Progress Notes (Signed)
Subjective:     Patient ID: Linda [name], female   DOB: September 06, 2000, 22 y.o.   MRN: [id number]  HPI Linda [name] is a 22 year old white female, with DP, G1P1001 back in follow up on chronic low back pain and hip pain. She saw  Dr Dallas Schimke 03/04/23 he told her may need PT. She has not had that started yet, will call him or PCP. She says arms burn now if holding child for more than 10 minutes.     Component Value Date/Time   DIAGPAP  07/17/2021 0937    - Negative for intraepithelial lesion or malignancy (NILM)   ADEQPAP  07/17/2021 0937    Satisfactory for evaluation; transformation zone component ABSENT.   PCP is Dr Nadine Counts   Review of Systems Pain low back and hips Arms burn  Reviewed past medical,surgical, social and family history. Reviewed medications and allergies.     Objective:   Physical Exam BP 127/86 (BP Location: Left Arm, Patient Position: Sitting, Cuff Size: Normal)   Pulse (!) 107   Ht 5\' 2"  (1.575 m)   Wt 206 lb (93.4 kg)   Breastfeeding No   BMI 37.68 kg/m     Skin warm and dry.  Lungs: clear to ausculation bilaterally. Cardiovascular: regular rate and rhythm.  Fall risk is low  Upstream - 03/22/23 1337       Pregnancy Intention Screening   Does the patient want to become pregnant in the next year? No    Does the patient's partner want to become pregnant in the next year? No    Would the patient like to discuss contraceptive options today? No      Contraception Wrap Up   Current Method Hormonal Injection    End Method Hormonal Injection    Contraception Counseling Provided Yes             Assessment:     1. Chronic bilateral low back pain with bilateral sciatica Still has low back and hip pain She will call PCP or Dr Dallas Schimke for PT referral     Plan:     Return 03/28/23 for depo

## 2023-03-24 DIAGNOSIS — E1065 Type 1 diabetes mellitus with hyperglycemia: Secondary | ICD-10-CM | POA: Diagnosis not present

## 2023-03-28 ENCOUNTER — Ambulatory Visit: Payer: Medicaid Other

## 2023-03-29 ENCOUNTER — Ambulatory Visit (INDEPENDENT_AMBULATORY_CARE_PROVIDER_SITE_OTHER): Payer: Medicaid Other | Admitting: Clinical

## 2023-03-29 DIAGNOSIS — F419 Anxiety disorder, unspecified: Secondary | ICD-10-CM

## 2023-03-29 DIAGNOSIS — F331 Major depressive disorder, recurrent, moderate: Secondary | ICD-10-CM | POA: Diagnosis not present

## 2023-03-29 NOTE — Progress Notes (Signed)
Virtual Visit via Video Note   I connected with Linda [name] on 03/29/23 at  3:00 PM EDT by a video enabled telemedicine application and verified that I am speaking with the correct person using two identifiers.   Location: Patient: Home Provider: Office    I discussed the limitations of evaluation and management by telemedicine and the availability of in person appointments. The patient expressed understanding and agreed to proceed.     THERAPIST PROGRESS NOTE     Session Time: 3:00 PM-3:45 PM   Participation Level: Active   Behavioral Response: Casual and Alert,Depressed   Type of Therapy: Individual Therapy   Treatment Goals addressed: Depression and Anxiety   Interventions: CBT   Summary: Linda [name] a 22 y.o. female who presents with  Recurrent MDD with Anxiety. The OPT therapist worked with the patient for her OPT treatment. The OPT therapist utilized Motivational Interviewing to assist in creating therapeutic repore. The patient in the session was engaged and work in collaboration giving feedback about her triggers and symptoms over the past few weeks. The patient spoke about interactions and her support system over the past few weeks. The patient spoke  vhanges in setting boundaries with her family members and the success of this and the change it has allowed for the patient to have time for her self. The patient spoke about her goals of improving her body images  that have led to self esteem problems . The OPT therapist utilized Cognitive Behavioral Therapy through cognitive restructuring as well as worked with the patient on coping strategies to assist in management of  body image issues, mood and anxiety. Additionally the OPT therapist worked with the patient on allowing positive messages to be received , having positivity in her environment, and utilizing positive affirmations to assist in re-hardwiring her cognitive processes.The OPT therapist reviewed as well  with the patient basic need areas examining the patients current eating habits, sleep schedule, exercise, and hygiene.   Suicidal/Homicidal: Nowithout intent/plan   Therapist Response:The OPT therapist worked with the patient for the patients scheduled session. The patient was engaged in her session and gave feedback in relation to triggers, symptoms, and behavior responses over the past few weeks. The OPT therapist worked with the patient utilizing an in session Cognitive Behavioral Therapy exercise. The patient was responsive in the session and verbalized, " I think dressing more colorful, allowing complements, and using the postive affirmations can help". The OPT therapist continued work with the patient on managing her automatic negative thoughts.The patient spoke about willingness to be aware of automatic negative thoughts and to challenge them.The OPT therapist worked with the patient providing ongoing psycho-education. The OPT therapist worked with the patient evaluating her use of coping to balance her external stressors. The OPT therapist will work with the patient in her next scheduled session.     Plan: return in 2/3 weeks    Diagnosis:      Axis I: Recurrent moderate  MDD with anxiety     Axis II: No diagnosis       Collaboration of Care: No additional collaboration for this session.   Patient/Guardian was advised Release of Information must be obtained prior to any record release in order to collaborate their care with an outside provider. Patient/Guardian was advised if they have not already done so to contact the registration department to sign all necessary forms in order for [place] to release information regarding their care.    Consent: Patient/Guardian gives verbal  consent for treatment and assignment of benefits for services provided during this visit. Patient/Guardian expressed understanding and agreed to proceed.    I discussed the assessment and treatment plan with the patient.  The patient was provided an opportunity to ask questions and all were answered. The patient agreed with the plan and demonstrated an understanding of the instructions.   The patient was advised to call back or seek an in-person evaluation if the symptoms worsen or if the condition fails to improve as anticipated.   I provided 45 minutes of non-face-to-face time during this encounter.   Winfred Burn, LCSW   03/29/2023

## 2023-03-30 ENCOUNTER — Ambulatory Visit: Payer: Medicaid Other

## 2023-03-30 DIAGNOSIS — Z3042 Encounter for surveillance of injectable contraceptive: Secondary | ICD-10-CM | POA: Diagnosis not present

## 2023-03-30 MED ORDER — MEDROXYPROGESTERONE ACETATE 150 MG/ML IM SUSP
150.0000 mg | Freq: Once | INTRAMUSCULAR | Status: AC
  Administered 2023-03-30: 150 mg via INTRAMUSCULAR

## 2023-03-30 NOTE — Progress Notes (Signed)
   NURSE VISIT- INJECTION  SUBJECTIVE:  Linda [name] is a 22 y.o. G82P1001 female here for a Depo Provera for contraception/period management. She is a GYN patient.   OBJECTIVE:  There were no vitals taken for this visit.  Appears well, in no apparent distress  Injection administered in: Left deltoid  No orders of the defined types were placed in this encounter.   ASSESSMENT: GYN patient Depo Provera for contraception/period management PLAN: Follow-up: in 11-13 weeks for next Depo   [name]  03/30/2023 11:16 AM

## 2023-04-04 ENCOUNTER — Ambulatory Visit: Payer: Medicaid Other | Admitting: Family Medicine

## 2023-04-12 ENCOUNTER — Ambulatory Visit (INDEPENDENT_AMBULATORY_CARE_PROVIDER_SITE_OTHER): Payer: Medicaid Other | Admitting: Clinical

## 2023-04-12 DIAGNOSIS — F331 Major depressive disorder, recurrent, moderate: Secondary | ICD-10-CM | POA: Diagnosis not present

## 2023-04-12 DIAGNOSIS — F419 Anxiety disorder, unspecified: Secondary | ICD-10-CM

## 2023-04-12 NOTE — Progress Notes (Signed)
Virtual Visit via Video Note   I connected with Linda [name] on 04/12/23 at  2:00 PM EDT by a video enabled telemedicine application and verified that I am speaking with the correct person using two identifiers.   Location: Patient: Home Provider: Office    I discussed the limitations of evaluation and management by telemedicine and the availability of in person appointments. The patient expressed understanding and agreed to proceed.     THERAPIST PROGRESS NOTE     Session Time: 2:00 PM-2:55 PM   Participation Level: Active   Behavioral Response: Casual and Alert,Depressed   Type of Therapy: Individual Therapy   Treatment Goals addressed: Depression and Anxiety   Interventions: CBT   Summary: Linda [name] a 22 y.o. female who presents with  Recurrent MDD with Anxiety. The OPT therapist worked with the patient for her OPT treatment. The OPT therapist utilized Motivational Interviewing to assist in creating therapeutic repore. The patient in the session was engaged and work in collaboration giving feedback about her triggers and symptoms over the past few weeks. The patient spoke about interactions and her support system over the past few weeks. The patient spoke  about her ongoing work in making changes in setting boundaries with her family members. The patient spoke about her goals of improving her body images  that have led to self esteem problems . The OPT therapist utilized Cognitive Behavioral Therapy through cognitive restructuring as well as worked with the patient on coping strategies to assist in management of  body image issues, mood and anxiety. Additionally the OPT therapist worked with the patient on allowing positive messages to be received and did a in session exercise to reinforce , having positivity in her environment, and utilizing positive affirmations to assist in re-hardwiring her cognitive processes.The OPT therapist reviewed as well with the patient basic  need areas examining the patients current eating habits, sleep schedule, exercise, and hygiene.   Suicidal/Homicidal: Nowithout intent/plan   Therapist Response:The OPT therapist worked with the patient for the patients scheduled session. The patient was engaged in her session and gave feedback in relation to triggers, symptoms, and behavior responses over the past few weeks. The OPT therapist worked with the patient utilizing an in session Cognitive Behavioral Therapy exercise. The patient was responsive in the session and verbalized, " I think I am not filtering te negative thoughts well and I start to do the opposite of challenge negative thought and I start to compile them". The OPT therapist continued work with the patient on managing her automatic negative thoughts.The patient spoke about willingness to be aware of automatic negative thoughts and to challenge them and worked completing a in session exercise around identifying, challenging , and filtering out negative not fact based thought.The OPT therapist worked with the patient providing ongoing psycho-education. The OPT therapist worked with the patient evaluating her use of coping to balance her external stressors. The OPT therapist will work with the patient in her next scheduled session.     Plan: return in 2/3 weeks    Diagnosis:      Axis I: Recurrent moderate  MDD with anxiety     Axis II: No diagnosis       Collaboration of Care: No additional collaboration for this session.   Patient/Guardian was advised Release of Information must be obtained prior to any record release in order to collaborate their care with an outside provider. Patient/Guardian was advised if they have not already done so to  contact the registration department to sign all necessary forms in order for [place] to release information regarding their care.    Consent: Patient/Guardian gives verbal consent for treatment and assignment of benefits for services provided  during this visit. Patient/Guardian expressed understanding and agreed to proceed.    I discussed the assessment and treatment plan with the patient. The patient was provided an opportunity to ask questions and all were answered. The patient agreed with the plan and demonstrated an understanding of the instructions.   The patient was advised to call back or seek an in-person evaluation if the symptoms worsen or if the condition fails to improve as anticipated.   I provided 55 minutes of non-face-to-face time during this encounter.   Winfred Burn, LCSW   04/12/2023

## 2023-04-19 DIAGNOSIS — E1065 Type 1 diabetes mellitus with hyperglycemia: Secondary | ICD-10-CM | POA: Diagnosis not present

## 2023-05-03 ENCOUNTER — Ambulatory Visit (HOSPITAL_COMMUNITY): Payer: Medicaid Other | Admitting: Clinical

## 2023-05-03 DIAGNOSIS — F331 Major depressive disorder, recurrent, moderate: Secondary | ICD-10-CM

## 2023-05-03 DIAGNOSIS — F419 Anxiety disorder, unspecified: Secondary | ICD-10-CM | POA: Diagnosis not present

## 2023-05-03 NOTE — Progress Notes (Signed)
Virtual Visit via Video Note   I connected with Linda [name] on 05/03/23 at  2:00 PM EDT by a video enabled telemedicine application and verified that I am speaking with the correct person using two identifiers.   Location: Patient: Home Provider: Office    I discussed the limitations of evaluation and management by telemedicine and the availability of in person appointments. The patient expressed understanding and agreed to proceed.     THERAPIST PROGRESS NOTE     Session Time: 2:00 PM-2:45 PM   Participation Level: Active   Behavioral Response: Casual and Alert,Depressed   Type of Therapy: Individual Therapy   Treatment Goals addressed: Depression and Anxiety   Interventions: CBT   Summary: Linda [name] a 22 y.o. female who presents with  Recurrent MDD with Anxiety. The OPT therapist worked with the patient for her OPT treatment. The OPT therapist utilized Motivational Interviewing to assist in creating therapeutic repore. The patient in the session was engaged and work in collaboration giving feedback about her triggers and symptoms over the past few weeks. The patient spoke about interactions and her support system over the past few weeks. The patient spoke  about her experiences with being in a friends wedding and her recent birthday turning 82 . The patient spoke about her ongoing work to manage boundaries with her family members. The patient spoke about her realization that she may need to add med therapy to help manage her anxiety/ The patient overviewed her plans for upcoming Halloween holiday plans. The patient spoke about looking forward to go vote for the upcoming presidential election. The OPT therapist utilized Cognitive Behavioral Therapy through cognitive restructuring as well as worked with the patient on coping strategies to assist in management of  body image issues, mood and anxiety. Additionally the OPT therapist worked with the patient on allowing  positive messages to be received and did a in session exercise to reinforce , having positivity in her environment, and utilizing positive affirmations to assist in re-hardwiring her cognitive processes. The OPT therapist overviewed triggers to the patients anxiety and promoted the patient having a med management evaluation. The OPT therapist reviewed as well with the patient basic need areas examining the patients current eating habits, sleep schedule, exercise, and hygiene.   Suicidal/Homicidal: Nowithout intent/plan   Therapist Response:The OPT therapist worked with the patient for the patients scheduled session. The patient was engaged in her session and gave feedback in relation to triggers, symptoms, and behavior responses over the past few weeks. The OPT therapist worked with the patient utilizing an in session Cognitive Behavioral Therapy exercise. The patient was responsive in the session and verbalized, " My PCP Is really helpful and I am going to try to call and get in with them to see about adding medication". The OPT therapist continued work with the patient on managing her automatic negative thoughts.The patient spoke about willingness to be aware of automatic negative thoughts and to challenge them and worked completing a in session exercise around identifying, challenging , and filtering out negative not fact based thought.The OPT therapist worked with the patient providing ongoing psycho-education. The OPT therapist worked with the patient evaluating her use of coping to balance her external stressors. The OPT therapist will work with the patient in her next scheduled session.     Plan: return in 2/3 weeks    Diagnosis:      Axis I: Recurrent moderate  MDD with anxiety     Axis II:  No diagnosis       Collaboration of Care: No additional collaboration for this session.   Patient/Guardian was advised Release of Information must be obtained prior to any record release in order to  collaborate their care with an outside provider. Patient/Guardian was advised if they have not already done so to contact the registration department to sign all necessary forms in order for [place] to release information regarding their care.    Consent: Patient/Guardian gives verbal consent for treatment and assignment of benefits for services provided during this visit. Patient/Guardian expressed understanding and agreed to proceed.    I discussed the assessment and treatment plan with the patient. The patient was provided an opportunity to ask questions and all were answered. The patient agreed with the plan and demonstrated an understanding of the instructions.   The patient was advised to call back or seek an in-person evaluation if the symptoms worsen or if the condition fails to improve as anticipated.   I provided 45 minutes of non-face-to-face time during this encounter.   Winfred Burn, LCSW   05/03/2023

## 2023-05-12 ENCOUNTER — Ambulatory Visit: Payer: Medicaid Other | Admitting: Obstetrics & Gynecology

## 2023-05-12 ENCOUNTER — Encounter: Payer: Self-pay | Admitting: Obstetrics & Gynecology

## 2023-05-12 VITALS — BP 135/81 | HR 84 | Ht 62.0 in | Wt 200.6 lb

## 2023-05-12 DIAGNOSIS — Z9641 Presence of insulin pump (external) (internal): Secondary | ICD-10-CM

## 2023-05-12 DIAGNOSIS — Z3009 Encounter for other general counseling and advice on contraception: Secondary | ICD-10-CM | POA: Diagnosis not present

## 2023-05-12 DIAGNOSIS — E108 Type 1 diabetes mellitus with unspecified complications: Secondary | ICD-10-CM | POA: Diagnosis not present

## 2023-05-12 DIAGNOSIS — E66812 Obesity, class 2: Secondary | ICD-10-CM | POA: Diagnosis not present

## 2023-05-12 NOTE — Addendum Note (Signed)
Addended by: Sharon Seller on: 05/12/2023 02:19 PM   Modules accepted: Orders

## 2023-05-12 NOTE — Progress Notes (Signed)
   GYN VISIT Patient name: Linda [name] MRN [id number]  Date of birth: [birth year] Chief Complaint:   Contraception (Wants BTL)  History of Present Illness:   Linda [name] is a 22 y.o. G31P1001 female being seen today for contraceptive management.  Currently on Depot- does not have a period and overall is not having issue with this form of contraception.  Previously on the pill and noted issues with compliance  At this time, she has done a lot of self reflection and thought about contraceptive options.  She never wants another pregnancy and desires permanent sterilization.  She notes that between her diabetes and other medical comorbidities, she does not think pregnancy is wise for her now or in the future  Denies vaginal discharge, itching or irritation.  Denies pelvic or abdominal pain.  She reports no acute GYN concerns  No LMP recorded. Patient has had an injection.    Review of Systems:   Pertinent items are noted in HPI Denies fever/chills, dizziness, headaches, visual disturbances, fatigue, shortness of breath, chest pain, abdominal pain, vomiting, no problems with periods, bowel movements, urination, or intercourse unless otherwise stated above.  Pertinent History Reviewed:   Past Surgical History:  Procedure Laterality Date   ADENOIDECTOMY     TONSILLECTOMY     WISDOM TOOTH EXTRACTION      Past Medical History:  Diagnosis Date   Allergy    Anxiety    Asthma    Carpal tunnel syndrome    Depression    Diabetes mellitus without complication (HCC) 2014   Type 1   Palpitations    Tachycardia    Tinnitus    Reviewed problem list, medications and allergies. Physical Assessment:   Vitals:   05/12/23 1339  BP: 135/81  Pulse: 84  Weight: 200 lb 9.6 oz (91 kg)  Height: 5\' 2"  (1.575 m)  Body mass index is 36.69 kg/m.       Physical Examination:   General appearance: alert, well appearing, and in no distress  Psych: mood appropriate, normal  affect  Skin: warm & dry   Cardiovascular: normal heart rate noted  Respiratory: normal respiratory effort, no distress  Abdomen: obese, soft, non-tender, no rebound, no guarding  Pelvic: examination not indicated  Extremities: no edema   Chaperone: N/A    Assessment & Plan:  1) Contraception, sterilization  Patient desires permanent sterilization.  Other reversible forms of contraception were discussed with patient; she declines all other modalities. Risks of procedure discussed with patient including but not limited to: risk of regret, permanence of method, bleeding, infection, and potential injury to surrounding organs.  Discussed that especially in setting of salpingectomy, risk of ectopic pregnancy low, less than 1%.  Also discussed possibility of post-tubal pain syndrome. Patient verbalized understanding of these risks and wants to proceed with sterilization.  Written informed consent obtained.   []  plan to schedule 30 days from today  -for now continue with Depot No orders of the defined types were placed in this encounter.   Return for TBD.   Myna Hidalgo, DO Attending Obstetrician & Gynecologist, [facility] for [employer], [facility]

## 2023-05-18 ENCOUNTER — Encounter: Payer: Self-pay | Admitting: Obstetrics & Gynecology

## 2023-05-19 DIAGNOSIS — E1065 Type 1 diabetes mellitus with hyperglycemia: Secondary | ICD-10-CM | POA: Diagnosis not present

## 2023-05-31 ENCOUNTER — Ambulatory Visit (HOSPITAL_COMMUNITY): Payer: Medicaid Other | Admitting: Clinical

## 2023-05-31 DIAGNOSIS — F331 Major depressive disorder, recurrent, moderate: Secondary | ICD-10-CM | POA: Diagnosis not present

## 2023-05-31 DIAGNOSIS — F419 Anxiety disorder, unspecified: Secondary | ICD-10-CM

## 2023-05-31 NOTE — Progress Notes (Signed)
Virtual Visit via Video Note   I connected with Linda [name] on 05/31/23 at  2:00 PM EDT by a video enabled telemedicine application and verified that I am speaking with the correct person using two identifiers.   Location: Patient: Home Provider: Office    I discussed the limitations of evaluation and management by telemedicine and the availability of in person appointments. The patient expressed understanding and agreed to proceed.     THERAPIST PROGRESS NOTE     Session Time: 2:00 PM-2:45 PM   Participation Level: Active   Behavioral Response: Casual and Alert,Depressed   Type of Therapy: Individual Therapy   Treatment Goals addressed: Depression and Anxiety   Interventions: CBT   Summary: Linda [name] a 22 y.o. female who presents with  Recurrent MDD with Anxiety. The OPT therapist worked with the patient for her OPT treatment. The OPT therapist utilized Motivational Interviewing to assist in creating therapeutic repore. The patient in the session was engaged and work in collaboration giving feedback about her triggers and symptoms over the past few weeks. The patient spoke about interactions and her support system over the past few weeks. The patient spoke  about her experiences interacting with her parents. The patient spoke about her ongoing work to manage boundaries with her family members. The patient spoke about her realization that she needs to not enable emotional manipulation. The patient overviewed her plans for upcoming Thanksgiving holiday, a family upcoming birthday for her daughter, and upcoming surgery in December. The OPT therapist utilized Cognitive Behavioral Therapy through cognitive restructuring as well as worked with the patient on coping strategies to assist in management of  body image issues, mood and anxiety. Additionally the OPT therapist worked with the patient on allowing positive messages to be received and did a in session exercise to  reinforce , having positivity in her environment, and utilizing positive affirmations to assist in re-hardwiring her cognitive processes. The OPT therapist reviewed as well with the patient basic need areas examining the patients current eating habits, sleep schedule, exercise, and hygiene.   Suicidal/Homicidal: Nowithout intent/plan   Therapist Response:The OPT therapist worked with the patient for the patients scheduled session. The patient was engaged in her session and gave feedback in relation to triggers, symptoms, and behavior responses over the past few weeks. The OPT therapist worked with the patient utilizing an in session Cognitive Behavioral Therapy exercise. The patient was responsive in the session and verbalized, " I dont know why I set the boundaries and then I let my guard down and they manipulate me and then I fall right back into being overwhelmed running all over the place for them ". The OPT therapist continued work with the patient on setting boundaries with her parents. The OPT therapist worked with the patient on managing her automatic negative thoughts.The patient spoke about willingness to be aware of automatic negative thoughts and to challenge them and worked completing a in session exercise around identifying, challenging , and filtering out negative not fact based thoughts about herself.The OPT therapist worked with the patient providing ongoing psycho-education. The OPT therapist worked with the patient evaluating her use of coping to balance her external stressors. The OPT therapist will work with the patient in her next scheduled session.     Plan: return in 2/3 weeks    Diagnosis:      Axis I: Recurrent moderate  MDD with anxiety     Axis II: No diagnosis  Collaboration of Care: No additional collaboration for this session.   Patient/Guardian was advised Release of Information must be obtained prior to any record release in order to collaborate their care with  an outside provider. Patient/Guardian was advised if they have not already done so to contact the registration department to sign all necessary forms in order for [place] to release information regarding their care.    Consent: Patient/Guardian gives verbal consent for treatment and assignment of benefits for services provided during this visit. Patient/Guardian expressed understanding and agreed to proceed.    I discussed the assessment and treatment plan with the patient. The patient was provided an opportunity to ask questions and all were answered. The patient agreed with the plan and demonstrated an understanding of the instructions.   The patient was advised to call back or seek an in-person evaluation if the symptoms worsen or if the condition fails to improve as anticipated.   I provided 45 minutes of non-face-to-face time during this encounter.   Winfred Burn, LCSW   05/31/2023

## 2023-06-09 NOTE — Patient Instructions (Signed)
Linda [name]  06/09/2023     @PREFPERIOPPHARMACY @   Your procedure is scheduled on  06/15/2023.   Report to [facility] at  [phone]  A.M.   Call this number if you have problems the morning of surgery:  [phone]  If you experience any cold or flu symptoms such as cough, fever, chills, shortness of breath, etc. between now and your scheduled surgery, please notify us at the above number.   Remember:       Take only basal amount of insulin on the night before and the day of your procedure.    Do not eat after midnight.    You may drink clear liquids until 0430 am on 06/15/2023.    Clear liquids allowed are:                    Water, Juice (No red color; non-citric and without pulp; diabetics please choose diet or no sugar options), Carbonated beverages (diabetics please choose diet or no sugar options), Clear Tea (No creamer, milk, or cream, including half & half and powdered creamer), Black Coffee Only (No creamer, milk or cream, including half & half and powdered creamer), and Clear Sports drink (No red color; diabetics please choose diet or no sugar options)    Take these medicines the morning of surgery with A SIP OF WATER                                             None.    Do not wear jewelry, make-up or nail polish, including gel polish,  artificial nails, or any other type of covering on natural nails (fingers and  toes).  Do not wear lotions, powders, or perfumes, or deodorant.  Do not shave 48 hours prior to surgery.  Men may shave face and neck.  Do not bring valuables to the hospital.  [facility] is not responsible for any belongings or valuables.  Contacts, dentures or bridgework may not be worn into surgery.  Leave your suitcase in the car.  After surgery it may be brought to your room.  For patients admitted to the hospital, discharge time will be determined by your treatment team.  Patients discharged the day of surgery will not be  allowed to drive home and must have someone with them for 24 hours.    Special instructions:   DO NOT smoke tobacco or vape for 24 hours before your procedure.  Please read over the following fact sheets that you were given. Coughing and Deep Breathing, Surgical Site Infection Prevention, Anesthesia Post-op Instructions, and Care and Recovery After Surgery       Salpingectomy, Care After The following information offers guidance on how to care for yourself after your procedure. Your health care provider may also give you more specific instructions. If you have problems or questions, contact your health care provider. What can I expect after the procedure? After the procedure, it is common to have: Pain in your abdomen. Light vaginal bleeding (spotting) for a few days. Tiredness. Your recovery time will depend on which method was used for your surgery. Follow these instructions at home: Medicines Take over-the-counter and prescription medicines only as told by your health care provider. Ask your health care provider if the medicine prescribed to you: Requires you to avoid driving or using  machinery. Can cause constipation. You may need to take actions to prevent or treat constipation, such as: Drink enough fluid to keep your urine pale yellow. Take over-the-counter or prescription medicines. Eat foods that are high in fiber, such as beans, whole grains, and fresh fruits and vegetables. Limit foods that are high in fat and processed sugars, such as fried or sweet foods. Incision care  Follow instructions from your health care provider about how to take care of your incision or incisions. Make sure you: Wash your hands with soap and water for at least 20 seconds before and after you change your bandage (dressing). If soap and water are not available, use hand sanitizer. Change or remove your dressing as told by your health care provider. Leave stitches (sutures), skin glue, staples, or  adhesive strips in place. These skin closures may need to stay in place for 2 weeks or longer. If adhesive strip edges start to loosen and curl up, you may trim the loose edges. Do not remove adhesive strips completely unless your health care provider tells you to do that. Keep your dressing clean and dry. Check your incision area every day for signs of infection. Check for: Redness, swelling, or pain that gets worse. Fluid or blood. Warmth. Pus or a bad smell. Activity Rest as told by your health care provider. Avoid sitting for a long time without moving. Get up to take short walks every 1-2 hours. This is important to improve blood flow and breathing. Ask for help if you feel weak or unsteady. Return to your normal activities as told by your health care provider. Ask your health care provider what activities are safe for you. Do not drive until your health care provider says that it is safe. Do not lift anything that is heavier than 10 lb (4.5 kg), or the limit that you are told, until your health care provider says that it is safe. This may last for 2-6 weeks depending on your surgery. Do not douche, use tampons, or have sex until your health care provider approves. General instructions Do not use any products that contain nicotine or tobacco. These products include cigarettes, chewing tobacco, and vaping devices, such as e-cigarettes. These can delay healing after surgery. If you need help quitting, ask your health care provider. Wear compression stockings as told by your health care provider. These stockings help to prevent blood clots and reduce swelling in your legs. Do not take baths, swim, or use a hot tub until your health care provider approves. You may take showers. Keep all follow-up visits. This is important. Contact a health care provider if: You have pain when you urinate. You have redness, swelling, or more pain around an incision or an incision feels warm to the touch. You  have pus, fluid, blood, or a bad smell coming from an incision or an incision starts to open. You have a fever. You have abdominal pain that gets worse or does not get better with medicine. You have a rash. You feel light-headed, have nausea and vomiting, or both. Get help right away if: You have pain in your chest or leg. You develop shortness of breath. You faint. You have increased or heavy vaginal bleeding, such as soaking a sanitary napkin in an hour. These symptoms may represent a serious problem that is an emergency. Do not wait to see if the symptoms will go away. Get medical help right away. Call your local emergency services (911 in the [place] Do not drive  yourself to the hospital. Summary After the procedure, it is common to feel tired, have pain in your abdomen, and have light vaginal bleeding for a few days. Follow instructions from your health care provider about how to take care of your incision or incisions. Return to your normal activities as told by your health care provider. Ask your health care provider what activities are safe for you. Do not douche, use tampons, or have sex until your health care provider approves. Keep all follow-up visits. This is important. This information is not intended to replace advice given to you by your health care provider. Make sure you discuss any questions you have with your health care provider. Document Revised: 05/12/2020 Document Reviewed: 05/13/2020 Elsevier Patient Education  2024 [employer] General Anesthesia, Adult, Care After The following information offers guidance on how to care for yourself after your procedure. Your health care provider may also give you more specific instructions. If you have problems or questions, contact your health care provider. What can I expect after the procedure? After the procedure, it is common for people to: Have pain or discomfort at the IV site. Have nausea or vomiting. Have a sore throat  or hoarseness. Have trouble concentrating. Feel cold or chills. Feel weak, sleepy, or tired (fatigue). Have soreness and body aches. These can affect parts of the body that were not involved in surgery. Follow these instructions at home: For the time period you were told by your health care provider:  Rest. Do not participate in activities where you could fall or become injured. Do not drive or use machinery. Do not drink alcohol. Do not take sleeping pills or medicines that cause drowsiness. Do not make important decisions or sign legal documents. Do not take care of children on your own. General instructions Drink enough fluid to keep your urine pale yellow. If you have sleep apnea, surgery and certain medicines can increase your risk for breathing problems. Follow instructions from your health care provider about wearing your sleep device: Anytime you are sleeping, including during daytime naps. While taking prescription pain medicines, sleeping medicines, or medicines that make you drowsy. Return to your normal activities as told by your health care provider. Ask your health care provider what activities are safe for you. Take over-the-counter and prescription medicines only as told by your health care provider. Do not use any products that contain nicotine or tobacco. These products include cigarettes, chewing tobacco, and vaping devices, such as e-cigarettes. These can delay incision healing after surgery. If you need help quitting, ask your health care provider. Contact a health care provider if: You have nausea or vomiting that does not get better with medicine. You vomit every time you eat or drink. You have pain that does not get better with medicine. You cannot urinate or have bloody urine. You develop a skin rash. You have a fever. Get help right away if: You have trouble breathing. You have chest pain. You vomit blood. These symptoms may be an emergency. Get help right  away. Call 911. Do not wait to see if the symptoms will go away. Do not drive yourself to the hospital. Summary After the procedure, it is common to have a sore throat, hoarseness, nausea, vomiting, or to feel weak, sleepy, or fatigue. For the time period you were told by your health care provider, do not drive or use machinery. Get help right away if you have difficulty breathing, have chest pain, or vomit blood. These symptoms may be  an emergency. This information is not intended to replace advice given to you by your health care provider. Make sure you discuss any questions you have with your health care provider. Document Revised: 09/18/2021 Document Reviewed: 09/18/2021 Elsevier Patient Education  2024 [employer] How to Use Chlorhexidine Before Surgery Chlorhexidine gluconate (CHG) is a germ-killing (antiseptic) solution that is used to clean the skin. It can get rid of the bacteria that normally live on the skin and can keep them away for about 24 hours. To clean your skin with CHG, you may be given: A CHG solution to use in the shower or as part of a sponge bath. A prepackaged cloth that contains CHG. Cleaning your skin with CHG may help lower the risk for infection: While you are staying in the intensive care unit of the hospital. If you have a vascular access, such as a central line, to provide short-term or long-term access to your veins. If you have a catheter to drain urine from your bladder. If you are on a ventilator. A ventilator is a machine that helps you breathe by moving air in and out of your lungs. After surgery. What are the risks? Risks of using CHG include: A skin reaction. Hearing loss, if CHG gets in your ears and you have a perforated eardrum. Eye injury, if CHG gets in your eyes and is not rinsed out. The CHG product catching fire. Make sure that you avoid smoking and flames after applying CHG to your skin. Do not use CHG: If you have a chlorhexidine  allergy or have previously reacted to chlorhexidine. On babies younger than 65 months of age. How to use CHG solution Use CHG only as told by your health care provider, and follow the instructions on the label. Use the full amount of CHG as directed. Usually, this is one bottle. During a shower Follow these steps when using CHG solution during a shower (unless your health care provider gives you different instructions): Start the shower. Use your normal soap and shampoo to wash your face and hair. Turn off the shower or move out of the shower stream. Pour the CHG onto a clean washcloth. Do not use any type of brush or rough-edged sponge. Starting at your neck, lather your body down to your toes. Make sure you follow these instructions: If you will be having surgery, pay special attention to the part of your body where you will be having surgery. Scrub this area for at least 1 minute. Do not use CHG on your head or face. If the solution gets into your ears or eyes, rinse them well with water. Avoid your genital area. Avoid any areas of skin that have broken skin, cuts, or scrapes. Scrub your back and under your arms. Make sure to wash skin folds. Let the lather sit on your skin for 1-2 minutes or as long as told by your health care provider. Thoroughly rinse your entire body in the shower. Make sure that all body creases and crevices are rinsed well. Dry off with a clean towel. Do not put any substances on your body afterward--such as powder, lotion, or perfume--unless you are told to do so by your health care provider. Only use lotions that are recommended by the manufacturer. Put on clean clothes or pajamas. If it is the night before your surgery, sleep in clean sheets.  During a sponge bath Follow these steps when using CHG solution during a sponge bath (unless your health care provider gives you  different instructions): Use your normal soap and shampoo to wash your face and hair. Pour the  CHG onto a clean washcloth. Starting at your neck, lather your body down to your toes. Make sure you follow these instructions: If you will be having surgery, pay special attention to the part of your body where you will be having surgery. Scrub this area for at least 1 minute. Do not use CHG on your head or face. If the solution gets into your ears or eyes, rinse them well with water. Avoid your genital area. Avoid any areas of skin that have broken skin, cuts, or scrapes. Scrub your back and under your arms. Make sure to wash skin folds. Let the lather sit on your skin for 1-2 minutes or as long as told by your health care provider. Using a different clean, wet washcloth, thoroughly rinse your entire body. Make sure that all body creases and crevices are rinsed well. Dry off with a clean towel. Do not put any substances on your body afterward--such as powder, lotion, or perfume--unless you are told to do so by your health care provider. Only use lotions that are recommended by the manufacturer. Put on clean clothes or pajamas. If it is the night before your surgery, sleep in clean sheets. How to use CHG prepackaged cloths Only use CHG cloths as told by your health care provider, and follow the instructions on the label. Use the CHG cloth on clean, dry skin. Do not use the CHG cloth on your head or face unless your health care provider tells you to. When washing with the CHG cloth: Avoid your genital area. Avoid any areas of skin that have broken skin, cuts, or scrapes. Before surgery Follow these steps when using a CHG cloth to clean before surgery (unless your health care provider gives you different instructions): Using the CHG cloth, vigorously scrub the part of your body where you will be having surgery. Scrub using a back-and-forth motion for 3 minutes. The area on your body should be completely wet with CHG when you are done scrubbing. Do not rinse. Discard the cloth and let the area  air-dry. Do not put any substances on the area afterward, such as powder, lotion, or perfume. Put on clean clothes or pajamas. If it is the night before your surgery, sleep in clean sheets.  For general bathing Follow these steps when using CHG cloths for general bathing (unless your health care provider gives you different instructions). Use a separate CHG cloth for each area of your body. Make sure you wash between any folds of skin and between your fingers and toes. Wash your body in the following order, switching to a new cloth after each step: The front of your neck, shoulders, and chest. Both of your arms, under your arms, and your hands. Your stomach and groin area, avoiding the genitals. Your right leg and foot. Your left leg and foot. The back of your neck, your back, and your buttocks. Do not rinse. Discard the cloth and let the area air-dry. Do not put any substances on your body afterward--such as powder, lotion, or perfume--unless you are told to do so by your health care provider. Only use lotions that are recommended by the manufacturer. Put on clean clothes or pajamas. Contact a health care provider if: Your skin gets irritated after scrubbing. You have questions about using your solution or cloth. You swallow any chlorhexidine. Call your local poison control center ([phone] in the [place] Get help  right away if: Your eyes itch badly, or they become very red or swollen. Your skin itches badly and is red or swollen. Your hearing changes. You have trouble seeing. You have swelling or tingling in your mouth or throat. You have trouble breathing. These symptoms may represent a serious problem that is an emergency. Do not wait to see if the symptoms will go away. Get medical help right away. Call your local emergency services (911 in the [place] Do not drive yourself to the hospital. Summary Chlorhexidine gluconate (CHG) is a germ-killing (antiseptic) solution that is used  to clean the skin. Cleaning your skin with CHG may help to lower your risk for infection. You may be given CHG to use for bathing. It may be in a bottle or in a prepackaged cloth to use on your skin. Carefully follow your health care provider's instructions and the instructions on the product label. Do not use CHG if you have a chlorhexidine allergy. Contact your health care provider if your skin gets irritated after scrubbing. This information is not intended to replace advice given to you by your health care provider. Make sure you discuss any questions you have with your health care provider. Document Revised: 10/19/2021 Document Reviewed: 09/01/2020 Elsevier Patient Education  2023 [employer].

## 2023-06-13 ENCOUNTER — Encounter (HOSPITAL_COMMUNITY)
Admission: RE | Admit: 2023-06-13 | Discharge: 2023-06-13 | Disposition: A | Discharge status: Home / Self Care | Payer: Medicaid Other | Source: Ambulatory Visit | Attending: Obstetrics & Gynecology | Admitting: Obstetrics & Gynecology

## 2023-06-13 ENCOUNTER — Encounter (HOSPITAL_COMMUNITY): Payer: Self-pay

## 2023-06-13 VITALS — BP 131/83 | HR 97 | Temp 97.8°F | Resp 16 | Ht 62.0 in | Wt 200.6 lb

## 2023-06-13 DIAGNOSIS — E109 Type 1 diabetes mellitus without complications: Secondary | ICD-10-CM | POA: Diagnosis not present

## 2023-06-13 DIAGNOSIS — Z302 Encounter for sterilization: Secondary | ICD-10-CM

## 2023-06-13 DIAGNOSIS — Z01812 Encounter for preprocedural laboratory examination: Secondary | ICD-10-CM | POA: Diagnosis not present

## 2023-06-13 DIAGNOSIS — Z01818 Encounter for other preprocedural examination: Secondary | ICD-10-CM

## 2023-06-13 HISTORY — DX: Gastro-esophageal reflux disease without esophagitis: K21.9

## 2023-06-13 HISTORY — DX: Enthesopathy, unspecified: M77.9

## 2023-06-13 HISTORY — DX: Nausea with vomiting, unspecified: R11.2

## 2023-06-13 HISTORY — DX: Other specified postprocedural states: Z98.890

## 2023-06-13 LAB — CBC
HCT: 42.7 % (ref 36.0–46.0)
Hemoglobin: 14.2 g/dL (ref 12.0–15.0)
MCH: 30.8 pg (ref 26.0–34.0)
MCHC: 33.3 g/dL (ref 30.0–36.0)
MCV: 92.6 fL (ref 80.0–100.0)
Platelets: 273 10*3/uL (ref 150–400)
RBC: 4.61 MIL/uL (ref 3.87–5.11)
RDW: 12 % (ref 11.5–15.5)
WBC: 6.8 10*3/uL (ref 4.0–10.5)
nRBC: 0 % (ref 0.0–0.2)

## 2023-06-13 LAB — TYPE AND SCREEN
ABO/RH(D): A POS
Antibody Screen: NEGATIVE

## 2023-06-13 LAB — BASIC METABOLIC PANEL
Anion gap: 10 (ref 5–15)
BUN: 11 mg/dL (ref 6–20)
CO2: 22 mmol/L (ref 22–32)
Calcium: 9.4 mg/dL (ref 8.9–10.3)
Chloride: 105 mmol/L (ref 98–111)
Creatinine, Ser: 0.91 mg/dL (ref 0.44–1.00)
GFR, Estimated: >60 mL/min (ref >60)
Glucose, Bld: 190 mg/dL — ABNORMAL HIGH (ref 70–99)
Potassium: 4.1 mmol/L (ref 3.5–5.1)
Sodium: 137 mmol/L (ref 135–145)

## 2023-06-13 LAB — HEMOGLOBIN A1C
Hgb A1c MFr Bld: 7.7 % — ABNORMAL HIGH (ref 4.8–5.6)
Mean Plasma Glucose: 174.29 mg/dL

## 2023-06-13 LAB — PREGNANCY, URINE: Preg Test, Ur: NEGATIVE

## 2023-06-13 NOTE — H&P (Signed)
[facility]ulty [facility] Obstetrics and Gynecology Attending History and Physical  Linda [name] is a 22 y.o. G1P1001 who presents for scheduled laparoscopic bilateral salpingectomy  In review, she has completed childbearing and desires permanent sterilization.  She is currently on Depot and does not have a period.  She notes that between her diabetes and other medical comorbidities, she does not think another pregnancy is wise for her now or in the future.  Denies any abnormal vaginal discharge, fevers, chills, sweats, dysuria, nausea, vomiting, other GI or GU symptoms or other general symptoms.  Past Medical History:  Diagnosis Date   Allergy    Anxiety    Asthma    Carpal tunnel syndrome    Depression    Diabetes mellitus without complication (HCC) 2014   Type 1   GERD (gastroesophageal reflux disease)    Palpitations    PONV (postoperative nausea and vomiting)    Tachycardia    Tendonitis    Tinnitus    Past Surgical History:  Procedure Laterality Date   ADENOIDECTOMY     TONSILLECTOMY     WISDOM TOOTH EXTRACTION     OB History  Gravida Para Term Preterm AB Living  1 1 1  0 0 1  SAB IAB Ectopic Multiple Live Births  0 0 0 0 1    # Outcome Date GA Lbr Len/2nd Weight Sex Type Anes PTL Lv  1 Term 05/25/21 [redacted]w[redacted]d 23:19 / 03:39 3230 g F Vag-Spont EPI  LIV  Patient denies any other pertinent gynecologic issues.  No current facility-administered medications on file prior to encounter.   Current Outpatient Medications on File Prior to Encounter  Medication Sig Dispense Refill   cholecalciferol (VITAMIN D3) 25 MCG (1000 UNIT) tablet Take 1,000 Units by mouth 4 (four) times a week.     glucagon 1 MG injection Inject 1 mg into the muscle once as needed (for blood sugar).     insulin aspart (NOVOLOG) 100 UNIT/ML injection Inject 300 Units into the skin See admin instructions. Pt uses 300 units via dexcom every 3 days     medroxyPROGESTERone (DEPO-PROVERA) 150 MG/ML injection  INJECT 1 ML (150 MG TOTAL) INTO THE MUSCLE EVERY 3 (THREE) MONTHS 1 mL 4   Continuous Blood Gluc Sensor (DEXCOM G6 SENSOR) MISC SMARTSIG:Topical Every 10 Days     Continuous Blood Gluc Transmit (DEXCOM G6 TRANSMITTER) MISC CHANGE every three MONTHS as directed with G6 Sensor     Vitamin D, Ergocalciferol, (DRISDOL) 1.25 MG (50000 UNIT) CAPS capsule Take 1 capsule (50,000 Units total) by mouth every 7 (seven) days. X12 weeks.  Then start Vit D OTC 400 IU daily. (Patient not taking: Reported on 06/07/2023) 12 capsule 0   No Known Allergies  Social History:   reports that she has never smoked. She has been exposed to tobacco smoke. She has never used smokeless tobacco. She reports current alcohol use. She reports current drug use. Drug: Marijuana. Family History  Problem Relation Age of Onset   COPD Paternal Grandmother    Hypertension Paternal Grandmother    COPD Maternal Grandmother    Lumbar disc disease Maternal Grandmother    Dementia Maternal Grandmother    Heart attack Maternal Grandfather    Emphysema Maternal Grandfather    Ankylosing spondylitis Maternal Grandfather    Dementia Maternal Grandfather    Hypertension Mother    Thyroid disease Mother    Asthma Brother    Thyroid disease Brother     Review of Systems: Pertinent items noted  in HPI and remainder of comprehensive ROS otherwise negative.  PHYSICAL EXAM: Blood pressure (!) 133/96, pulse 92, temperature 98.9 F (37.2 C), temperature source Oral, resp. rate 12, height 5\' 2"  (1.575 m), weight 91 kg, SpO2 98%, not currently breastfeeding. CONSTITUTIONAL: Well-developed, well-nourished female in no acute distress.  SKIN: Skin is warm and dry. No rash noted. Not diaphoretic. No erythema. No pallor. NEUROLOGIC: Alert and oriented to person, place, and time. Normal reflexes, muscle tone coordination. No cranial nerve deficit noted. PSYCHIATRIC: Normal mood and affect. Normal behavior. Normal judgment and thought  content. CARDIOVASCULAR: Normal heart rate noted, regular rhythm RESPIRATORY: Effort and breath sounds normal, no problems with respiration noted ABDOMEN: Soft, nontender, nondistended. PELVIC: deferred MUSCULOSKELETAL: no calf tenderness bilaterally EXT: no edema bilaterally, normal pulses  Labs: Results for orders placed or performed during the hospital encounter of 06/15/23 (from the past 336 hour(s))  Glucose, capillary   Collection Time: 06/15/23  6:58 AM  Result Value Ref Range   Glucose-Capillary 120 (H) 70 - 99 mg/dL  Results for orders placed or performed during the hospital encounter of 06/13/23 (from the past 336 hour(s))  Basic metabolic panel   Collection Time: 06/13/23  8:42 AM  Result Value Ref Range   Sodium 137 135 - 145 mmol/L   Potassium 4.1 3.5 - 5.1 mmol/L   Chloride 105 98 - 111 mmol/L   CO2 22 22 - 32 mmol/L   Glucose, Bld 190 (H) 70 - 99 mg/dL   BUN 11 6 - 20 mg/dL   Creatinine, Ser 7.42 0.44 - 1.00 mg/dL   Calcium 9.4 8.9 - 59.5 mg/dL   GFR, Estimated >63 >87 mL/min   Anion gap 10 5 - 15  Hemoglobin A1c   Collection Time: 06/13/23  8:42 AM  Result Value Ref Range   Hgb A1c MFr Bld 7.7 (H) 4.8 - 5.6 %   Mean Plasma Glucose 174.29 mg/dL  CBC   Collection Time: 06/13/23  8:42 AM  Result Value Ref Range   WBC 6.8 4.0 - 10.5 K/uL   RBC 4.61 3.87 - 5.11 MIL/uL   Hemoglobin 14.2 12.0 - 15.0 g/dL   HCT 56.4 33.2 - 95.1 %   MCV 92.6 80.0 - 100.0 fL   MCH 30.8 26.0 - 34.0 pg   MCHC 33.3 30.0 - 36.0 g/dL   RDW 88.4 16.6 - 06.3 %   Platelets 273 150 - 400 K/uL   nRBC 0.0 0.0 - 0.2 %  Pregnancy, urine   Collection Time: 06/13/23  8:42 AM  Result Value Ref Range   Preg Test, Ur NEGATIVE NEGATIVE  Type and screen   Collection Time: 06/13/23  8:42 AM  Result Value Ref Range   ABO/RH(D) A POS    Antibody Screen NEG    Sample Expiration      06/27/2023,2359 Performed at [facility], [address] [place], [place] [address]     Imaging Studies: [place]  02/2023: 6.1 x 3 x 3 cm uterus volume 25 mL.  No discrete fibroids or myometrial abnormalities noted.  Normal endometrium.  Normal left ovary, right ovary not seen.  Assessment: Desires sterilization  Plan: Laparoscopic bilateral salpingectomy -Toradol IV -NPO -LR @ 125cc/hr -SCDs to OR -Patient has been counseled on the permanence of this procedure.  Alternative long-term options have been reviewed with patient and she does desire to proceed with permanent sterilization.  Risk/benefits and alternatives reviewed with the patient including but not limited to risk of bleeding, infection and injury  to surrounding organs requiring further surgical intervention.  Questions and concerns were addressed and pt desires to proceed  Myna Hidalgo, DO Attending Obstetrician & Gynecologist, [facility] for [facility], [facility]

## 2023-06-14 ENCOUNTER — Ambulatory Visit (HOSPITAL_COMMUNITY): Payer: Medicaid Other | Admitting: Clinical

## 2023-06-14 DIAGNOSIS — F419 Anxiety disorder, unspecified: Secondary | ICD-10-CM

## 2023-06-14 DIAGNOSIS — F331 Major depressive disorder, recurrent, moderate: Secondary | ICD-10-CM

## 2023-06-14 NOTE — Progress Notes (Signed)
Virtual Visit via Video Note   I connected with [name] on 06/14/23 at  2:00 PM EDT by a video enabled telemedicine application and verified that I am speaking with the correct person using two identifiers.   Location: Patient: Home Provider: Office    I discussed the limitations of evaluation and management by telemedicine and the availability of in person appointments. The patient expressed understanding and agreed to proceed.     THERAPIST PROGRESS NOTE     Session Time: 2:00 PM-2:45 PM   Participation Level: Active   Behavioral Response: Casual and Alert,Depressed   Type of Therapy: Individual Therapy   Treatment Goals addressed: Depression and Anxiety   Interventions: CBT   Summary: Linda [name] a 22 y.o. female who presents with  Recurrent MDD with Anxiety. The OPT therapist worked with the patient for her OPT treatment. The OPT therapist utilized Motivational Interviewing to assist in creating therapeutic repore. The patient in the session was engaged and work in collaboration giving feedback about her triggers and symptoms over the past few weeks. The patient spoke about interactions and her support system over the past few weeks. The patient spoke  about her experiences interacting with her parents. The patient spoke about her ongoing work to manage boundaries with her family members. The patient spoke about her upcoming surgery tomorrow, recovery plan, and upcoming Christmas holiday. The OPT therapist utilized Cognitive Behavioral Therapy through cognitive restructuring as well as worked with the patient on coping strategies to assist in management of  body image issues, mood and anxiety. Additionally the OPT therapist worked with the patient on allowing positive messages to be received and did a in session exercise to reinforce , having positivity in her environment, and utilizing positive affirmations to assist in re-hardwiring her cognitive processes. The OPT  therapist reviewed as well with the patient basic need areas examining the patients current eating habits, sleep schedule, exercise, and hygiene.   Suicidal/Homicidal: Nowithout intent/plan   Therapist Response:The OPT therapist worked with the patient for the patients scheduled session. The patient was engaged in her session and gave feedback in relation to triggers, symptoms, and behavior responses over the past few weeks. The OPT therapist worked with the patient utilizing an in session Cognitive Behavioral Therapy exercise. The patient was responsive in the session and verbalized, " I have done good with managing my family interactions and walking away from situations when I need to calm down ". The OPT therapist continued work with the patient on setting boundaries with her parents. The OPT therapist worked with the patient on managing her automatic negative thoughts.The patient spoke about willingness to be aware of automatic negative thoughts and to challenge them and worked completing a in session exercise around identifying, challenging , and filtering out negative not fact based thoughts about herself.The OPT therapist worked with the patient providing ongoing psycho-education. The OPT therapist worked with the patient evaluating her use of coping to balance her external stressors.  The patient spoke about her upcoming surgery ,recovery plan, and plans for the upcoming Christmas holiday.The OPT therapist will work with the patient in her next scheduled session.     Plan: return in 2/3 weeks    Diagnosis:      Axis I: Recurrent moderate  MDD with anxiety     Axis II: No diagnosis       Collaboration of Care: No additional collaboration for this session.   Patient/Guardian was advised Release of Information must be  obtained prior to any record release in order to collaborate their care with an outside provider. Patient/Guardian was advised if they have not already done so to contact the  registration department to sign all necessary forms in order for [place] to release information regarding their care.    Consent: Patient/Guardian gives verbal consent for treatment and assignment of benefits for services provided during this visit. Patient/Guardian expressed understanding and agreed to proceed.    I discussed the assessment and treatment plan with the patient. The patient was provided an opportunity to ask questions and all were answered. The patient agreed with the plan and demonstrated an understanding of the instructions.   The patient was advised to call back or seek an in-person evaluation if the symptoms worsen or if the condition fails to improve as anticipated.   I provided 45 minutes of non-face-to-face time during this encounter.   Linda Burn, LCSW   06/14/2023

## 2023-06-15 ENCOUNTER — Encounter (HOSPITAL_COMMUNITY)
Admission: RE | Disposition: A | Discharge status: Home / Self Care | Payer: Self-pay | Source: Ambulatory Visit | Attending: Obstetrics & Gynecology

## 2023-06-15 ENCOUNTER — Ambulatory Visit (HOSPITAL_COMMUNITY): Payer: Self-pay | Admitting: Certified Registered"

## 2023-06-15 ENCOUNTER — Ambulatory Visit (HOSPITAL_BASED_OUTPATIENT_CLINIC_OR_DEPARTMENT_OTHER): Payer: Medicaid Other | Admitting: Certified Registered"

## 2023-06-15 ENCOUNTER — Ambulatory Visit (HOSPITAL_COMMUNITY)
Admission: RE | Admit: 2023-06-15 | Discharge: 2023-06-15 | Disposition: A | Discharge status: Home / Self Care | Payer: Medicaid Other | Source: Ambulatory Visit | Attending: Obstetrics & Gynecology | Admitting: Obstetrics & Gynecology

## 2023-06-15 ENCOUNTER — Encounter (HOSPITAL_COMMUNITY): Payer: Self-pay | Admitting: Obstetrics & Gynecology

## 2023-06-15 DIAGNOSIS — E119 Type 2 diabetes mellitus without complications: Secondary | ICD-10-CM | POA: Insufficient documentation

## 2023-06-15 DIAGNOSIS — J45909 Unspecified asthma, uncomplicated: Secondary | ICD-10-CM | POA: Insufficient documentation

## 2023-06-15 DIAGNOSIS — F32A Depression, unspecified: Secondary | ICD-10-CM | POA: Diagnosis not present

## 2023-06-15 DIAGNOSIS — F419 Anxiety disorder, unspecified: Secondary | ICD-10-CM | POA: Insufficient documentation

## 2023-06-15 DIAGNOSIS — I10 Essential (primary) hypertension: Secondary | ICD-10-CM | POA: Diagnosis not present

## 2023-06-15 DIAGNOSIS — Z3009 Encounter for other general counseling and advice on contraception: Secondary | ICD-10-CM | POA: Diagnosis not present

## 2023-06-15 DIAGNOSIS — Z302 Encounter for sterilization: Secondary | ICD-10-CM

## 2023-06-15 DIAGNOSIS — K219 Gastro-esophageal reflux disease without esophagitis: Secondary | ICD-10-CM | POA: Insufficient documentation

## 2023-06-15 DIAGNOSIS — Z01818 Encounter for other preprocedural examination: Secondary | ICD-10-CM

## 2023-06-15 DIAGNOSIS — Z793 Long term (current) use of hormonal contraceptives: Secondary | ICD-10-CM | POA: Diagnosis not present

## 2023-06-15 DIAGNOSIS — Z79899 Other long term (current) drug therapy: Secondary | ICD-10-CM | POA: Insufficient documentation

## 2023-06-15 HISTORY — PX: LAPAROSCOPIC BILATERAL SALPINGECTOMY: SHX5889

## 2023-06-15 HISTORY — DX: Encounter for sterilization: Z30.2

## 2023-06-15 LAB — GLUCOSE, CAPILLARY
Glucose-Capillary: 120 mg/dL — ABNORMAL HIGH (ref 70–99)
Glucose-Capillary: 171 mg/dL — ABNORMAL HIGH (ref 70–99)

## 2023-06-15 SURGERY — SALPINGECTOMY, BILATERAL, LAPAROSCOPIC
Anesthesia: General | Site: Abdomen | Laterality: Bilateral

## 2023-06-15 MED ORDER — IBUPROFEN 600 MG PO TABS: 600.0000 mg | ORAL_TABLET | Freq: Four times a day (QID) | ORAL | 0 refills | Status: DC | PRN

## 2023-06-15 MED ORDER — DOCUSATE SODIUM 100 MG PO CAPS: 100.0000 mg | ORAL_CAPSULE | Freq: Two times a day (BID) | ORAL | 0 refills | Status: AC

## 2023-06-15 MED ORDER — PROPOFOL 10 MG/ML IV BOLUS
INTRAVENOUS | Status: DC | PRN
  Administered 2023-06-15: 100 mg via INTRAVENOUS

## 2023-06-15 MED ORDER — OXYCODONE HCL 5 MG PO TABS
5.0000 mg | ORAL_TABLET | Freq: Once | ORAL | Status: AC | PRN
  Administered 2023-06-15: 5 mg via ORAL
  Filled 2023-06-15: qty 1

## 2023-06-15 MED ORDER — SUGAMMADEX SODIUM 200 MG/2ML IV SOLN
INTRAVENOUS | Status: DC | PRN
  Administered 2023-06-15: 182 mg via INTRAVENOUS

## 2023-06-15 MED ORDER — LACTATED RINGERS IV SOLN: INTRAVENOUS | Status: DC | PRN

## 2023-06-15 MED ORDER — LIDOCAINE HCL (CARDIAC) PF 100 MG/5ML IV SOSY
PREFILLED_SYRINGE | INTRAVENOUS | Status: DC | PRN
  Administered 2023-06-15: 60 mg via INTRATRACHEAL

## 2023-06-15 MED ORDER — BUPIVACAINE HCL (PF) 0.25 % IJ SOLN
INTRAMUSCULAR | Status: AC
  Filled 2023-06-15: qty 30

## 2023-06-15 MED ORDER — LACTATED RINGERS IV SOLN
INTRAVENOUS | Status: DC
Start: 2023-06-15 — End: 2023-06-15

## 2023-06-15 MED ORDER — MIDAZOLAM HCL 2 MG/2ML IJ SOLN
INTRAMUSCULAR | Status: AC
  Filled 2023-06-15: qty 2

## 2023-06-15 MED ORDER — FENTANYL CITRATE (PF) 250 MCG/5ML IJ SOLN
INTRAMUSCULAR | Status: DC | PRN
  Administered 2023-06-15: 50 ug via INTRAVENOUS
  Administered 2023-06-15: 150 ug via INTRAVENOUS
  Administered 2023-06-15: 50 ug via INTRAVENOUS

## 2023-06-15 MED ORDER — KETOROLAC TROMETHAMINE 30 MG/ML IJ SOLN
30.0000 mg | INTRAMUSCULAR | Status: AC
  Administered 2023-06-15: 30 mg via INTRAVENOUS
  Filled 2023-06-15: qty 1

## 2023-06-15 MED ORDER — OXYCODONE HCL 5 MG PO TABS: 5.0000 mg | ORAL_TABLET | Freq: Four times a day (QID) | ORAL | 0 refills | Status: AC | PRN

## 2023-06-15 MED ORDER — ACETAMINOPHEN 325 MG PO TABS: 650.0000 mg | ORAL_TABLET | Freq: Four times a day (QID) | ORAL | 1 refills | Status: AC | PRN

## 2023-06-15 MED ORDER — DEXAMETHASONE SODIUM PHOSPHATE 10 MG/ML IJ SOLN
INTRAMUSCULAR | Status: DC | PRN
  Administered 2023-06-15: 10 mg via INTRAVENOUS

## 2023-06-15 MED ORDER — ONDANSETRON 4 MG PO TBDP: 4.0000 mg | ORAL_TABLET | Freq: Three times a day (TID) | ORAL | 0 refills | Status: DC | PRN

## 2023-06-15 MED ORDER — ORAL CARE MOUTH RINSE: 15.0000 mL | Freq: Once | OROMUCOSAL | Status: DC

## 2023-06-15 MED ORDER — OXYCODONE HCL 5 MG/5ML PO SOLN: 5.0000 mg | Freq: Once | ORAL | Status: AC | PRN

## 2023-06-15 MED ORDER — FENTANYL CITRATE (PF) 250 MCG/5ML IJ SOLN
INTRAMUSCULAR | Status: AC
  Filled 2023-06-15: qty 5

## 2023-06-15 MED ORDER — BUPIVACAINE HCL (PF) 0.25 % IJ SOLN
INTRAMUSCULAR | Status: DC | PRN
  Administered 2023-06-15: 40 mL

## 2023-06-15 MED ORDER — ONDANSETRON HCL 4 MG/2ML IJ SOLN
INTRAMUSCULAR | Status: DC | PRN
  Administered 2023-06-15: 8 mg via INTRAVENOUS

## 2023-06-15 MED ORDER — SUCCINYLCHOLINE CHLORIDE 20 MG/ML IJ SOLN
INTRAMUSCULAR | Status: DC | PRN
  Administered 2023-06-15: 120 mg via INTRAVENOUS

## 2023-06-15 MED ORDER — PROPOFOL 500 MG/50ML IV EMUL
INTRAVENOUS | Status: DC | PRN
  Administered 2023-06-15: 50 ug/kg/min via INTRAVENOUS

## 2023-06-15 MED ORDER — CHLORHEXIDINE GLUCONATE 0.12 % MT SOLN: 15.0000 mL | Freq: Once | OROMUCOSAL | Status: DC

## 2023-06-15 MED ORDER — FENTANYL CITRATE PF 50 MCG/ML IJ SOSY
25.0000 ug | PREFILLED_SYRINGE | INTRAMUSCULAR | Status: DC | PRN
Start: 2023-06-15 — End: 2023-06-15
  Administered 2023-06-15 (×2): 50 ug via INTRAVENOUS
  Filled 2023-06-15 (×2): qty 1

## 2023-06-15 MED ORDER — ROCURONIUM BROMIDE 100 MG/10ML IV SOLN
INTRAVENOUS | Status: DC | PRN
  Administered 2023-06-15: 60 mg via INTRAVENOUS

## 2023-06-15 MED ORDER — POVIDONE-IODINE 10 % EX SWAB: 2.0000 | Freq: Once | CUTANEOUS | Status: DC

## 2023-06-15 MED ORDER — STERILE WATER FOR IRRIGATION IR SOLN
Status: DC | PRN
  Administered 2023-06-15: 500 mL

## 2023-06-15 MED ORDER — ONDANSETRON HCL 4 MG/2ML IJ SOLN: 4.0000 mg | Freq: Once | INTRAMUSCULAR | Status: DC | PRN

## 2023-06-15 MED ORDER — MIDAZOLAM HCL 5 MG/5ML IJ SOLN
INTRAMUSCULAR | Status: DC | PRN
  Administered 2023-06-15: 4 mg via INTRAVENOUS

## 2023-06-15 SURGICAL SUPPLY — 31 items
BLADE SURG SZ11 CARB STEEL (BLADE) ×1 IMPLANT
CATH ROBINSON RED A/P 16FR (CATHETERS) IMPLANT
CHLORAPREP W/TINT 26 (MISCELLANEOUS) ×1 IMPLANT
CLOTH BEACON ORANGE TIMEOUT ST (SAFETY) ×1 IMPLANT
COVER LIGHT HANDLE STERIS (MISCELLANEOUS) ×2 IMPLANT
DERMABOND ADVANCED .7 DNX12 (GAUZE/BANDAGES/DRESSINGS) ×1 IMPLANT
GAUZE 4X4 16PLY ~~LOC~~+RFID DBL (SPONGE) ×2 IMPLANT
GLOVE BIO SURGEON STRL SZ 6.5 (GLOVE) ×1 IMPLANT
GLOVE BIOGEL PI IND STRL 7.0 (GLOVE) ×3 IMPLANT
GOWN STRL REUS W/ TWL LRG LVL3 (GOWN DISPOSABLE) ×2 IMPLANT
KIT TURNOVER CYSTO (KITS) ×1 IMPLANT
KIT TURNOVER KIT A (KITS) ×1 IMPLANT
NDL HYPO 21X1.5 SAFETY (NEEDLE) IMPLANT
NDL INSUFFLATION 14GA 120MM (NEEDLE) ×1 IMPLANT
NEEDLE HYPO 21X1.5 SAFETY (NEEDLE) ×1
NEEDLE INSUFFLATION 14GA 120MM (NEEDLE) ×1
PACK PERI GYN (CUSTOM PROCEDURE TRAY) ×1 IMPLANT
PAD ARMBOARD 7.5X6 YLW CONV (MISCELLANEOUS) ×1 IMPLANT
POSITIONER HEAD 8X9X4 ADT (SOFTGOODS) ×1 IMPLANT
SET BASIN LINEN APH (SET/KITS/TRAYS/PACK) ×1 IMPLANT
SET TUBE SMOKE EVAC HIGH FLOW (TUBING) ×1 IMPLANT
SHEARS HARMONIC ACE PLUS 36CM (ENDOMECHANICALS) ×1 IMPLANT
SLEEVE Z-THREAD 5X100MM (TROCAR) ×1 IMPLANT
SOL ANTI FOG 6CC (MISCELLANEOUS) ×1 IMPLANT
SUT MNCRL AB 4-0 PS2 18 (SUTURE) IMPLANT
SUT VICRYL 0 UR6 27IN ABS (SUTURE) IMPLANT
SYR 10ML LL (SYRINGE) ×1 IMPLANT
SYR CONTROL 10ML LL (SYRINGE) ×1 IMPLANT
TROCAR KII 8X100ML NONTHREADED (TROCAR) IMPLANT
TROCAR Z-THREAD FIOS 5X100MM (TROCAR) ×1 IMPLANT
WARMER LAPAROSCOPE (MISCELLANEOUS) ×1 IMPLANT

## 2023-06-15 NOTE — Op Note (Signed)
PREOPERATIVE DIAGNOSIS:  Desires sterilization POSTOPERATIVE DIAGNOSIS: same PROCEDURE PERFORMED: Laparoscopic bilateral salpingectomy SURGEON: Dr. Myna Hidalgo ANESTHESIA: General endotracheal.  ESTIMATED BLOOD LOSS: 5cc.  URINE OUTPUT: 20cc of clear yellow urine at the end of the procedure.  IV FLUIDS: 700 cc of crystalloid.  SPECIMEN(S): bilateral fallopian tubes COMPLICATIONS: None.  CONDITION: Stable.   FINDINGS: No ascites or peritoneal studding was appreciated.  Normal appearing bowel. Uterus normal size and shape.  Ovaries and tubes were normal in appearance.     Informed consent was obtained from the patient prior to taking her to the operating room where anesthesia was found to be adequate. She was placed in dorsal lithotomy position and examined under anesthesia. She was prepped and draped in normal sterile fashion. The bladder was drained under sterile technique.  A bi-valve speculum was then placed and the anterior lip of the cervix was grasped with the single tooth tenaculum. The hulka uterine manipulator was then advanced into the uterus to provide uterine mobility. The speculum and tenaculum were then removed.  Attention was then turned to the patient's abdomen.  A 5 mm umbilical skin incision was made with the scalpel. The veress needle was carefully introduced into the peritoneal cavity while tenting the abdominal wall. Intraperitoneal placement was confirmed by use of a saline-drop test.  The gas was connected and confirmed intrabdominal placement by a low initial pressure of . The abdomen was then insuflated with CO2 gas. The trocar and sleeve were then advanced without difficulty into the abdomen under direct visualization. Intraabdominal placement was confirmed by the laparoscope and surveillance of the abdomen was performed. Grossly normal appearing abdomen as mentioned in findings above.  Two additional ports were placed.  An 8mm trocar on her left lower quadrant and an  5mm at her right lower quadrent.  Each trocar was placed under direct visualization.      The right fallopian tube was placed on tension and using the Harmonic, the tube was excised and removed through the port under direct visualization.  A similar procedure was performed on the left with removal of the fallopian tube. Excellent hemostasis was noted.  Under direct visualization TAP block was completed under direct visualization using 10cc of 0.25% marcaine in each of four locations.  The instruments were then removed from the patients abdomen with air allowed to fully escape. All port sites were closed with monocryl and dermabond.  The manipulator was removed from the cervix, hemostasis was achieved with pressure and no lacerations identified. The patient tolerated the procedure well with all sponge, lap, and needle counts correct. The patient was taken to recovery in stable condition.  Myna Hidalgo, DO Attending Obstetrician & Gynecologist, [facility] for [employer], [facility]

## 2023-06-15 NOTE — Discharge Instructions (Addendum)
HOME INSTRUCTIONS  Please note any unusual or excessive bleeding, pain, swelling. Mild dizziness or drowsiness are normal for about 24 hours after surgery.   Shower when comfortable  Restrictions: No driving for 24 hours or while taking pain medications.  Activity:  No heavy lifting (> 15 lbs), nothing in vagina (no tampons, douching, or intercourse) x 4 weeks; no tub baths for 4 weeks Vaginal spotting is expected but if your bleeding is heavy, period like,  please call the office   Incision: the bandaids will fall off when they are ready to; you may clean your incision with mild soap and water but do not rub or scrub the incision site.  You may experience slight bloody drainage from your incision periodically.  This is normal.  If you experience a large amount of drainage or the incision opens, please call your physician who will likely direct you to the emergency department.  Diet:  You may return to your regular diet.  Do not eat large meals.  Eat small frequent meals throughout the day.  Continue to drink a good amount of water at least 6-8 glasses of water per day, hydration is very important for the healing process.  Pain Management: Take Ibuprofen every 6 hours with food as needed for pain.  Alternate ibuprofen with over the counter tylenol (acetaminophen) every 6-8 hours.  For moderate to severe pain, a prescription of Oxycodone has been sent in.  You can take this medication with the tylenol and alternate this medication with the ibuprofen. -If the  Oxycodone is too strong, just take the tylenol instead.    Always take prescription pain medication with food.  Oxycodone may cause constipation, be sure to drink plenty of water.   A prescription of colace (stool softener) has been sent in to take twice daily while taking the oxycodone.  Be sure to drink plenty of fluids and increase your fiber to help with constipation.  Alcohol -- Avoid for 24 hours and while taking pain  medications.  Nausea: Take sips of ginger ale or soda  Fever -- Call physician if temperature over 101 degrees  Follow up:  If you do not already have a follow up appointment scheduled, please call the office at [phone].  If you experience fever (a temperature greater than 100.4), pain unrelieved by pain medication, shortness of breath, swelling of a single leg, or any other symptoms which are concerning to you please the office immediately.

## 2023-06-15 NOTE — Transfer of Care (Signed)
Immediate Anesthesia Transfer of Care Note  Patient: Linda [name]  Procedure(s) Performed: LAPAROSCOPIC BILATERAL SALPINGECTOMY (Bilateral: Abdomen)  Patient Location: PACU  Anesthesia Type:General  Level of Consciousness: awake, alert , and oriented  Airway & Oxygen Therapy: Patient Spontanous Breathing  Post-op Assessment: Report given to RN and Post -op Vital signs reviewed and stable  Post vital signs: Reviewed and stable  Last Vitals:  Vitals Value Taken Time  BP 124/96 06/15/23 0927  Temp 98   Pulse 94 06/15/23 0928  Resp 19 06/15/23 0928  SpO2 98 % 06/15/23 0928  Vitals shown include unfiled device data.  Last Pain:  Vitals:   06/15/23 0716  TempSrc: Oral  PainSc: 1       Patients Stated Pain Goal: 8 (06/15/23 0716)  Complications: No notable events documented.

## 2023-06-15 NOTE — Anesthesia Preprocedure Evaluation (Signed)
Anesthesia Evaluation  Patient identified by MRN, date of birth, ID band Patient awake    Reviewed: Allergy & Precautions, H&P , NPO status , Patient's Chart, lab work & pertinent test results, reviewed documented beta blocker date and time   History of Anesthesia Complications (+) PONV and history of anesthetic complications  Airway Mallampati: II  TM Distance: >3 FB Neck ROM: full    Dental no notable dental hx.    Pulmonary neg pulmonary ROS, asthma , Patient abstained from smoking.   Pulmonary exam normal breath sounds clear to auscultation       Cardiovascular Exercise Tolerance: Good hypertension, negative cardio ROS  Rhythm:regular Rate:Normal     Neuro/Psych  PSYCHIATRIC DISORDERS Anxiety Depression     Neuromuscular disease negative neurological ROS  negative psych ROS   GI/Hepatic negative GI ROS, Neg liver ROS,GERD  ,,  Endo/Other  negative endocrine ROSdiabetesHypothyroidism    Renal/GU negative Renal ROS  negative genitourinary   Musculoskeletal   Abdominal   Peds  Hematology negative hematology ROS (+)   Anesthesia Other Findings   Reproductive/Obstetrics negative OB ROS                             Anesthesia Physical Anesthesia Plan  ASA: 2  Anesthesia Plan: General   Post-op Pain Management:    Induction:   PONV Risk Score and Plan: Propofol infusion  Airway Management Planned:   Additional Equipment:   Intra-op Plan:   Post-operative Plan:   Informed Consent: I have reviewed the patients History and Physical, chart, labs and discussed the procedure including the risks, benefits and alternatives for the proposed anesthesia with the patient or authorized representative who has indicated his/her understanding and acceptance.     Dental Advisory Given  Plan Discussed with: CRNA  Anesthesia Plan Comments:        Anesthesia Quick Evaluation

## 2023-06-15 NOTE — Anesthesia Procedure Notes (Addendum)
Procedure Name: Intubation Date/Time: 06/15/2023 8:39 AM  Performed by: Darryl Nestle, CRNAPre-anesthesia Checklist: Patient identified, Emergency Drugs available, Suction available and Patient being monitored Patient Re-evaluated:Patient Re-evaluated prior to induction Oxygen Delivery Method: Circle system utilized Preoxygenation: Pre-oxygenation with 100% oxygen Induction Type: IV induction and Rapid sequence Laryngoscope Size: Mac and 3 Grade View: Grade I Tube type: Oral Tube size: 6.5 mm Number of attempts: 1 Airway Equipment and Method: Stylet and Oral airway Placement Confirmation: ETT inserted through vocal cords under direct vision, positive ETCO2 and breath sounds checked- equal and bilateral Secured at: 20 cm Tube secured with: Tape Dental Injury: Teeth and Oropharynx as per pre-operative assessment

## 2023-06-16 NOTE — Anesthesia Postprocedure Evaluation (Signed)
Anesthesia Post Note  Patient: Linda [name]  Procedure(s) Performed: LAPAROSCOPIC BILATERAL SALPINGECTOMY (Bilateral: Abdomen)  Patient location during evaluation: Phase II Anesthesia Type: General Level of consciousness: awake Pain management: pain level controlled Vital Signs Assessment: post-procedure vital signs reviewed and stable Respiratory status: spontaneous breathing and respiratory function stable Cardiovascular status: blood pressure returned to baseline and stable Postop Assessment: no headache and no apparent nausea or vomiting Anesthetic complications: no Comments: Late entry   No notable events documented.   Last Vitals:  Vitals:   06/15/23 1030 06/15/23 1040  BP: 136/84 134/82  Pulse: 88 91  Resp: 14 12  Temp:  36.6 C  SpO2: 99% 98%    Last Pain:  Vitals:   06/16/23 1425  TempSrc:   PainSc: 0-No pain                 Windell Norfolk

## 2023-06-17 ENCOUNTER — Encounter (HOSPITAL_COMMUNITY): Payer: Self-pay | Admitting: Obstetrics & Gynecology

## 2023-06-17 LAB — SURGICAL PATHOLOGY

## 2023-06-18 DIAGNOSIS — E1065 Type 1 diabetes mellitus with hyperglycemia: Secondary | ICD-10-CM | POA: Diagnosis not present

## 2023-06-21 ENCOUNTER — Encounter: Payer: Self-pay | Admitting: *Deleted

## 2023-06-22 ENCOUNTER — Ambulatory Visit: Payer: Medicaid Other

## 2023-06-23 ENCOUNTER — Encounter: Payer: Self-pay | Admitting: Obstetrics & Gynecology

## 2023-06-23 ENCOUNTER — Ambulatory Visit: Payer: Medicaid Other | Admitting: Obstetrics & Gynecology

## 2023-06-23 VITALS — BP 132/88 | HR 108 | Wt 201.6 lb

## 2023-06-23 DIAGNOSIS — Z4889 Encounter for other specified surgical aftercare: Secondary | ICD-10-CM

## 2023-06-23 NOTE — Progress Notes (Signed)
    PostOp Visit Note  Linda [name] is a 22 y.o. G68P1001 female who presents for a postoperative visit. She is 1 week postop following a laparoscopic bilateral salpingectomy completed on 12/11  Today she notes that she is doing well.  She did need to take the opioids for the first week as needed, but now she is only taking Tylenol and ibuprofen. Denies fever or chills.  Tolerating gen diet.  +Flatus, Regular BMs.  Denies irregular vaginal bleeding Overall doing well and reports no acute complaints   Review of Systems Pertinent items are noted in HPI.    Objective:  BP 132/88 (BP Location: Left Arm, Patient Position: Sitting, Cuff Size: Large)   Pulse (!) 108   Wt 201 lb 9.6 oz (91.4 kg)   LMP  (LMP Unknown)   BMI 36.87 kg/m    Physical Examination:  GENERAL ASSESSMENT: well developed and well nourished SKIN: warm and dry CHEST: normal air exchange, respiratory effort normal with no retractions HEART: regular rate and rhythm ABDOMEN: soft, non-distended, +BS INCISION: healing appropriately with dermabond, ecchymosis noted LLQ EXTREMITY: no edema, no calf tenderness bilaterally PSYCH: mood appropriate, normal affect       Assessment:    postop   Plan:   -meeting milestones appropriately -reviewed pelvic rest -f/u prn or 33yr for annual  Myna Hidalgo, DO Attending Obstetrician & Gynecologist, [facility] [employer], [facility]

## 2023-07-05 DIAGNOSIS — E1065 Type 1 diabetes mellitus with hyperglycemia: Secondary | ICD-10-CM | POA: Diagnosis not present

## 2023-07-14 DIAGNOSIS — E1065 Type 1 diabetes mellitus with hyperglycemia: Secondary | ICD-10-CM | POA: Diagnosis not present

## 2023-07-15 ENCOUNTER — Encounter: Payer: Self-pay | Admitting: Family Medicine

## 2023-07-21 ENCOUNTER — Ambulatory Visit (HOSPITAL_COMMUNITY): Payer: Medicaid Other | Admitting: Clinical

## 2023-07-21 DIAGNOSIS — F419 Anxiety disorder, unspecified: Secondary | ICD-10-CM | POA: Diagnosis not present

## 2023-07-21 DIAGNOSIS — F331 Major depressive disorder, recurrent, moderate: Secondary | ICD-10-CM

## 2023-07-21 NOTE — Progress Notes (Signed)
Virtual Visit via Video Note   I connected with Linda [name] on 08/04/23 at  1:00 PM EDT by a video enabled telemedicine application and verified that I am speaking with the correct person using two identifiers.   Location: Patient: Home Provider: Office    I discussed the limitations of evaluation and management by telemedicine and the availability of in person appointments. The patient expressed understanding and agreed to proceed.     THERAPIST PROGRESS NOTE     Session Time: 1:00 PM-1:30 PM   Participation Level: Active   Behavioral Response: Casual and Alert,Depressed   Type of Therapy: Individual Therapy   Treatment Goals addressed: Depression and Anxiety   Interventions: CBT   Summary: Linda [name] a 23 y.o. female who presents with  Recurrent MDD with Anxiety. The OPT therapist worked with the patient for her OPT treatment. The OPT therapist utilized Motivational Interviewing to assist in creating therapeutic repore. The patient in the session was engaged and work in collaboration giving feedback about her triggers and symptoms over the past few weeks. The patient spoke about interactions and her support system over the past few weeks through the holidays and into the new year. The patient spoke  about her experiences interacting with her parents. The patient spoke about her ongoing work to manage boundaries with her family members. The patient spoke about her surgery having her tubes removed.. The OPT therapist utilized Cognitive Behavioral Therapy through cognitive restructuring as well as worked with the patient on coping strategies to assist in management of  body image issues, mood and anxiety. Additionally the OPT therapist worked with the patient on allowing positive messages to be received and did a in session exercise to reinforce , having positivity in her environment, and utilizing positive affirmations to assist in re-hardwiring her cognitive processes. The  OPT therapist reviewed as well with the patient basic need areas examining the patients current eating habits, sleep schedule, exercise, and hygiene. The patient spoke about starting a new in home exercise routine and having more focus on her diet.   Suicidal/Homicidal: Nowithout intent/plan   Therapist Response:The OPT therapist worked with the patient for the patients scheduled session. The patient was engaged in her session and gave feedback in relation to triggers, symptoms, and behavior responses over the past few weeks. The OPT therapist worked with the patient utilizing an in session Cognitive Behavioral Therapy exercise. The patient was responsive in the session and verbalized, " I had good holidays I was surprised but everyone did get along and so It was enjoyable and then when we got snow I had fun it was the first snow my daughter ever seen and my husband is from [place] so rarely seen snow before so we enjoyed our snow days ". The OPT therapist continued work with the patient on setting boundaries with her parents. The OPT therapist worked with the patient on managing her automatic negative thoughts.The patient spoke about willingness to be aware of automatic negative thoughts and to challenge them and worked completing a in session exercise around identifying, challenging , and filtering out negative not fact based thoughts about herself.The OPT therapist worked with the patient providing ongoing psycho-education. The OPT therapist worked with the patient evaluating her use of coping to balance her external stressors.  The patient spoke about her  surgery and recovery post having tubes removed.The OPT therapist will work with the patient in her next scheduled session.     Plan: return in 2/3  weeks    Diagnosis:      Axis I: Recurrent moderate  MDD with anxiety     Axis II: No diagnosis       Collaboration of Care: No additional collaboration for this session.   Patient/Guardian was  advised Release of Information must be obtained prior to any record release in order to collaborate their care with an outside provider. Patient/Guardian was advised if they have not already done so to contact the registration department to sign all necessary forms in order for [place] to release information regarding their care.    Consent: Patient/Guardian gives verbal consent for treatment and assignment of benefits for services provided during this visit. Patient/Guardian expressed understanding and agreed to proceed.    I discussed the assessment and treatment plan with the patient. The patient was provided an opportunity to ask questions and all were answered. The patient agreed with the plan and demonstrated an understanding of the instructions.   The patient was advised to call back or seek an in-person evaluation if the symptoms worsen or if the condition fails to improve as anticipated.   I provided 30 minutes of non-face-to-face time during this encounter.   Winfred Burn, LCSW   07/21/2023

## 2023-08-14 DIAGNOSIS — E1065 Type 1 diabetes mellitus with hyperglycemia: Secondary | ICD-10-CM | POA: Diagnosis not present

## 2023-08-15 ENCOUNTER — Ambulatory Visit (INDEPENDENT_AMBULATORY_CARE_PROVIDER_SITE_OTHER): Payer: Medicaid Other | Admitting: Clinical

## 2023-08-15 DIAGNOSIS — F331 Major depressive disorder, recurrent, moderate: Secondary | ICD-10-CM

## 2023-08-15 DIAGNOSIS — F419 Anxiety disorder, unspecified: Secondary | ICD-10-CM | POA: Diagnosis not present

## 2023-08-15 NOTE — Progress Notes (Signed)
 Virtual Visit via Video Note   I connected with Linda [name] on 08/15/23 at  1:00 PM EDT by a video enabled telemedicine application and verified that I am speaking with the correct person using two identifiers.   Location: Patient: Home Provider: Office    I discussed the limitations of evaluation and management by telemedicine and the availability of in person appointments. The patient expressed understanding and agreed to proceed.     THERAPIST PROGRESS NOTE     Session Time: 1:00 PM-1:45 PM   Participation Level: Active   Behavioral Response: Casual and Alert,Depressed   Type of Therapy: Individual Therapy   Treatment Goals addressed: Depression and Anxiety   Interventions: CBT   Summary: Linda [name] a 23 y.o. female who presents with  Recurrent MDD with Anxiety. The OPT therapist worked with the patient for her OPT treatment. The OPT therapist utilized Motivational Interviewing to assist in creating therapeutic repore. The patient in the session was engaged and work in collaboration giving feedback about her triggers and symptoms over the past few weeks. The patient spoke about interactions and her support system over the past few weeks through the holidays and into the new year. The patient spoke  about her experiences interacting with her parents and working to maintain boundaries. The OPT therapist utilized Cognitive Behavioral Therapy through cognitive restructuring as well as worked with the patient on coping strategies to assist in management of  body image issues, mood and anxiety. Additionally the OPT therapist worked with the patient on allowing positive messages to be received and did a in session exercise to reinforce , having positivity in her environment, and utilizing positive affirmations to assist in re-hardwiring her cognitive processes. The OPT therapist reviewed as well with the patient basic need areas examining the patients current eating habits,  sleep schedule, exercise, and hygiene. The patient spoke about continuing in home exercise routine and having more focus on her diet.   Suicidal/Homicidal: Nowithout intent/plan   Therapist Response:The OPT therapist worked with the patient for the patients scheduled session. The patient was engaged in her session and gave feedback in relation to triggers, symptoms, and behavior responses over the past few weeks. The OPT therapist worked with the patient utilizing an in session Cognitive Behavioral Therapy exercise. The patient was responsive in the session and verbalized, " I think like you said making sustainable lasting change and getting information first and doing things in a process way is helpful , because before I was rushing into change and not making it sustainable ". The OPT therapist continued work with the patient on setting boundaries with her parents. The OPT therapist worked with the patient on managing her automatic negative thoughts.The patient spoke about willingness to be aware of automatic negative thoughts and to challenge them and worked completing a in session exercise around identifying, challenging , and filtering out negative not fact based thoughts about herself.The OPT therapist worked with the patient providing ongoing psycho-education. The OPT therapist worked with the patient evaluating her use of coping to balance her external stressors.The OPT therapist will work with the patient in her next scheduled session.     Plan: return in 2/3 weeks    Diagnosis:      Axis I: Recurrent moderate  MDD with anxiety     Axis II: No diagnosis       Collaboration of Care: No additional collaboration for this session.   Patient/Guardian was advised Release of Information must be obtained  prior to any record release in order to collaborate their care with an outside provider. Patient/Guardian was advised if they have not already done so to contact the registration department to sign  all necessary forms in order for us  to release information regarding their care.    Consent: Patient/Guardian gives verbal consent for treatment and assignment of benefits for services provided during this visit. Patient/Guardian expressed understanding and agreed to proceed.    I discussed the assessment and treatment plan with the patient. The patient was provided an opportunity to ask questions and all were answered. The patient agreed with the plan and demonstrated an understanding of the instructions.   The patient was advised to call back or seek an in-person evaluation if the symptoms worsen or if the condition fails to improve as anticipated.   I provided 45 minutes of non-face-to-face time during this encounter.   Lea Primmer, LCSW   08/15/2023

## 2023-09-05 ENCOUNTER — Ambulatory Visit (INDEPENDENT_AMBULATORY_CARE_PROVIDER_SITE_OTHER): Payer: Medicaid Other | Admitting: Clinical

## 2023-09-05 DIAGNOSIS — F331 Major depressive disorder, recurrent, moderate: Secondary | ICD-10-CM | POA: Diagnosis not present

## 2023-09-05 DIAGNOSIS — F419 Anxiety disorder, unspecified: Secondary | ICD-10-CM | POA: Diagnosis not present

## 2023-09-05 NOTE — Progress Notes (Signed)
 Virtual Visit via Video Note   I connected with Linda [name] on 09/05/23 at  1:00 PM EDT by a video enabled telemedicine application and verified that I am speaking with the correct person using two identifiers.   Location: Patient: Home Provider: Office    I discussed the limitations of evaluation and management by telemedicine and the availability of in person appointments. The patient expressed understanding and agreed to proceed.     THERAPIST PROGRESS NOTE     Session Time: 1:00 PM-1:45 PM   Participation Level: Active   Behavioral Response: Casual and Alert,Depressed   Type of Therapy: Individual Therapy   Treatment Goals addressed: Depression and Anxiety   Interventions: CBT   Summary: Linda [name] a 23 y.o. female who presents with  Recurrent MDD with Anxiety. The OPT therapist worked with the patient for her OPT treatment. The OPT therapist utilized Motivational Interviewing to assist in creating therapeutic repore. The patient in the session was engaged and work in collaboration giving feedback about her triggers and symptoms over the past few weeks. The patient spoke about interactions and her support system over the past few weeks through the holidays and into the new year. The patient spoke  about her experiences interacting with her parents and working to maintain boundaries. The OPT therapist utilized Cognitive Behavioral Therapy through cognitive restructuring as well as worked with the patient on coping strategies to assist in management of  body image issues, mood and anxiety. Additionally the OPT therapist worked with the patient on mood management , awareness, postive coping, and projection. The OPT therapist reviewed as well with the patient basic need areas examining the patients current eating habits, sleep schedule, exercise, and hygiene. The patient spoke about continuing in home exercise routine and having more focus on her diet. The patient will be  talking with her PCP about adding med therapy for her mental health.   Suicidal/Homicidal: Nowithout intent/plan   Therapist Response:The OPT therapist worked with the patient for the patients scheduled session. The patient was engaged in her session and gave feedback in relation to triggers, symptoms, and behavior responses over the past few weeks. The OPT therapist worked with the patient utilizing an in session Cognitive Behavioral Therapy exercise. The patient was responsive in the session and verbalized, " I think going back on my mental health medicine could be helpful ". The OPT therapist continued work with the patient on setting boundaries with her parents. The OPT therapist worked with the patient on managing her automatic negative thoughts.The patient spoke about willingness to be aware of automatic negative thoughts and to challenge them and worked completing a in session exercise around identifying, challenging , and filtering out negative not fact based thoughts about herself.The OPT therapist worked with the patient providing ongoing psycho-education. The OPT therapist worked with the patient evaluating her use of coping to balance her external stressors. The OPT therapist worked with the patient on emotion/reactive behavior control.The OPT therapist will work with the patient in her next scheduled session.     Plan: return in 2/3 weeks    Diagnosis:      Axis I: Recurrent moderate  MDD with anxiety     Axis II: No diagnosis       Collaboration of Care: No additional collaboration for this session.   Patient/Guardian was advised Release of Information must be obtained prior to any record release in order to collaborate their care with an outside provider. Patient/Guardian was advised if  they have not already done so to contact the registration department to sign all necessary forms in order for [place] to release information regarding their care.    Consent: Patient/Guardian gives verbal  consent for treatment and assignment of benefits for services provided during this visit. Patient/Guardian expressed understanding and agreed to proceed.    I discussed the assessment and treatment plan with the patient. The patient was provided an opportunity to ask questions and all were answered. The patient agreed with the plan and demonstrated an understanding of the instructions.   The patient was advised to call back or seek an in-person evaluation if the symptoms worsen or if the condition fails to improve as anticipated.   I provided 45 minutes of non-face-to-face time during this encounter.   Winfred Burn, LCSW   09/05/2023

## 2023-09-20 DIAGNOSIS — E1065 Type 1 diabetes mellitus with hyperglycemia: Secondary | ICD-10-CM | POA: Diagnosis not present

## 2023-10-10 ENCOUNTER — Ambulatory Visit (INDEPENDENT_AMBULATORY_CARE_PROVIDER_SITE_OTHER): Admitting: Clinical

## 2023-10-10 DIAGNOSIS — F419 Anxiety disorder, unspecified: Secondary | ICD-10-CM | POA: Diagnosis not present

## 2023-10-10 DIAGNOSIS — F331 Major depressive disorder, recurrent, moderate: Secondary | ICD-10-CM | POA: Diagnosis not present

## 2023-10-10 NOTE — Progress Notes (Signed)
 Virtual Visit via Video Note   I connected with Linda [name] on 10/10/23 at  1:00 PM EDT by a video enabled telemedicine application and verified that I am speaking with the correct person using two identifiers.   Location: Patient: Home Provider: Office    I discussed the limitations of evaluation and management by telemedicine and the availability of in person appointments. The patient expressed understanding and agreed to proceed.     THERAPIST PROGRESS NOTE     Session Time: 1:00 PM-1:45 PM   Participation Level: Active   Behavioral Response: Casual and Alert,Depressed   Type of Therapy: Individual Therapy   Treatment Goals addressed: Depression and Anxiety   Interventions: CBT   Summary: Linda [name] a 23 y.o. female who presents with  Recurrent MDD with Anxiety. The OPT therapist worked with the patient for her OPT treatment. The OPT therapist utilized Motivational Interviewing to assist in creating therapeutic repore. The patient in the session was engaged and work in collaboration giving feedback about her triggers and symptoms over the past few weeks. The patient spoke about interactions and her support system over the past few weeks through March. The patient spoke  about her experiences interacting with her parents as well as grandparents and working to maintain boundaries. The OPT therapist utilized Cognitive Behavioral Therapy through cognitive restructuring as well as worked with the patient on coping strategies to assist in management of  body image issues, mood and anxiety. Additionally the OPT therapist worked with the patient on mood management , awareness, postive coping, and projection. The OPT therapist reviewed as well with the patient basic need areas examining the patients current eating habits, sleep schedule, exercise, and hygiene. The patient spoke about continuing in home exercise routine and having more focus on her diet. The patient will be talking  with her PCP about adding med therapy for her mental health.   Suicidal/Homicidal: Nowithout intent/plan   Therapist Response:The OPT therapist worked with the patient for the patients scheduled session. The patient was engaged in her session and gave feedback in relation to triggers, symptoms, and behavior responses over the past few weeks. The OPT therapist worked with the patient utilizing an in session Cognitive Behavioral Therapy exercise. The patient was responsive in the session and verbalized, " I think things have for the most part been ok I got into a argument with my grandparents ,but I think for the most part that has been resolved". The OPT therapist continued work with the patient on setting boundaries with her parents. The OPT therapist worked with the patient on managing her automatic negative thoughts.The patient spoke about willingness to be aware of automatic negative thoughts and to challenge them and worked completing a in session exercise around identifying, challenging , and filtering out negative not fact based thoughts about herself.The OPT therapist worked with the patient providing ongoing psycho-education. The OPT therapist worked with the patient evaluating her use of coping to balance her external stressors. The OPT therapist worked with the patient on emotion/reactive behavior control. The patient spoke about interest in starting med therapy and indicated her intent to speak with her PCP about this further. The OPT therapist will work with the patient in her next scheduled session.     Plan: return in 2/3 weeks    Diagnosis:      Axis I: Recurrent moderate  MDD with anxiety     Axis II: No diagnosis       Collaboration of Care:  No additional collaboration for this session.   Patient/Guardian was advised Release of Information must be obtained prior to any record release in order to collaborate their care with an outside provider. Patient/Guardian was advised if they  have not already done so to contact the registration department to sign all necessary forms in order for [place] to release information regarding their care.    Consent: Patient/Guardian gives verbal consent for treatment and assignment of benefits for services provided during this visit. Patient/Guardian expressed understanding and agreed to proceed.    I discussed the assessment and treatment plan with the patient. The patient was provided an opportunity to ask questions and all were answered. The patient agreed with the plan and demonstrated an understanding of the instructions.   The patient was advised to call back or seek an in-person evaluation if the symptoms worsen or if the condition fails to improve as anticipated.   I provided 45 minutes of non-face-to-face time during this encounter.   Winfred Burn, LCSW   10/10/2023

## 2023-11-14 ENCOUNTER — Ambulatory Visit (INDEPENDENT_AMBULATORY_CARE_PROVIDER_SITE_OTHER): Admitting: Clinical

## 2023-11-14 DIAGNOSIS — F331 Major depressive disorder, recurrent, moderate: Secondary | ICD-10-CM | POA: Diagnosis not present

## 2023-11-14 DIAGNOSIS — F419 Anxiety disorder, unspecified: Secondary | ICD-10-CM | POA: Diagnosis not present

## 2023-11-14 NOTE — Progress Notes (Signed)
 Virtual Visit via Video Note   I connected with Linda [name] on 11/14/23 at  1:00 PM EDT by a video enabled telemedicine application and verified that I am speaking with the correct person using two identifiers.   Location: Patient: Home Provider: Office    I discussed the limitations of evaluation and management by telemedicine and the availability of in person appointments. The patient expressed understanding and agreed to proceed.     THERAPIST PROGRESS NOTE     Session Time: 1:00 PM-1:45 PM   Participation Level: Active   Behavioral Response: Casual and Alert,Depressed   Type of Therapy: Individual Therapy   Treatment Goals addressed: Depression and Anxiety   Interventions: CBT   Summary: Linda [name] a 23 y.o. female who presents with  Recurrent MDD with Anxiety. The OPT therapist worked with the patient for her OPT treatment. The OPT therapist utilized Motivational Interviewing to assist in creating therapeutic repore. The patient in the session was engaged and work in collaboration giving feedback about her triggers and symptoms over the past few weeks. The patient spoke about last weekend having difficulty with heart papulations which led to her calling the EMS to be checked out , however, once the EMS came she was evaluated and released and told to follow up with Cardiologist. The patient spoke about interactions and her support system over the past few weeks through April. The patient spoke  about her experiences interacting with others. The OPT therapist utilized Cognitive Behavioral Therapy through cognitive restructuring as well as worked with the patient on coping strategies to assist in management of  body image issues, mood and anxiety. Additionally the OPT therapist worked with the patient on mood management , awareness, postive coping, and projection. The OPT therapist reviewed as well with the patient basic need areas examining the patients current eating  habits, sleep schedule, exercise, and hygiene. The patient spoke about continuing in home exercise routine and having more focus on her diet. The patient will be talking with her PCP about adding med therapy for her mental health. The patient in session practiced a anger management technique with the OPT.   Suicidal/Homicidal: Nowithout intent/plan   Therapist Response:The OPT therapist worked with the patient for the patients scheduled session. The patient was engaged in her session and gave feedback in relation to triggers, symptoms, and behavior responses over the past few weeks. The OPT therapist worked with the patient utilizing an in session Cognitive Behavioral Therapy exercise. The patient was responsive in the session and verbalized, " I am doing good health wise and I have been losing weight from my exercise". The OPT therapist continued work with the patient on setting boundaries with her parents. The OPT therapist worked with the patient on managing her automatic negative thoughts.The patient spoke about willingness to be aware of automatic negative thoughts and to challenge them and worked completing a in session exercise around identifying, challenging , and filtering out negative not fact based thoughts about herself.The OPT therapist worked with the patient providing ongoing psycho-education. The OPT therapist worked with the patient evaluating her use of coping to balance her external stressors. The OPT therapist worked with the patient on emotion/reactive behavior control. The patient spoke about her episode with palputations . The patient in session practiced a mood management control technique. The OPT therapist did overview with the patient consideration for mood stabilizer medication and patient will consider talking with her PCP further.The OPT therapist will work with the patient in  her next scheduled session.     Plan: return in 2/3 weeks    Diagnosis:      Axis I: Recurrent moderate   MDD with anxiety     Axis II: No diagnosis       Collaboration of Care: No additional collaboration for this session.   Patient/Guardian was advised Release of Information must be obtained prior to any record release in order to collaborate their care with an outside provider. Patient/Guardian was advised if they have not already done so to contact the registration department to sign all necessary forms in order for us  to release information regarding their care.    Consent: Patient/Guardian gives verbal consent for treatment and assignment of benefits for services provided during this visit. Patient/Guardian expressed understanding and agreed to proceed.    I discussed the assessment and treatment plan with the patient. The patient was provided an opportunity to ask questions and all were answered. The patient agreed with the plan and demonstrated an understanding of the instructions.   The patient was advised to call back or seek an in-person evaluation if the symptoms worsen or if the condition fails to improve as anticipated.   I provided 45 minutes of non-face-to-face time during this encounter.   Lea Primmer, LCSW   11/14/2023

## 2023-12-12 ENCOUNTER — Ambulatory Visit (INDEPENDENT_AMBULATORY_CARE_PROVIDER_SITE_OTHER)

## 2023-12-12 ENCOUNTER — Ambulatory Visit (INDEPENDENT_AMBULATORY_CARE_PROVIDER_SITE_OTHER): Admitting: Clinical

## 2023-12-12 DIAGNOSIS — F419 Anxiety disorder, unspecified: Secondary | ICD-10-CM

## 2023-12-12 DIAGNOSIS — F331 Major depressive disorder, recurrent, moderate: Secondary | ICD-10-CM | POA: Diagnosis not present

## 2023-12-12 DIAGNOSIS — E1065 Type 1 diabetes mellitus with hyperglycemia: Secondary | ICD-10-CM

## 2023-12-12 LAB — HM DIABETES EYE EXAM

## 2023-12-12 NOTE — Progress Notes (Signed)
 Linda [name] arrived 12/12/2023 and has given verbal consent to obtain images and complete their overdue diabetic retinal screening.  The images have been sent to an ophthalmologist or optometrist for review and interpretation.  Results will be sent back to Eliodoro Guerin, DO for review.  Patient has been informed they will be contacted when we receive the results via telephone or MyChart

## 2023-12-12 NOTE — Progress Notes (Signed)
 Virtual Visit via Video Note   I connected with Linda [name] on 12/12/23 at  1:00 PM EDT by a video enabled telemedicine application and verified that I am speaking with the correct person using two identifiers.   Location: Patient: Home Provider: Office    I discussed the limitations of evaluation and management by telemedicine and the availability of in person appointments. The patient expressed understanding and agreed to proceed.     THERAPIST PROGRESS NOTE     Session Time: 1:00 PM-1:45 PM   Participation Level: Active   Behavioral Response: Casual and Alert,Depressed   Type of Therapy: Individual Therapy   Treatment Goals addressed: Depression and Anxiety   Interventions: CBT   Summary: Linda [name] a 23 y.o. female who presents with  Recurrent MDD with Anxiety. The OPT therapist worked with the patient for her OPT treatment. The OPT therapist utilized Motivational Interviewing to assist in creating therapeutic repore. The patient in the session was engaged and work in collaboration giving feedback about her triggers and symptoms over the past few weeks. The patient spoke about being triggered by her friends baby delivery due to not hearing from her during the delivery leading her to catastrophize something bad happened, however, everything with her friends delivery worked out despite the baby initially being in the NICU. The patient spoke about interactions and her support system over the past few weeks through beginning of June . The patient spoke  about her experiences interacting with others. The OPT therapist utilized Cognitive Behavioral Therapy through cognitive restructuring as well as worked with the patient on coping strategies to assist in management of  body image issues, mood and anxiety. Additionally the OPT therapist worked with the patient on mood management , awareness, postive coping, and projection. The OPT therapist reviewed as well with the patient  basic need areas examining the patients current eating habits, sleep schedule, exercise, and hygiene. The patient spoke about continuing in home exercise routine and having more focus on her diet. The patient is still in consideration stage of talking with her PCP about adding med therapy for her mental health. The patient in session practiced a anger management technique with the OPT.   Suicidal/Homicidal: Nowithout intent/plan   Therapist Response:The OPT therapist worked with the patient for the patients scheduled session. The patient was engaged in her session and gave feedback in relation to triggers, symptoms, and behavior responses over the past few weeks. The OPT therapist worked with the patient utilizing an in session Cognitive Behavioral Therapy exercise. The patient was responsive in the session and verbalized, " I have been feeling like I have been suffering from sickness symptoms but I have noticed a pattern with eating seafood shell fish". The OPT therapist continued work with the patient on setting boundaries with her parents. The OPT therapist worked with the patient on managing her automatic negative thoughts.The patient spoke about willingness to be aware of automatic negative thoughts and to challenge them and worked completing a in session exercise around identifying, challenging , and filtering out negative not fact based thoughts about herself.The OPT therapist worked with the patient providing ongoing psycho-education. The OPT therapist worked with the patient evaluating her use of coping to balance her external stressors. The OPT therapist worked with the patient on emotion/reactive behavior control.  . The patient in session practiced a mood management control technique. The OPT therapist did overview with the patient consideration for mood stabilizer medication and patient will consider talking with  her PCP further. The OPT therapist did cover allergy testing to be followed up with the  patients PCP.The OPT therapist will work with the patient in her next scheduled session.     Plan: return in 2/3 weeks    Diagnosis:      Axis I: Recurrent moderate  MDD with anxiety     Axis II: No diagnosis       Collaboration of Care: No additional collaboration for this session.   Patient/Guardian was advised Release of Information must be obtained prior to any record release in order to collaborate their care with an outside provider. Patient/Guardian was advised if they have not already done so to contact the registration department to sign all necessary forms in order for us  to release information regarding their care.    Consent: Patient/Guardian gives verbal consent for treatment and assignment of benefits for services provided during this visit. Patient/Guardian expressed understanding and agreed to proceed.    I discussed the assessment and treatment plan with the patient. The patient was provided an opportunity to ask questions and all were answered. The patient agreed with the plan and demonstrated an understanding of the instructions.   The patient was advised to call back or seek an in-person evaluation if the symptoms worsen or if the condition fails to improve as anticipated.   I provided 45 minutes of non-face-to-face time during this encounter.   Lea Primmer, LCSW   12/12/2023

## 2023-12-29 ENCOUNTER — Encounter: Payer: Self-pay | Admitting: Nurse Practitioner

## 2023-12-29 ENCOUNTER — Ambulatory Visit: Admitting: Nurse Practitioner

## 2023-12-29 VITALS — BP 114/68 | HR 94 | Temp 98.5°F | Ht 62.0 in | Wt 196.2 lb

## 2023-12-29 DIAGNOSIS — H60313 Diffuse otitis externa, bilateral: Secondary | ICD-10-CM | POA: Insufficient documentation

## 2023-12-29 DIAGNOSIS — H9203 Otalgia, bilateral: Secondary | ICD-10-CM | POA: Diagnosis not present

## 2023-12-29 DIAGNOSIS — H9201 Otalgia, right ear: Secondary | ICD-10-CM | POA: Insufficient documentation

## 2023-12-29 MED ORDER — CIPROFLOXACIN-DEXAMETHASONE 0.3-0.1 % OT SUSP: 4.0000 [drp] | Freq: Two times a day (BID) | OTIC | 0 refills | Status: AC

## 2023-12-29 NOTE — Progress Notes (Signed)
 Acute Office Visit  Subjective:     Patient ID: Linda [name], female    DOB: [birth year], 23 y.o.   MRN: [id number]  Chief Complaint  Patient presents with   Ear Pain    Right ear pain, swollen, and red. Mild left ear pain. Symptoms started few days ago got worse yesterday     HPI Linda [name] is a 23 yrs old female present 12/29/2023 fro an acute visit concerns fro ear pain Ear Pain: Patient presents with right ear pain.  Symptoms include right ear pain. Symptoms began 2 days ago and are gradually worsening since that time  I put peroxide in it and makes it worst. Patient denies chills, dyspnea, fever, and nasal congestion. Ear history: 0 previous ear infections.   Active Ambulatory Problems    Diagnosis Date Noted   Asthma with acute exacerbation 09/21/2012   Type 1 diabetes (HCC) 06/04/2013   Acne vulgaris 10/29/2016   PTSD (post-traumatic stress disorder) 01/16/2018   Generalized anxiety disorder with panic attacks 01/16/2018   DKA (diabetic ketoacidosis) (HCC) 05/22/2018   Irritable bowel syndrome with constipation 12/08/2018   Abscess 07/08/2020   Bulimia nervosa 12/10/2020   Hypothyroid 12/15/2020   Mold exposure 03/25/2021   Vitamin D  deficiency 12/02/2020   Dizziness 04/08/2021   Agoraphobia 10/06/2022   Major depressive disorder, recurrent episode, moderate (HCC) 10/06/2022   History of suicide attempt 10/06/2022   History of non-suicidal self-harm in sustained remission 10/06/2022   Caffeine overuse 10/06/2022   Cannabis use disorder 10/06/2022   Multifactorial insomnia 10/06/2022   Fibromyalgia 10/06/2022   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   Pelvic pain 02/22/2023   Constipation 02/22/2023   Type 1 diabetes mellitus without complication, without long-term current use of insulin  (HCC) 05/25/2013   Hyperglycemia 05/24/2013   Encounter for sterilization 06/15/2023   Otalgia of both ears 12/29/2023   Acute  diffuse otitis externa of both ears 12/29/2023   Resolved Ambulatory Problems    Diagnosis Date Noted   Encounter to determine fetal viability of pregnancy 10/15/2020   Less than [redacted] weeks gestation of pregnancy 10/15/2020   Pregnancy examination or test, positive result 10/15/2020   Supervision of high-risk pregnancy 12/04/2020   Vaginal pain 01/21/2021   Pre-existing type 1 diabetes mellitus during pregnancy, antepartum 11/12/2020   History of gestational hypertension 05/23/2021   SVD (spontaneous vaginal delivery) 05/25/2021   Otalgia of right ear 12/29/2023   Past Medical History:  Diagnosis Date   Allergy    Anxiety    Asthma    Carpal tunnel syndrome    Depression    Diabetes mellitus without complication (HCC) 2014   GERD (gastroesophageal reflux disease)    Palpitations    PONV (postoperative nausea and vomiting)    Tachycardia    Tendonitis    Tinnitus     Review of Systems  Constitutional:  Negative for chills and fever.  HENT:  Positive for ear pain and tinnitus. Negative for hearing loss and sinus pain.   Respiratory:  Negative for cough, shortness of breath, wheezing and stridor.   Cardiovascular:  Negative for chest pain and leg swelling.  Gastrointestinal:  Negative for constipation, diarrhea, nausea and vomiting.  Skin:  Negative for itching and rash.  Neurological:  Negative for dizziness and headaches.   Negative unless indicated in HPI    Objective:    BP 114/68   Pulse 94   Temp 98.5 F (36.9 C) (Temporal)  Ht 5' 2 (1.575 m)   Wt 196 lb 3.2 oz (89 kg)   SpO2 96%   BMI 35.89 kg/m  BP Readings from Last 3 Encounters:  12/29/23 114/68  06/23/23 132/88  06/15/23 134/82   Wt Readings from Last 3 Encounters:  12/29/23 196 lb 3.2 oz (89 kg)  06/23/23 201 lb 9.6 oz (91.4 kg)  06/15/23 200 lb 9.9 oz (91 kg)      Physical Exam Vitals and nursing note reviewed.  HENT:     Head: Normocephalic and atraumatic.     Right Ear: Tenderness present.  No drainage. There is mastoid tenderness.     Left Ear: Hearing, tympanic membrane, ear canal and external ear normal. Tenderness present. Tympanic membrane is not erythematous.     Nose: Nose normal.   Eyes:     General: No scleral icterus.    Extraocular Movements: Extraocular movements intact.     Conjunctiva/sclera: Conjunctivae normal.     Pupils: Pupils are equal, round, and reactive to light.    Cardiovascular:     Heart sounds: Normal heart sounds.  Pulmonary:     Effort: Pulmonary effort is normal.     Breath sounds: Normal breath sounds.   Musculoskeletal:        General: Normal range of motion.     Right lower leg: No edema.     Left lower leg: No edema.   Skin:    General: Skin is warm and dry.     Findings: No rash.   Neurological:     Mental Status: She is alert and oriented to person, place, and time.   Psychiatric:        Mood and Affect: Mood normal.        Behavior: Behavior normal.        Thought Content: Thought content normal.        Judgment: Judgment normal.     No results found for any visits on 12/29/23.      Assessment & Plan:  Acute diffuse otitis externa of both ears -     Ciprofloxacin-dexAMETHasone ; Place 4 drops into both ears 2 (two) times daily for 7 days.  Dispense: 7.5 mL; Refill: 0  Otalgia of both ears -     Ciprofloxacin-dexAMETHasone ; Place 4 drops into both ears 2 (two) times daily for 7 days.  Dispense: 7.5 mL; Refill: 0    Linda [name] is a 23 yrs old caucasian female seen today fro ear pain, no acute distress  otitis externa: Ciprodex Bid -Pain relief: Use acetaminophen  or ibuprofen  as needed. -Keep the ear dry: Avoid swimming, bathing, or getting water  in the ear during treatment.  Prevention Tips: -Dry ears thoroughly after swimming or showering (tilt head, use a towel, or a hair dryer on low/cool setting). -Use ear plugs or a swim cap when swimming. -Consider using acetic acid ear drops (like homemade 1:1 vinegar and  rubbing alcohol) after swimming to help dry the ears--only if the eardrum is intact and not perforated. -Do not insert cotton swabs, fingers, or objects into the ear canal The above assessment and management plan was discussed with the patient. The patient verbalized understanding of and has agreed to the management plan. Patient is aware to call the clinic if they develop any new symptoms or if symptoms persist or worsen. Patient is aware when to return to the clinic for a follow-up visit. Patient educated on when it is appropriate to go to the emergency department.  Return if  symptoms worsen or fail to improve.  Dorreen Valiente [place] Thompson, DNP [facility] [address] [place], [place] [address] [phone]  Note: This document was prepared by [employer] voice dictation technology and any errors that results from this process are unintentional.

## 2024-01-09 ENCOUNTER — Ambulatory Visit (HOSPITAL_COMMUNITY): Admitting: Clinical

## 2024-01-10 ENCOUNTER — Ambulatory Visit: Payer: Self-pay | Admitting: Family Medicine

## 2024-03-22 DIAGNOSIS — Z4681 Encounter for fitting and adjustment of insulin pump: Secondary | ICD-10-CM | POA: Diagnosis not present

## 2024-03-22 DIAGNOSIS — E1065 Type 1 diabetes mellitus with hyperglycemia: Secondary | ICD-10-CM | POA: Diagnosis not present

## 2024-03-23 DIAGNOSIS — E1065 Type 1 diabetes mellitus with hyperglycemia: Secondary | ICD-10-CM | POA: Diagnosis not present

## 2024-04-11 LAB — LAB REPORT - SCANNED: Creatinine, POC: 46 mg/dL

## 2024-04-12 ENCOUNTER — Encounter (HOSPITAL_COMMUNITY): Payer: Self-pay | Admitting: Emergency Medicine

## 2024-04-12 ENCOUNTER — Ambulatory Visit: Payer: Self-pay

## 2024-04-12 ENCOUNTER — Emergency Department (HOSPITAL_COMMUNITY)
Admission: EM | Admit: 2024-04-12 | Discharge: 2024-04-12 | Disposition: A | Discharge status: Home / Self Care | Source: Ambulatory Visit | Attending: Emergency Medicine | Admitting: Emergency Medicine

## 2024-04-12 ENCOUNTER — Emergency Department (HOSPITAL_COMMUNITY)

## 2024-04-12 DIAGNOSIS — R1021 Pelvic and perineal pain right side: Secondary | ICD-10-CM | POA: Diagnosis not present

## 2024-04-12 DIAGNOSIS — E1065 Type 1 diabetes mellitus with hyperglycemia: Secondary | ICD-10-CM | POA: Diagnosis not present

## 2024-04-12 DIAGNOSIS — R102 Pelvic and perineal pain unspecified side: Secondary | ICD-10-CM

## 2024-04-12 DIAGNOSIS — J45909 Unspecified asthma, uncomplicated: Secondary | ICD-10-CM | POA: Insufficient documentation

## 2024-04-12 DIAGNOSIS — Z794 Long term (current) use of insulin: Secondary | ICD-10-CM | POA: Insufficient documentation

## 2024-04-12 DIAGNOSIS — R1031 Right lower quadrant pain: Secondary | ICD-10-CM | POA: Diagnosis not present

## 2024-04-12 LAB — COMPREHENSIVE METABOLIC PANEL WITH GFR
ALT: 10 U/L (ref 0–44)
AST: 17 U/L (ref 15–41)
Albumin: 4.4 g/dL (ref 3.5–5.0)
Alkaline Phosphatase: 60 U/L (ref 38–126)
Anion gap: 12 (ref 5–15)
BUN: 11 mg/dL (ref 6–20)
CO2: 25 mmol/L (ref 22–32)
Calcium: 9.9 mg/dL (ref 8.9–10.3)
Chloride: 101 mmol/L (ref 98–111)
Creatinine, Ser: 0.9 mg/dL (ref 0.44–1.00)
GFR, Estimated: >60 mL/min (ref >60)
Glucose, Bld: 213 mg/dL — ABNORMAL HIGH (ref 70–99)
Potassium: 4 mmol/L (ref 3.5–5.1)
Sodium: 138 mmol/L (ref 135–145)
Total Bilirubin: 0.4 mg/dL (ref 0.0–1.2)
Total Protein: 7.6 g/dL (ref 6.5–8.1)

## 2024-04-12 LAB — CBC WITH DIFFERENTIAL/PLATELET
Abs Immature Granulocytes: 0.02 K/uL (ref 0.00–0.07)
Basophils Absolute: 0.1 K/uL (ref 0.0–0.1)
Basophils Relative: 1 %
Eosinophils Absolute: 0.1 K/uL (ref 0.0–0.5)
Eosinophils Relative: 1 %
HCT: 42.6 % (ref 36.0–46.0)
Hemoglobin: 14.4 g/dL (ref 12.0–15.0)
Immature Granulocytes: 0 %
Lymphocytes Relative: 31 %
Lymphs Abs: 2.4 K/uL (ref 0.7–4.0)
MCH: 31.6 pg (ref 26.0–34.0)
MCHC: 33.8 g/dL (ref 30.0–36.0)
MCV: 93.4 fL (ref 80.0–100.0)
Monocytes Absolute: 0.4 K/uL (ref 0.1–1.0)
Monocytes Relative: 5 %
Neutro Abs: 5 K/uL (ref 1.7–7.7)
Neutrophils Relative %: 62 %
Platelets: 265 K/uL (ref 150–400)
RBC: 4.56 MIL/uL (ref 3.87–5.11)
RDW: 11.8 % (ref 11.5–15.5)
WBC: 7.9 K/uL (ref 4.0–10.5)
nRBC: 0 % (ref 0.0–0.2)

## 2024-04-12 LAB — URINALYSIS, ROUTINE W REFLEX MICROSCOPIC
Bilirubin Urine: NEGATIVE
Glucose, UA: 150 mg/dL — AB
Hgb urine dipstick: NEGATIVE
Ketones, ur: NEGATIVE mg/dL
Leukocytes,Ua: NEGATIVE
Nitrite: NEGATIVE
Protein, ur: NEGATIVE mg/dL
Specific Gravity, Urine: 1.01 (ref 1.005–1.030)
pH: 6 (ref 5.0–8.0)

## 2024-04-12 LAB — WET PREP, GENITAL
Sperm: NONE SEEN
Trich, Wet Prep: NONE SEEN
WBC, Wet Prep HPF POC: <10 (ref <10)
Yeast Wet Prep HPF POC: NONE SEEN

## 2024-04-12 LAB — LIPASE, BLOOD: Lipase: 16 U/L (ref 11–51)

## 2024-04-12 LAB — PREGNANCY, URINE: Preg Test, Ur: NEGATIVE

## 2024-04-12 MED ORDER — IOHEXOL 300 MG/ML  SOLN
100.0000 mL | Freq: Once | INTRAMUSCULAR | Status: AC | PRN
Start: 2024-04-12 — End: 2024-04-12
  Administered 2024-04-12: 100 mL via INTRAVENOUS

## 2024-04-12 MED ORDER — IBUPROFEN 800 MG PO TABS: 800.0000 mg | ORAL_TABLET | Freq: Three times a day (TID) | ORAL | 0 refills | Status: DC

## 2024-04-12 MED ORDER — ONDANSETRON HCL 4 MG/2ML IJ SOLN
4.0000 mg | Freq: Once | INTRAMUSCULAR | Status: AC
  Administered 2024-04-12: 4 mg via INTRAVENOUS
  Filled 2024-04-12 (×2): qty 2

## 2024-04-12 MED ORDER — MORPHINE SULFATE (PF) 2 MG/ML IV SOLN
2.0000 mg | Freq: Once | INTRAVENOUS | Status: AC
  Administered 2024-04-12: 2 mg via INTRAVENOUS
  Filled 2024-04-12: qty 1

## 2024-04-12 NOTE — ED Notes (Signed)
 Provider at bedside with RN chaperone for Pelvic exam.

## 2024-04-12 NOTE — ED Provider Notes (Signed)
 [place] EMERGENCY DEPARTMENT AT [facility] Provider Note   CSN: [id number] Arrival date & time: 04/12/24  1858     Patient presents with: Abdominal Pain   Linda [name] is a 23 y.o. female.    Abdominal Pain Associated symptoms: no chest pain, no chills, no dysuria, no fever, no hematuria, no nausea, no shortness of breath, no vaginal bleeding, no vaginal discharge and no vomiting         Linda [name] is a 23 y.o. female past medical history of PTSD, generalized anxiety, fibromyalgia, type 1 diabetes and has an insulin  pump, asthma, who presents to the Emergency Department complaining of right lower quadrant pain.  She notes history of pain of her right pelvic area that comes and goes with her menstrual cycles.  She has history of ruptured ovarian cyst in January.  Pain typically resolves after menstrual cycle has ended but she has had continued pain for 3 days.  Describes pain as feeling like a knot.  She denies any associated nausea or vomiting, diarrhea, fever, chills, flank pain, right lower extremity pain, or abnormal vaginal discharge.  Pain is not associated with food intake.  Surgical history includes bilateral tubal ligation.    Prior to Admission medications   Medication Sig Start Date End Date Taking? Authorizing Provider  cholecalciferol (VITAMIN D3) 25 MCG (1000 UNIT) tablet Take 1,000 Units by mouth 4 (four) times a week.    [provider]  Continuous Blood Gluc Sensor (DEXCOM G6 SENSOR) MISC SMARTSIG:Topical Every 10 Days 10/10/20   [provider]  Continuous Blood Gluc Transmit (DEXCOM G6 TRANSMITTER) MISC CHANGE every three MONTHS as directed with G6 Sensor 07/23/21   [provider]  glucagon 1 MG injection Inject 1 mg into the muscle once as needed (for blood sugar).    [provider]  ibuprofen  (ADVIL ) 600 MG tablet Take 1 tablet (600 mg total) by mouth every 6 (six) hours as needed. 06/15/23    Ozan, Jennifer, DO  insulin  aspart (NOVOLOG ) 100 UNIT/ML injection Inject 300 Units into the skin See admin instructions. Pt uses 300 units via dexcom every 3 days 08/31/21   [provider]  ondansetron  (ZOFRAN -ODT) 4 MG disintegrating tablet Take 1 tablet (4 mg total) by mouth every 8 (eight) hours as needed. 06/15/23   Ozan, Jennifer, DO  Vitamin D , Ergocalciferol , (DRISDOL ) 1.25 MG (50000 UNIT) CAPS capsule Take 1 capsule (50,000 Units total) by mouth every 7 (seven) days. X12 weeks.  Then start Vit D OTC 400 IU daily. 11/08/22   [name], DO    Allergies: Patient has no known allergies.    Review of Systems  Constitutional:  Negative for appetite change, chills and fever.  Respiratory:  Negative for shortness of breath.   Cardiovascular:  Negative for chest pain.  Gastrointestinal:  Positive for abdominal pain. Negative for nausea and vomiting.  Genitourinary:  Positive for menstrual problem and pelvic pain. Negative for dysuria, flank pain, hematuria, vaginal bleeding and vaginal discharge.  Neurological:  Negative for dizziness, weakness and numbness.    Updated Vital Signs BP 136/89 (BP Location: Left Arm)   Pulse 98   Temp 98.5 F (36.9 C) (Oral)   Resp 20   Ht 5' 2 (1.575 m)   Wt 90.7 kg   LMP 04/04/2024   SpO2 99%   BMI 36.58 kg/m   Physical Exam Vitals and nursing note reviewed. Exam conducted with a chaperone present.  Constitutional:  General: She is not in acute distress.    Appearance: She is well-developed. She is not toxic-appearing.  HENT:     Mouth/Throat:     Mouth: Mucous membranes are moist.  Cardiovascular:     Rate and Rhythm: Normal rate and regular rhythm.     Pulses: Normal pulses.  Pulmonary:     Effort: Pulmonary effort is normal.  Abdominal:     Palpations: Abdomen is soft.     Tenderness: There is abdominal tenderness.     Comments: Tender to palpation right lower quadrant.  Mild guarding noted.  Abdomen is soft.  No  CVA tenderness on exam  Genitourinary:    Cervix: No cervical motion tenderness, discharge or cervical bleeding.     Uterus: Normal. Not enlarged and not tender.      Adnexa:        Right: Tenderness present. No mass.         Left: No mass or tenderness.       Comments: Bimanual exam performed by me, minimal tenderness on palpation of the right adnexa.  No fullness or palpable mass.  No cervical motion tenderness or significant discharge in the vaginal vault Musculoskeletal:        General: Normal range of motion.  Skin:    General: Skin is warm.     Capillary Refill: Capillary refill takes less than 2 seconds.  Neurological:     General: No focal deficit present.     Mental Status: She is alert.     Sensory: No sensory deficit.     Motor: No weakness.     (all labs ordered are listed, but only abnormal results are displayed) Labs Reviewed  WET PREP, GENITAL - Abnormal; Notable for the following components:      Result Value   Clue Cells Wet Prep HPF POC PRESENT (*)    All other components within normal limits  COMPREHENSIVE METABOLIC PANEL WITH GFR - Abnormal; Notable for the following components:   Glucose, Bld 213 (*)    All other components within normal limits  URINALYSIS, ROUTINE W REFLEX MICROSCOPIC - Abnormal; Notable for the following components:   Color, Urine STRAW (*)    Glucose, UA 150 (*)    All other components within normal limits  CBC WITH DIFFERENTIAL/PLATELET  PREGNANCY, URINE  LIPASE, BLOOD  GC/CHLAMYDIA PROBE AMP ([place]) NOT AT [facility]    EKG: None  Radiology: CT ABDOMEN PELVIS W CONTRAST Result Date: 04/12/2024 CLINICAL DATA:  Pelvic and right lower quadrant abdominal pain starting a week ago. EXAM: CT ABDOMEN AND PELVIS WITH CONTRAST TECHNIQUE: Multidetector CT imaging of the abdomen and pelvis was performed using the standard protocol following bolus administration of intravenous contrast. RADIATION DOSE REDUCTION: This exam was performed  according to the departmental dose-optimization program which includes automated exposure control, adjustment of the mA and/or kV according to patient size and/or use of iterative reconstruction technique. CONTRAST:  OMNIPAQUE  IOHEXOL  300 MG/ML  SOLN COMPARISON:  Abdominal radiograph 01/26/2023. CT abdomen and pelvis 08/13/2018 FINDINGS: Lower chest: No acute abnormality. Hepatobiliary: No focal liver abnormality is seen. No gallstones, gallbladder wall thickening, or biliary dilatation. Pancreas: Unremarkable. No pancreatic ductal dilatation or surrounding inflammatory changes. Spleen: Normal in size without focal abnormality. Adrenals/Urinary Tract: Adrenal glands are unremarkable. Kidneys are normal, without renal calculi, focal lesion, or hydronephrosis. Bladder is unremarkable. Stomach/Bowel: Stomach, small bowel, stomach is normal. Small bowel and colon are decompressed. Colonic wall appears to be diffusely thickened with  mural edema. This could be due to under distention but could indicate colitis, possibly infectious or inflammatory in origin. There may also be some wall thickening in the terminal ileum. Mild left lower quadrant mesenteric edema. No pneumatosis or portal venous gas. No abscess. Appendix is normal. Vascular/Lymphatic: No significant vascular findings are present. No enlarged abdominal or pelvic lymph nodes. Reproductive: Uterus and bilateral adnexa are unremarkable. Other: No abdominal wall hernia or abnormality. No abdominopelvic ascites. Musculoskeletal: No acute or significant osseous findings. IMPRESSION: 1. Under distention limits evaluation but there appears to be diffuse wall thickening and edema throughout the colon and possibly also in the terminal ileum. This likely represents infectious or inflammatory enterocolitis. No abscess. 2. Appendix is normal. Electronically Signed   By: Elsie Gravely M.D.   On: 04/12/2024 21:35     Procedures   Medications Ordered in the ED   ondansetron  (ZOFRAN ) injection 4 mg (has no administration in time range)  morphine  (PF) 2 MG/ML injection 2 mg (has no administration in time range)                                    Medical Decision Making Patient here with right lower quadrant pain with history of same that typically resolve after menstrual cycle ends.  She has had persistent pain for 3 to 4 days to the same area.  No vaginal bleeding at this time.  She denies any vaginal discharge dysuria fever chills nausea or vomiting.  Nothing makes her symptoms better or worse.  Differential would include but not limited to acute appendicitis, ovarian cyst, ovarian torsion.  History of BTL, doubt ectopic pregnancy  Amount and/or Complexity of Data Reviewed Labs: ordered.    Details: Labs no evidence of leukocytosis, chemistries are reassuring.  Mildly hyperglycemic without elevated bicarb or anion gap.  Lipase unremarkable.  Wet prep shows clue cells without evidence of significant white cells yeast or trichomonas.  GC chlamydia pending Radiology: ordered.    Details: CT abdomen and pelvis shows wall thickening and edema throughout the colon which likely represents infectious or inflammatory enterocolitis.  There is no abscess.  Normal-appearing appendix.  Bilateral adnexa and uterus unremarkable. Discussion of management or test interpretation with external provider(s): On recheck, patient resting comfortably.  Has ambulated to the restroom twice without difficulty and her gait is steady.  CT imaging of the abdomen and pelvis notes some wall thickening and edema throughout the colon.  Likely representing infectious or inflammatory enterocolitis.  Patient does not have diarrhea nausea vomiting or fever no leukocytosis.  There is no exquisite tenderness on bimanual exam and I doubt this is a ovarian torsion.  I suspect patient's symptoms are related to the ovary and less likely that this is a true enterocolitis. Will treat patient's  symptoms, she is agreeable to close outpatient follow-up with [facility].  She was given return precautions  Risk Prescription drug management.        Final diagnoses:  Pelvic pain in female    ED Discharge Orders     None          Herlinda Milling, PA-C 04/12/24 [id number]    Cleotilde Rogue, MD 04/15/24 1450

## 2024-04-12 NOTE — Discharge Instructions (Signed)
 You can apply heating pad on and off to your right pelvic area as needed to help with pain.  I have also prescribed ibuprofen  for you to take for your symptoms.  Please call your OB/GYN tomorrow to arrange follow-up appointment.  Return to the emergency department for any new or worsening symptoms.

## 2024-04-12 NOTE — ED Triage Notes (Signed)
 Pt arrives via POV with c/o pelvic pain and RLQ abd pain starting a week ago, worsening the last 2-3 days. Pt reports hx of ruptured ovarian cyst in Jan. Denies emesis or vaginal bleeding.

## 2024-04-12 NOTE — ED Notes (Signed)
 Patient transported to CT

## 2024-04-12 NOTE — Telephone Encounter (Signed)
 FYI Only or Action Required?: FYI only for provider.  Patient was last seen in primary care on 12/29/2023 by Deitra Morton Sebastian Nena, NP.  Called Nurse Triage reporting Pain.  Symptoms began a week ago.  Interventions attempted: OTC medications: Tylenol .  Symptoms are: gradually worsening.  Triage Disposition: See HCP Within 4 Hours (Or PCP Triage)  Patient/caregiver understands and will follow disposition?: Yes  Copied from CRM [id number]. Topic: Clinical - Red Word Triage >> Apr 12, 2024  4:59 PM Kevelyn M wrote: Red Word that prompted transfer to Nurse Triage: Patient's grandmother calling in because patient is experiencing patient in lower right side and upper stomach. She's experiencing a sharp pain every few minutes and then it aches. Also she was experiencing pain in her lower pelvic since last week. Reason for Disposition  [1] MILD-MODERATE pain AND [2] constant AND [3] present > 2 hours  Answer Assessment - Initial Assessment Questions Advised to go to UC.  1. LOCATION: Where does it hurt?      Upper and lower abdomen and pelvis  2. RADIATION: Does the pain shoot anywhere else? (e.g., chest, back)     Upper and lower abdomen and pelvis  3. ONSET: When did the pain begin? (e.g., minutes, hours or days ago)      Ongoing mild symptoms since coming off of Depo-Provera  and having tubes tied in December of 2024. Significantly worse in the past week.  4. SUDDEN: Gradual or sudden onset?     Has gotten worse in the past week.  5. PATTERN Does the pain come and go, or is it constant?     Intermittent stabbing pain every few minutes in LRQ of abdomen, moderate ball like pain in pelvis and moderate aching pain in upper and lower stomach.  6. SEVERITY: How bad is the pain?  (e.g., Scale 1-10; mild, moderate, or severe)     6/10 pain  7. RECURRENT SYMPTOM: Have you ever had this type of stomach pain before? If Yes, ask: When was the last time? and What happened  that time?      Has happened in the past, was evaluated and told pt possibly has an ovarian cyst  8. CAUSE: What do you think is causing the stomach pain? (e.g., gallstones, recent abdominal surgery)     Possibly r/t ovarian cyst  9. RELIEVING/AGGRAVATING FACTORS: What makes it better or worse? (e.g., antacids, bending or twisting motion, bowel movement)     Period makes it worse. Severe cramping with last period.  10. OTHER SYMPTOMS: Do you have any other symptoms? (e.g., back pain, diarrhea, fever, urination pain, vomiting)       No. Denies vaginal bleeding or discharge  11. PREGNANCY: Is there any chance you are pregnant? When was your last menstrual period?       NO. LMP a few days ago.  Protocols used: Abdominal Pain - Female-A-AH

## 2024-04-13 NOTE — Telephone Encounter (Signed)
 FYI- WAS SEEN AT [facility] ON 10/9 FOR THIS

## 2024-04-16 LAB — GC/CHLAMYDIA PROBE AMP (~~LOC~~) NOT AT ARMC
Chlamydia: NEGATIVE
Comment: NEGATIVE
Comment: NORMAL
Neisseria Gonorrhea: NEGATIVE

## 2024-04-17 ENCOUNTER — Ambulatory Visit (INDEPENDENT_AMBULATORY_CARE_PROVIDER_SITE_OTHER): Admitting: Family Medicine

## 2024-04-17 ENCOUNTER — Encounter: Payer: Self-pay | Admitting: Family Medicine

## 2024-04-17 VITALS — BP 111/60 | HR 84 | Temp 98.3°F | Ht 62.0 in | Wt 202.0 lb

## 2024-04-17 DIAGNOSIS — F410 Panic disorder [episodic paroxysmal anxiety] without agoraphobia: Secondary | ICD-10-CM | POA: Diagnosis not present

## 2024-04-17 DIAGNOSIS — F411 Generalized anxiety disorder: Secondary | ICD-10-CM | POA: Diagnosis not present

## 2024-04-17 DIAGNOSIS — F331 Major depressive disorder, recurrent, moderate: Secondary | ICD-10-CM | POA: Diagnosis not present

## 2024-04-17 DIAGNOSIS — Z23 Encounter for immunization: Secondary | ICD-10-CM | POA: Diagnosis not present

## 2024-04-17 DIAGNOSIS — N946 Dysmenorrhea, unspecified: Secondary | ICD-10-CM | POA: Diagnosis not present

## 2024-04-17 DIAGNOSIS — F4000 Agoraphobia, unspecified: Secondary | ICD-10-CM

## 2024-04-17 DIAGNOSIS — R002 Palpitations: Secondary | ICD-10-CM | POA: Diagnosis not present

## 2024-04-17 MED ORDER — SERTRALINE HCL 50 MG PO TABS: 50.0000 mg | ORAL_TABLET | Freq: Every day | ORAL | 0 refills | Status: DC

## 2024-04-17 NOTE — Progress Notes (Signed)
 Subjective: CC: GAD/ agoraphobia PCP: [name], DO YEP:Rjddpz Linda [name] is a 23 y.o. female presenting to clinic today for:  GAD w/ agoraphobia Was previously under care of psychiatry.  However that provider has left our system and she sees counseling only.  She has been very regular with her therapy appointments but they feel like they have reached the max that they can help her with and now she is presenting to discuss medication again.  She has been previously treated with Prozac , Celexa , Pristiq  and BuSpar .  The latter she had brain zaps with so she discontinued this.  She has BTL in place for contraception.  She has been having some intermittent heart palpitations where she feels that her heart is pounding.  She has had previous evaluation for this.  She has been limiting caffeine intake and that does seem to help but does not resolve.  She admits she may not be hydrating adequately.  Blood sugars have been well-controlled with last A1c less than 7  She does report that she has very painful menstrual cycles and actually has several days of pelvic pain, particularly on the right side prior to her menstrual cycle.  She was previously treated with Depo-Provera .  She has an appoint with her OB/GYN at the end of the month.  She actually saw the ER for severe pain and had a CAT scan which did not demonstrate any reproductive abnormalities   ROS: Per HPI  No Known Allergies Past Medical History:  Diagnosis Date   Allergy    Anxiety    Asthma    Carpal tunnel syndrome    Depression    Diabetes mellitus without complication (HCC) 2014   Type 1   GERD (gastroesophageal reflux disease)    Palpitations    PONV (postoperative nausea and vomiting)    Tachycardia    Tendonitis    Tinnitus     Current Outpatient Medications:    Continuous Blood Gluc Sensor (DEXCOM G6 SENSOR) MISC, SMARTSIG:Topical Every 10 Days, Disp: , Rfl:    Continuous Blood Gluc Transmit (DEXCOM G6  TRANSMITTER) MISC, CHANGE every three MONTHS as directed with G6 Sensor, Disp: , Rfl:    glucagon 1 MG injection, Inject 1 mg into the muscle once as needed (for blood sugar)., Disp: , Rfl:    insulin  aspart (NOVOLOG ) 100 UNIT/ML injection, Inject 300 Units into the skin See admin instructions. Pt uses 300 units via dexcom every 3 days, Disp: , Rfl:    sertraline (ZOLOFT) 50 MG tablet, Take 1 tablet (50 mg total) by mouth daily., Disp: 30 tablet, Rfl: 0   cholecalciferol (VITAMIN D3) 25 MCG (1000 UNIT) tablet, Take 1,000 Units by mouth 4 (four) times a week. (Patient not taking: Reported on 04/17/2024), Disp: , Rfl:    ondansetron  (ZOFRAN -ODT) 4 MG disintegrating tablet, Take 1 tablet (4 mg total) by mouth every 8 (eight) hours as needed. (Patient not taking: Reported on 04/17/2024), Disp: 20 tablet, Rfl: 0   Vitamin D , Ergocalciferol , (DRISDOL ) 1.25 MG (50000 UNIT) CAPS capsule, Take 1 capsule (50,000 Units total) by mouth every 7 (seven) days. X12 weeks.  Then start Vit D OTC 400 IU daily. (Patient not taking: Reported on 04/17/2024), Disp: 12 capsule, Rfl: 0 Social History   Socioeconomic History   Marital status: Media planner    Spouse name: Not on file   Number of children: Not on file   Years of education: Not on file   Highest education level: 12th grade  Occupational History   Not on file  Tobacco Use   Smoking status: Never    Passive exposure: Yes   Smokeless tobacco: Never  Vaping Use   Vaping status: Every Day  Substance and Sexual Activity   Alcohol use: Yes    Comment: not often   Drug use: Yes    Types: Marijuana    Comment: every other day   Sexual activity: Yes    Birth control/protection: Injection  Other Topics Concern   Not on file  Social History Narrative   Not on file   Social Drivers of Health   Financial Resource Strain: Low Risk  (11/05/2022)   Overall Financial Resource Strain (CARDIA)    Difficulty of Paying Living Expenses: Not hard at all  Food  Insecurity: No Food Insecurity (11/05/2022)   Hunger Vital Sign    Worried About Running Out of Food in the Last Year: Never true    Ran Out of Food in the Last Year: Never true  Transportation Needs: No Transportation Needs (11/05/2022)   PRAPARE - Administrator, Civil Service (Medical): No    Lack of Transportation (Non-Medical): No  Physical Activity: Unknown (11/05/2022)   Exercise Vital Sign    Days of Exercise per Week: 0 days    Minutes of Exercise per Session: Not on file  Stress: Stress Concern Present (11/05/2022)   [employer] of Occupational Health - Occupational Stress Questionnaire    Feeling of Stress : Very much  Social Connections: Moderately Isolated (11/05/2022)   Social Connection and Isolation Panel    Frequency of Communication with Friends and Family: More than three times a week    Frequency of Social Gatherings with Friends and Family: More than three times a week    Attends Religious Services: Never    Database administrator or Organizations: No    Attends Engineer, structural: Not on file    Marital Status: Living with partner  Intimate Partner Violence: Unknown (10/08/2021)   Received from [facility]   HITS    Physically Hurt: Not on file    Insult or Talk Down To: Not on file    Threaten Physical Harm: Not on file    Scream or Curse: Not on file   Family History  Problem Relation Age of Onset   COPD Paternal Grandmother    Hypertension Paternal Grandmother    COPD Maternal Grandmother    Lumbar disc disease Maternal Grandmother    Dementia Maternal Grandmother    Heart attack Maternal Grandfather    Emphysema Maternal Grandfather    Ankylosing spondylitis Maternal Grandfather    Dementia Maternal Grandfather    Hypertension Mother    Thyroid  disease Mother    Asthma Brother    Thyroid  disease Brother     Objective: Office vital signs reviewed. BP 111/60   Pulse 84   Temp 98.3 F (36.8 C)   Ht 5' 2 (1.575 m)   Wt  202 lb (91.6 kg)   LMP 04/04/2024   SpO2 95%   BMI 36.95 kg/m   Physical Examination:  General: Awake, alert, well nourished, No acute distress HEENT: sclera white, MMM Cardio: regular rate and rhythm, S1S2 heard, no murmurs appreciated Pulm: clear to auscultation bilaterally, no wheezes, rhonchi or rales; normal work of breathing on room air    04/17/2024    2:12 PM 02/08/2023    1:49 PM 01/26/2023    8:42 AM  Depression screen PHQ 2/9  Decreased Interest 3 3 3   Down, Depressed, Hopeless 3 3 2   PHQ - 2 Score 6 6 5   Altered sleeping 1 3 3   Tired, decreased energy 3 3 3   Change in appetite 3 2 2   Feeling bad or failure about yourself  3 2 1   Trouble concentrating 3 0 3  Moving slowly or fidgety/restless 3 0 0  Suicidal thoughts 0 0 0  PHQ-9 Score 22 16 17   Difficult doing work/chores Extremely dIfficult Extremely dIfficult Extremely dIfficult      04/17/2024    2:13 PM 02/08/2023    1:49 PM 01/26/2023    8:42 AM 11/05/2022    3:18 PM  GAD 7 : Generalized Anxiety Score  Nervous, Anxious, on Edge 3 3 3 3   Control/stop worrying 3 3 3 3   Worry too much - different things 3 3 3 3   Trouble relaxing 3 3 3 3   Restless 3 3 3 3   Easily annoyed or irritable 3 3 3 3   Afraid - awful might happen 3 3 3 3   Total GAD 7 Score 21 21 21 21   Anxiety Difficulty Extremely difficult  Extremely difficult     Assessment/ Plan: 23 y.o. female   Generalized anxiety disorder with panic attacks - Plan: sertraline (ZOLOFT) 50 MG tablet  Major depressive disorder, recurrent episode, moderate (HCC) - Plan: sertraline (ZOLOFT) 50 MG tablet  Agoraphobia - Plan: sertraline (ZOLOFT) 50 MG tablet  Dysmenorrhea  Heart palpitations  Immunization due - Plan: Flu vaccine trivalent PF, 6mos and older(Flulaval,Afluria,Fluarix,Fluzone)   Sertraline ordered.  Sounds like with the norepinephrine reuptake inhibitor if she is having adverse side effects.  I doubt that this is actually coming from buspirone  but  will consider readding that medication if needed going forward.  We will follow-up in 1 month, sooner if concerns arise.  Continue counseling services.  Will consider propranolol given reports of intermittent heart palpitations it may be related to either anxiety versus inadequate hydration.  She has had previous Zio patch and done  Regarding her dysmenorrhea, I wonder if maybe she has some component of endometriosis given severe symptoms.  Again, CT demonstrated no reproductive abnormalities.  Keep appointment with Dr. Ozan.  Discussed she may offer IUD versus hormonal contraception to help with regulation and reduction in menstrual cycles   Norene CHRISTELLA Fielding, DO [facility] [phone]

## 2024-05-03 ENCOUNTER — Ambulatory Visit: Admitting: Obstetrics & Gynecology

## 2024-05-12 ENCOUNTER — Other Ambulatory Visit: Payer: Self-pay | Admitting: *Deleted

## 2024-05-12 DIAGNOSIS — F331 Major depressive disorder, recurrent, moderate: Secondary | ICD-10-CM

## 2024-05-12 DIAGNOSIS — F4000 Agoraphobia, unspecified: Secondary | ICD-10-CM

## 2024-05-12 DIAGNOSIS — F410 Panic disorder [episodic paroxysmal anxiety] without agoraphobia: Secondary | ICD-10-CM

## 2024-05-15 DIAGNOSIS — E1065 Type 1 diabetes mellitus with hyperglycemia: Secondary | ICD-10-CM | POA: Diagnosis not present

## 2024-05-21 ENCOUNTER — Encounter: Payer: Self-pay | Admitting: Family Medicine

## 2024-05-21 ENCOUNTER — Ambulatory Visit: Payer: Self-pay | Admitting: Family Medicine

## 2024-05-21 NOTE — Progress Notes (Deleted)
 Subjective: CC:*** PCP: Linda Norene HERO, DO YEP:Linda [name] is a 23 y.o. female presenting to clinic today for:  ***   ROS: Per HPI  No Known Allergies Past Medical History:  Diagnosis Date   Allergy    Anxiety    Asthma    Carpal tunnel syndrome    Depression    Diabetes mellitus without complication (HCC) 2014   Type 1   DKA (diabetic ketoacidosis) (HCC) 05/22/2018   IMO SNOMED Dx Update Oct 2024     Encounter for sterilization 06/15/2023   GERD (gastroesophageal reflux disease)    Palpitations    PONV (postoperative nausea and vomiting)    Tachycardia    Tendonitis    Tinnitus     Current Outpatient Medications:    cholecalciferol (VITAMIN D3) 25 MCG (1000 UNIT) tablet, Take 1,000 Units by mouth 4 (four) times a week. (Patient not taking: Reported on 04/17/2024), Disp: , Rfl:    Continuous Blood Gluc Sensor (DEXCOM G6 SENSOR) MISC, SMARTSIG:Topical Every 10 Days, Disp: , Rfl:    Continuous Blood Gluc Transmit (DEXCOM G6 TRANSMITTER) MISC, CHANGE every three MONTHS as directed with G6 Sensor, Disp: , Rfl:    glucagon 1 MG injection, Inject 1 mg into the muscle once as needed (for blood sugar)., Disp: , Rfl:    insulin  aspart (NOVOLOG ) 100 UNIT/ML injection, Inject 300 Units into the skin See admin instructions. Pt uses 300 units via dexcom every 3 days, Disp: , Rfl:    ondansetron  (ZOFRAN -ODT) 4 MG disintegrating tablet, Take 1 tablet (4 mg total) by mouth every 8 (eight) hours as needed. (Patient not taking: Reported on 04/17/2024), Disp: 20 tablet, Rfl: 0   sertraline (ZOLOFT) 50 MG tablet, TAKE 1 TABLET BY MOUTH EVERY DAY, Disp: 90 tablet, Rfl: 0   Vitamin D , Ergocalciferol , (DRISDOL ) 1.25 MG (50000 UNIT) CAPS capsule, Take 1 capsule (50,000 Units total) by mouth every 7 (seven) days. X12 weeks.  Then start Vit D OTC 400 IU daily. (Patient not taking: Reported on 04/17/2024), Disp: 12 capsule, Rfl: 0 Social History   Socioeconomic History   Marital  status: Media Planner    Spouse name: Not on file   Number of children: Not on file   Years of education: Not on file   Highest education level: 12th grade  Occupational History   Not on file  Tobacco Use   Smoking status: Never    Passive exposure: Yes   Smokeless tobacco: Never  Vaping Use   Vaping status: Every Day  Substance and Sexual Activity   Alcohol use: Yes    Comment: not often   Drug use: Yes    Types: Marijuana    Comment: every other day   Sexual activity: Yes    Birth control/protection: Injection  Other Topics Concern   Not on file  Social History Narrative   Not on file   Social Drivers of Health   Financial Resource Strain: Low Risk  (11/05/2022)   Overall Financial Resource Strain (CARDIA)    Difficulty of Paying Living Expenses: Not hard at all  Food Insecurity: No Food Insecurity (11/05/2022)   Hunger Vital Sign    Worried About Running Out of Food in the Last Year: Never true    Ran Out of Food in the Last Year: Never true  Transportation Needs: No Transportation Needs (11/05/2022)   PRAPARE - Administrator, Civil Service (Medical): No    Lack of Transportation (Non-Medical): No  Physical  Activity: Unknown (11/05/2022)   Exercise Vital Sign    Days of Exercise per Week: 0 days    Minutes of Exercise per Session: Not on file  Stress: Stress Concern Present (11/05/2022)   [employer] of Occupational Health - Occupational Stress Questionnaire    Feeling of Stress : Very much  Social Connections: Moderately Isolated (11/05/2022)   Social Connection and Isolation Panel    Frequency of Communication with Friends and Family: More than three times a week    Frequency of Social Gatherings with Friends and Family: More than three times a week    Attends Religious Services: Never    Database Administrator or Organizations: No    Attends Engineer, Structural: Not on file    Marital Status: Living with partner  Intimate Partner  Violence: Unknown (10/08/2021)   Received from [facility]   HITS    Physically Hurt: Not on file    Insult or Talk Down To: Not on file    Threaten Physical Harm: Not on file    Scream or Curse: Not on file   Family History  Problem Relation Age of Onset   COPD Paternal Grandmother    Hypertension Paternal Grandmother    COPD Maternal Grandmother    Lumbar disc disease Maternal Grandmother    Dementia Maternal Grandmother    Heart attack Maternal Grandfather    Emphysema Maternal Grandfather    Ankylosing spondylitis Maternal Grandfather    Dementia Maternal Grandfather    Hypertension Mother    Thyroid  disease Mother    Asthma Brother    Thyroid  disease Brother     Objective: Office vital signs reviewed. There were no vitals taken for this visit.  Physical Examination:  General: Awake, alert, *** nourished, No acute distress HEENT: Normal    Neck: No masses palpated. No lymphadenopathy    Ears: Tympanic membranes intact, normal light reflex, no erythema, no bulging    Eyes: PERRLA, extraocular membranes intact, sclera ***    Nose: nasal turbinates moist, *** nasal discharge    Throat: moist mucus membranes, no erythema, *** tonsillar exudate.  Airway is patent Cardio: regular rate and rhythm, S1S2 heard, no murmurs appreciated Pulm: clear to auscultation bilaterally, no wheezes, rhonchi or rales; normal work of breathing on room air GI: soft, non-tender, non-distended, bowel sounds present x4, no hepatomegaly, no splenomegaly, no masses GU: external vaginal tissue ***, cervix ***, *** punctate lesions on cervix appreciated, *** discharge from cervical os, *** bleeding, *** cervical motion tenderness, *** abdominal/ adnexal masses Extremities: warm, well perfused, No edema, cyanosis or clubbing; +*** pulses bilaterally MSK: *** gait and *** station Skin: dry; intact; no rashes or lesions Neuro: *** Strength and light touch sensation grossly intact, *** DTRs  ***/4  Assessment/ Plan: 23 y.o. female   No diagnosis found.   ***   Linda [name] Linda Fielding, DO [facility] [phone]

## 2024-05-22 ENCOUNTER — Encounter: Payer: Self-pay | Admitting: Family Medicine

## 2024-06-20 DIAGNOSIS — E1065 Type 1 diabetes mellitus with hyperglycemia: Secondary | ICD-10-CM | POA: Diagnosis not present

## 2024-08-01 ENCOUNTER — Ambulatory Visit: Admitting: Family Medicine

## 2024-08-02 ENCOUNTER — Ambulatory Visit: Admitting: Family Medicine

## 2024-08-02 ENCOUNTER — Encounter: Payer: Self-pay | Admitting: Family Medicine

## 2024-08-02 VITALS — BP 116/78 | HR 84 | Temp 98.0°F | Ht 62.0 in | Wt 194.0 lb

## 2024-08-02 DIAGNOSIS — L739 Follicular disorder, unspecified: Secondary | ICD-10-CM | POA: Diagnosis not present

## 2024-08-02 DIAGNOSIS — F411 Generalized anxiety disorder: Secondary | ICD-10-CM

## 2024-08-02 DIAGNOSIS — F331 Major depressive disorder, recurrent, moderate: Secondary | ICD-10-CM | POA: Diagnosis not present

## 2024-08-02 DIAGNOSIS — F410 Panic disorder [episodic paroxysmal anxiety] without agoraphobia: Secondary | ICD-10-CM | POA: Diagnosis not present

## 2024-08-02 DIAGNOSIS — F4000 Agoraphobia, unspecified: Secondary | ICD-10-CM

## 2024-08-02 DIAGNOSIS — L231 Allergic contact dermatitis due to adhesives: Secondary | ICD-10-CM | POA: Diagnosis not present

## 2024-08-02 MED ORDER — DOXYCYCLINE HYCLATE 100 MG PO TABS: 100.0000 mg | ORAL_TABLET | Freq: Two times a day (BID) | ORAL | 0 refills | Status: AC

## 2024-08-02 MED ORDER — SERTRALINE HCL 50 MG PO TABS: 50.0000 mg | ORAL_TABLET | Freq: Every day | ORAL | 3 refills | Status: AC

## 2024-08-02 MED ORDER — TRIAMCINOLONE ACETONIDE 0.1 % EX CREA: 1.0000 | TOPICAL_CREAM | Freq: Two times a day (BID) | CUTANEOUS | 0 refills | Status: AC

## 2024-08-02 MED ORDER — FLUCONAZOLE 150 MG PO TABS: 150.0000 mg | ORAL_TABLET | Freq: Once | ORAL | 0 refills | Status: AC

## 2024-08-02 NOTE — Progress Notes (Signed)
 "  Subjective: CC: Painful bump on breast PCP: [name], DO YEP:Rjddpz Linda [name] is a 24 y.o. female presenting to clinic today for:  Patient is accompanied today by her boyfriend.  She notes that she had developed a spot on the right side of her breast that was the size of the tip of her pinky finger.  She tried to burst it and subsequently caused a small hematoma which she ended up peeling skin off of in efforts to allow it to drain.  She notes that it was painful and she has been utilizing Neosporin and peroxide as well as some type of adhesive bandage.  The lesion did get slightly better but it is still present and she is worried about infection.  She notes now she has a red rash surrounding where the bandage was.  She reports that anxiety and depressive symptoms are back but she attributes this to having run out of Zoloft  50 mg.  It was helping at that dose and she like to restart it if possible.   ROS: Per HPI  Allergies[1] Past Medical History:  Diagnosis Date   Allergy    Anxiety    Asthma    Carpal tunnel syndrome    Depression    Diabetes mellitus without complication (HCC) 2014   Type 1   DKA (diabetic ketoacidosis) (HCC) 05/22/2018   IMO SNOMED Dx Update Oct 2024     Encounter for sterilization 06/15/2023   GERD (gastroesophageal reflux disease)    Palpitations    PONV (postoperative nausea and vomiting)    Tachycardia    Tendonitis    Tinnitus    Current Medications[2] Social History   Socioeconomic History   Marital status: Media Planner    Spouse name: Not on file   Number of children: Not on file   Years of education: Not on file   Highest education level: 12th grade  Occupational History   Not on file  Tobacco Use   Smoking status: Never    Passive exposure: Yes   Smokeless tobacco: Never  Vaping Use   Vaping status: Every Day  Substance and Sexual Activity   Alcohol use: Yes    Comment: not often   Drug use: Yes    Types:  Marijuana    Comment: every other day   Sexual activity: Yes    Birth control/protection: Injection  Other Topics Concern   Not on file  Social History Narrative   Not on file   Social Drivers of Health   Tobacco Use: Medium Risk (08/02/2024)   Patient History    Smoking Tobacco Use: Never    Smokeless Tobacco Use: Never    Passive Exposure: Yes  Financial Resource Strain: Low Risk (11/05/2022)   Overall Financial Resource Strain (CARDIA)    Difficulty of Paying Living Expenses: Not hard at all  Food Insecurity: No Food Insecurity (11/05/2022)   Hunger Vital Sign    Worried About Running Out of Food in the Last Year: Never true    Ran Out of Food in the Last Year: Never true  Transportation Needs: No Transportation Needs (11/05/2022)   PRAPARE - Administrator, Civil Service (Medical): No    Lack of Transportation (Non-Medical): No  Physical Activity: Unknown (11/05/2022)   Exercise Vital Sign    Days of Exercise per Week: 0 days    Minutes of Exercise per Session: Not on file  Stress: Stress Concern Present (11/05/2022)   [employer] of  Occupational Health - Occupational Stress Questionnaire    Feeling of Stress : Very much  Social Connections: Moderately Isolated (11/05/2022)   Social Connection and Isolation Panel    Frequency of Communication with Friends and Family: More than three times a week    Frequency of Social Gatherings with Friends and Family: More than three times a week    Attends Religious Services: Never    Database Administrator or Organizations: No    Attends Engineer, Structural: Not on file    Marital Status: Living with partner  Intimate Partner Violence: Unknown (10/08/2021)   Received from [facility]   HITS    Physically Hurt: Not on file    Insult or Talk Down To: Not on file    Threaten Physical Harm: Not on file    Scream or Curse: Not on file  Depression (PHQ2-9): High Risk (08/02/2024)   Depression (PHQ2-9)    PHQ-2 Score:  18  Alcohol Screen: Low Risk (11/05/2022)   Alcohol Screen    Last Alcohol Screening Score (AUDIT): 1  Housing: Low Risk (11/05/2022)   Housing    Last Housing Risk Score: 0  Utilities: Not on file  Health Literacy: Not on file   Family History  Problem Relation Age of Onset   COPD Paternal Grandmother    Hypertension Paternal Grandmother    COPD Maternal Grandmother    Lumbar disc disease Maternal Grandmother    Dementia Maternal Grandmother    Heart attack Maternal Grandfather    Emphysema Maternal Grandfather    Ankylosing spondylitis Maternal Grandfather    Dementia Maternal Grandfather    Hypertension Mother    Thyroid  disease Mother    Asthma Brother    Thyroid  disease Brother     Objective: Office vital signs reviewed. BP 116/78   Pulse 84   Temp 98 F (36.7 C) (Temporal)   Ht 5' 2 (1.575 m)   Wt 194 lb (88 kg)   SpO2 97%   BMI 35.48 kg/m   Physical Examination:  General: Awake, alert, well nourished, No acute distress Skin: She has an erythematous rash noted around a central lesion consistent with a contact dermatitis.  The lesion centrally is mildly inflamed without any palpable fluctuance, induration and no expressible material from the lesion.     08/02/2024    1:07 PM 04/17/2024    2:12 PM 02/08/2023    1:49 PM  Depression screen PHQ 2/9  Decreased Interest 2 3 3   Down, Depressed, Hopeless 2 3 3   PHQ - 2 Score 4 6 6   Altered sleeping 3 1 3   Tired, decreased energy 3 3 3   Change in appetite 2 3 2   Feeling bad or failure about yourself  3 3 2   Trouble concentrating 3 3 0  Moving slowly or fidgety/restless 0 3 0  Suicidal thoughts 0 0 0  PHQ-9 Score 18 22  16    Difficult doing work/chores Extremely dIfficult Extremely dIfficult Extremely dIfficult     Data saved with a previous flowsheet row definition      08/02/2024    1:07 PM 04/17/2024    2:13 PM 02/08/2023    1:49 PM 01/26/2023    8:42 AM  GAD 7 : Generalized Anxiety Score  Nervous, Anxious,  on Edge 3 3  3  3    Control/stop worrying 3 3  3  3    Worry too much - different things 3 3  3  3    Trouble relaxing  3 3  3  3    Restless 3 3  3  3    Easily annoyed or irritable 3 3  3  3    Afraid - awful might happen 3 3  3  3    Total GAD 7 Score 21 21 21 21   Anxiety Difficulty Extremely difficult Extremely difficult  Extremely difficult     Data saved with a previous flowsheet row definition    Assessment/ Plan: 24 y.o. female   Hair follicle infection - Plan: doxycycline  (VIBRA -TABS) 100 MG tablet, fluconazole  (DIFLUCAN ) 150 MG tablet  Generalized anxiety disorder with panic attacks - Plan: sertraline  (ZOLOFT ) 50 MG tablet  Major depressive disorder, recurrent episode, moderate (HCC) - Plan: sertraline  (ZOLOFT ) 50 MG tablet  Agoraphobia - Plan: sertraline  (ZOLOFT ) 50 MG tablet  Allergic contact dermatitis due to adhesives - Plan: triamcinolone  cream (KENALOG ) 0.1 %   Presumed hair follicle infection.  There is no palpable abscess or cyst on exam today.  I am going to cover her with antibiotics for the next 7 days and gave her Diflucan  if needed.  We discussed discontinuation of Neosporin and of peroxide.  Keep area clean with soap and water   For her anxiety, depression I have ordered her Zoloft .  I encouraged her to follow-up with me virtually in about 6 weeks and let me know if this is working well for her.  Additionally, the contact dermatitis will be treated with triamcinolone  topically.  Discussed avoidance of the central lesion so as not to impair healing   Norene CHRISTELLA Fielding, DO [facility] [phone]     [1] No Known Allergies [2]  Current Outpatient Medications:    Continuous Blood Gluc Sensor (DEXCOM G6 SENSOR) MISC, SMARTSIG:Topical Every 10 Days, Disp: , Rfl:    Continuous Blood Gluc Transmit (DEXCOM G6 TRANSMITTER) MISC, CHANGE every three MONTHS as directed with G6 Sensor, Disp: , Rfl:    doxycycline  (VIBRA -TABS) 100 MG tablet,  Take 1 tablet (100 mg total) by mouth 2 (two) times daily for 7 days., Disp: 14 tablet, Rfl: 0   fluconazole  (DIFLUCAN ) 150 MG tablet, Take 1 tablet (150 mg total) by mouth once for 1 dose., Disp: 1 tablet, Rfl: 0   glucagon 1 MG injection, Inject 1 mg into the muscle once as needed (for blood sugar)., Disp: , Rfl:    insulin  aspart (NOVOLOG ) 100 UNIT/ML injection, Inject 300 Units into the skin See admin instructions. Pt uses 300 units via dexcom every 3 days, Disp: , Rfl:    triamcinolone  cream (KENALOG ) 0.1 %, Apply 1 Application topically 2 (two) times daily. As needed for rash x5-7 days, Disp: 30 g, Rfl: 0   sertraline  (ZOLOFT ) 50 MG tablet, Take 1 tablet (50 mg total) by mouth daily., Disp: 100 tablet, Rfl: 3   Vitamin D , Ergocalciferol , (DRISDOL ) 1.25 MG (50000 UNIT) CAPS capsule, Take 1 capsule (50,000 Units total) by mouth every 7 (seven) days. X12 weeks.  Then start Vit D OTC 400 IU daily. (Patient not taking: Reported on 08/02/2024), Disp: 12 capsule, Rfl: 0  "
